# Patient Record
Sex: Female | Born: 1955 | Race: Black or African American | Hispanic: No | Marital: Single | Smoking: Former smoker
Health system: Southern US, Community
[De-identification: ages and names within clinical notes are randomized; demographics above are authoritative.]

## PROBLEM LIST (undated history)

## (undated) DIAGNOSIS — K222 Esophageal obstruction: Secondary | ICD-10-CM

## (undated) DIAGNOSIS — J309 Allergic rhinitis, unspecified: Secondary | ICD-10-CM

## (undated) DIAGNOSIS — T7840XA Allergy, unspecified, initial encounter: Secondary | ICD-10-CM

## (undated) DIAGNOSIS — J99 Respiratory disorders in diseases classified elsewhere: Secondary | ICD-10-CM

## (undated) DIAGNOSIS — B749 Filariasis, unspecified: Secondary | ICD-10-CM

## (undated) DIAGNOSIS — N390 Urinary tract infection, site not specified: Secondary | ICD-10-CM

## (undated) DIAGNOSIS — F32A Depression, unspecified: Secondary | ICD-10-CM

## (undated) DIAGNOSIS — F329 Major depressive disorder, single episode, unspecified: Secondary | ICD-10-CM

## (undated) DIAGNOSIS — K5140 Inflammatory polyps of colon without complications: Secondary | ICD-10-CM

## (undated) DIAGNOSIS — K219 Gastro-esophageal reflux disease without esophagitis: Secondary | ICD-10-CM

## (undated) DIAGNOSIS — R011 Cardiac murmur, unspecified: Secondary | ICD-10-CM

## (undated) HISTORY — DX: Allergy, unspecified, initial encounter: T78.40XA

## (undated) HISTORY — DX: Cardiac murmur, unspecified: R01.1

## (undated) HISTORY — DX: Respiratory disorders in diseases classified elsewhere: J99

## (undated) HISTORY — DX: Esophageal obstruction: K22.2

## (undated) HISTORY — DX: Major depressive disorder, single episode, unspecified: F32.9

## (undated) HISTORY — PX: UPPER GASTROINTESTINAL ENDOSCOPY: SHX188

## (undated) HISTORY — DX: Filariasis, unspecified: B74.9

## (undated) HISTORY — DX: Allergic rhinitis, unspecified: J30.9

## (undated) HISTORY — DX: Depression, unspecified: F32.A

## (undated) HISTORY — DX: Gastro-esophageal reflux disease without esophagitis: K21.9

## (undated) HISTORY — DX: Inflammatory polyps of colon without complications: K51.40

## (undated) HISTORY — DX: Urinary tract infection, site not specified: N39.0

## (undated) HISTORY — PX: COLONOSCOPY: SHX174

---
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
## 1998-10-13 ENCOUNTER — Encounter: Admission: RE | Admit: 1998-10-13 | Discharge: 1998-10-13 | Payer: Self-pay | Admitting: *Deleted

## 1999-08-10 ENCOUNTER — Emergency Department (HOSPITAL_COMMUNITY): Admission: EM | Admit: 1999-08-10 | Discharge: 1999-08-10 | Payer: Self-pay | Admitting: Emergency Medicine

## 1999-08-13 ENCOUNTER — Encounter: Admission: RE | Admit: 1999-08-13 | Discharge: 1999-08-13 | Payer: Self-pay | Admitting: Internal Medicine

## 1999-12-21 ENCOUNTER — Other Ambulatory Visit: Admission: RE | Admit: 1999-12-21 | Discharge: 1999-12-21 | Payer: Self-pay | Admitting: Obstetrics and Gynecology

## 2001-08-28 ENCOUNTER — Other Ambulatory Visit: Admission: RE | Admit: 2001-08-28 | Discharge: 2001-08-28 | Payer: Self-pay | Admitting: Obstetrics and Gynecology

## 2001-09-10 ENCOUNTER — Encounter: Payer: Self-pay | Admitting: Obstetrics and Gynecology

## 2001-09-10 ENCOUNTER — Ambulatory Visit (HOSPITAL_COMMUNITY): Admission: RE | Admit: 2001-09-10 | Discharge: 2001-09-10 | Payer: Self-pay | Admitting: Obstetrics and Gynecology

## 2003-05-05 ENCOUNTER — Emergency Department (HOSPITAL_COMMUNITY): Admission: EM | Admit: 2003-05-05 | Discharge: 2003-05-05 | Payer: Self-pay | Admitting: Emergency Medicine

## 2003-05-06 ENCOUNTER — Emergency Department (HOSPITAL_COMMUNITY): Admission: EM | Admit: 2003-05-06 | Discharge: 2003-05-06 | Payer: Self-pay | Admitting: Emergency Medicine

## 2003-05-13 ENCOUNTER — Encounter: Payer: Self-pay | Admitting: Gastroenterology

## 2003-05-13 ENCOUNTER — Ambulatory Visit (HOSPITAL_COMMUNITY): Admission: RE | Admit: 2003-05-13 | Discharge: 2003-05-13 | Payer: Self-pay | Admitting: Gastroenterology

## 2003-05-13 ENCOUNTER — Encounter (INDEPENDENT_AMBULATORY_CARE_PROVIDER_SITE_OTHER): Payer: Self-pay

## 2003-10-01 ENCOUNTER — Other Ambulatory Visit: Admission: RE | Admit: 2003-10-01 | Discharge: 2003-10-01 | Payer: Self-pay | Admitting: Obstetrics and Gynecology

## 2004-11-24 ENCOUNTER — Other Ambulatory Visit: Admission: RE | Admit: 2004-11-24 | Discharge: 2004-11-24 | Payer: Self-pay | Admitting: Obstetrics and Gynecology

## 2005-12-02 ENCOUNTER — Other Ambulatory Visit: Admission: RE | Admit: 2005-12-02 | Discharge: 2005-12-02 | Payer: Self-pay | Admitting: Obstetrics and Gynecology

## 2007-11-09 ENCOUNTER — Emergency Department (HOSPITAL_COMMUNITY): Admission: EM | Admit: 2007-11-09 | Discharge: 2007-11-09 | Payer: Self-pay | Admitting: Family Medicine

## 2008-05-21 ENCOUNTER — Emergency Department (HOSPITAL_COMMUNITY): Admission: EM | Admit: 2008-05-21 | Discharge: 2008-05-21 | Payer: Self-pay | Admitting: Emergency Medicine

## 2008-10-10 DIAGNOSIS — K5140 Inflammatory polyps of colon without complications: Secondary | ICD-10-CM

## 2008-10-10 HISTORY — DX: Inflammatory polyps of colon without complications: K51.40

## 2008-10-20 ENCOUNTER — Ambulatory Visit: Payer: Self-pay | Admitting: Gastroenterology

## 2008-11-03 ENCOUNTER — Ambulatory Visit: Payer: Self-pay | Admitting: Gastroenterology

## 2008-11-03 ENCOUNTER — Encounter: Payer: Self-pay | Admitting: Gastroenterology

## 2008-11-05 ENCOUNTER — Encounter: Payer: Self-pay | Admitting: Gastroenterology

## 2010-01-11 ENCOUNTER — Encounter: Admission: RE | Admit: 2010-01-11 | Discharge: 2010-01-11 | Payer: Self-pay | Admitting: Family Medicine

## 2011-03-24 ENCOUNTER — Other Ambulatory Visit: Payer: Self-pay | Admitting: Obstetrics and Gynecology

## 2011-07-08 LAB — POCT URINALYSIS DIP (DEVICE)
Bilirubin Urine: NEGATIVE
Glucose, UA: NEGATIVE
Hgb urine dipstick: NEGATIVE
Ketones, ur: NEGATIVE
Nitrite: NEGATIVE
Operator id: 239701
Protein, ur: NEGATIVE
Specific Gravity, Urine: 1.01
Urobilinogen, UA: 0.2
pH: 6

## 2011-11-11 ENCOUNTER — Telehealth: Payer: Self-pay

## 2011-11-11 NOTE — Telephone Encounter (Signed)
Dr. Alwyn Ren: Pt wanted [place] to be aware that [facility] is trying to contact us about medication refills. cdc

## 2011-11-15 ENCOUNTER — Other Ambulatory Visit: Payer: Self-pay

## 2011-11-15 MED ORDER — ESOMEPRAZOLE MAGNESIUM 40 MG PO CPDR: 40.0000 mg | DELAYED_RELEASE_CAPSULE | Freq: Every day | ORAL | Status: DC

## 2012-03-01 ENCOUNTER — Encounter: Payer: Self-pay | Admitting: Family Medicine

## 2012-03-01 ENCOUNTER — Telehealth: Payer: Self-pay | Admitting: *Deleted

## 2012-03-01 ENCOUNTER — Ambulatory Visit (INDEPENDENT_AMBULATORY_CARE_PROVIDER_SITE_OTHER): Payer: BC Managed Care – PPO | Admitting: Family Medicine

## 2012-03-01 VITALS — BP 125/82 | HR 74 | Temp 98.2°F | Ht 61.0 in | Wt 133.4 lb

## 2012-03-01 DIAGNOSIS — R51 Headache: Secondary | ICD-10-CM

## 2012-03-01 DIAGNOSIS — J309 Allergic rhinitis, unspecified: Secondary | ICD-10-CM

## 2012-03-01 DIAGNOSIS — J302 Other seasonal allergic rhinitis: Secondary | ICD-10-CM | POA: Insufficient documentation

## 2012-03-01 DIAGNOSIS — R519 Headache, unspecified: Secondary | ICD-10-CM | POA: Insufficient documentation

## 2012-03-01 MED ORDER — PREDNISONE 20 MG PO TABS: ORAL_TABLET | ORAL | Status: DC

## 2012-03-01 MED ORDER — FLUTICASONE PROPIONATE 50 MCG/ACT NA SUSP: 2.0000 | Freq: Every day | NASAL | Status: DC

## 2012-03-01 NOTE — Assessment & Plan Note (Signed)
New.  No evidence of infxn on PE.  No abx.  Start Prednisone for inflammation.  Reviewed supportive care and red flags that should prompt return.  Pt expressed understanding and is in agreement w/ plan.

## 2012-03-01 NOTE — Telephone Encounter (Signed)
Called pt to clarify concern for medication prescribed today, unable to speak to pt, left vm to call office

## 2012-03-01 NOTE — Telephone Encounter (Signed)
Pt left msg on triage vmail with questions about the prescription she was prescribed today.

## 2012-03-01 NOTE — Progress Notes (Signed)
  Subjective:    Patient ID: Sandra Curtis, female    DOB: [birth year], 56 y.o.   MRN: [id number]  HPI New to establish.  Previous MD- Hooper ([facility]) GYN- [facility]  HAs- chronic problem, was prescribed Mobic, Fiorcet by [facility] w/out relief.  Was also started on Zoloft for preventative but pt did not take this.  Had CT scan last year for similar sxs and had sinus infxn.  sxs returned last week w/ severe, daily HA.  HA is behind the eyes.  Again having dizziness.  No ear pain.  + nasal congestion.  No fevers.  No nausea.  No photo or phonophobia.   Review of Systems For ROS see HPI     Objective:   Physical Exam  Vitals reviewed. Constitutional: She appears well-developed and well-nourished. No distress.  HENT:  Head: Normocephalic and atraumatic.  Right Ear: Tympanic membrane normal.  Left Ear: Tympanic membrane normal.  Nose: Mucosal edema and rhinorrhea present. Right sinus exhibits no maxillary sinus tenderness and no frontal sinus tenderness. Left sinus exhibits no maxillary sinus tenderness and no frontal sinus tenderness.  Mouth/Throat: Mucous membranes are normal. Posterior oropharyngeal erythema (w/ PND) present.  Eyes: Conjunctivae and EOM are normal. Pupils are equal, round, and reactive to light.  Neck: Normal range of motion. Neck supple.  Cardiovascular: Normal rate, regular rhythm, normal heart sounds and intact distal pulses.   Pulmonary/Chest: Effort normal and breath sounds normal. No respiratory distress. She has no wheezes. She has no rales.  Musculoskeletal: She exhibits no edema.  Lymphadenopathy:    She has no cervical adenopathy.          Assessment & Plan:

## 2012-03-01 NOTE — Patient Instructions (Signed)
Schedule your complete physical at your convenience- don't eat before this appt Start Claritin or Zyrtec daily for seasonal allergies Use the nasal spray- 2 sprays each nostril- daily Take the Prednisone- 2 tabs at the same time- w/ food to improve the pain/inflammation You can add tylenol throughout the day if your head hurts- don't take excedrin/aspirin/ibuprofen while on the prednisone Call with any questions or concerns Welcome!  We're glad to have you!!!

## 2012-03-01 NOTE — Assessment & Plan Note (Signed)
New.  Likely the cause of pt's daily HAs.  Start OTC antihistamine, steroid nasal spray.  Reviewed supportive care and red flags that should prompt return.  Pt expressed understanding and is in agreement w/ plan.

## 2012-03-07 NOTE — Telephone Encounter (Signed)
Called pt to clarify question about her prescription, left vm to call office

## 2012-03-13 NOTE — Telephone Encounter (Signed)
.  left message to have patient return my call Mailed letter to advise if the pt still has a concern with her prescription to please contact our office

## 2012-07-11 ENCOUNTER — Encounter: Payer: Self-pay | Admitting: Family Medicine

## 2012-07-11 ENCOUNTER — Ambulatory Visit (INDEPENDENT_AMBULATORY_CARE_PROVIDER_SITE_OTHER): Payer: BC Managed Care – PPO | Admitting: Family Medicine

## 2012-07-11 VITALS — BP 110/77 | HR 87 | Temp 98.4°F | Ht 61.0 in | Wt 128.2 lb

## 2012-07-11 DIAGNOSIS — Z0000 Encounter for general adult medical examination without abnormal findings: Secondary | ICD-10-CM | POA: Insufficient documentation

## 2012-07-11 LAB — LIPID PANEL: VLDL: 11.6 mg/dL (ref 0.0–40.0)

## 2012-07-11 LAB — HEPATIC FUNCTION PANEL
ALT: 18 U/L (ref 0–35)
AST: 32 U/L (ref 0–37)
Alkaline Phosphatase: 59 U/L (ref 39–117)
Bilirubin, Direct: 0.1 mg/dL (ref 0.0–0.3)
Total Protein: 7.5 g/dL (ref 6.0–8.3)

## 2012-07-11 LAB — CBC WITH DIFFERENTIAL/PLATELET
Basophils Relative: 0.9 % (ref 0.0–3.0)
Eosinophils Relative: 1.9 % (ref 0.0–5.0)
Hemoglobin: 11.3 g/dL — ABNORMAL LOW (ref 12.0–15.0)
Lymphocytes Relative: 54.6 % — ABNORMAL HIGH (ref 12.0–46.0)
Neutro Abs: 2.7 10*3/uL (ref 1.4–7.7)
Neutrophils Relative %: 36.4 % — ABNORMAL LOW (ref 43.0–77.0)
RBC: 3.71 Mil/uL — ABNORMAL LOW (ref 3.87–5.11)
WBC: 7.4 10*3/uL (ref 4.5–10.5)

## 2012-07-11 LAB — BASIC METABOLIC PANEL
BUN: 14 mg/dL (ref 6–23)
Calcium: 9 mg/dL (ref 8.4–10.5)
Creatinine, Ser: 0.7 mg/dL (ref 0.4–1.2)
GFR: 107.64 mL/min (ref >60.00)

## 2012-07-11 LAB — TSH: TSH: 0.53 u[IU]/mL (ref 0.35–5.50)

## 2012-07-11 NOTE — Progress Notes (Signed)
  Subjective:    Patient ID: Sandra Curtis, female    DOB: 1956-09-22, 56 y.o.   MRN: [id number]  HPI CPE- UTD on mammo and pap w/ Dr Tenny Craw (GYN), colonoscopy 2010 w/ Dr Russella Dar.   Review of Systems Patient reports no vision/ hearing changes, adenopathy,fever, weight change,  persistant/recurrent hoarseness , swallowing issues, chest pain, palpitations, edema, persistant/recurrent cough, hemoptysis, dyspnea (rest/exertional/paroxysmal nocturnal), gastrointestinal bleeding (melena, rectal bleeding), significant heartburn, bowel changes, GU symptoms (dysuria, hematuria, incontinence), Gyn symptoms (abnormal  bleeding, pain),  syncope, focal weakness, memory loss, numbness & tingling, skin/hair/nail changes, abnormal bruising or bleeding, anxiety, or depression.   + chronic constipation, gas pain    Objective:   Physical Exam General Appearance:    Alert, cooperative, no distress, appears stated age  Head:    Normocephalic, without obvious abnormality, atraumatic  Eyes:    PERRL, conjunctiva/corneas clear, EOM's intact, fundi    benign, both eyes  Ears:    Normal TM's and external ear canals, both ears  Nose:   Nares normal, septum midline, mucosa normal, no drainage    or sinus tenderness  Throat:   Lips, mucosa, and tongue normal; teeth and gums normal  Neck:   Supple, symmetrical, trachea midline, no adenopathy;    Thyroid: no enlargement/tenderness/nodules  Back:     Symmetric, no curvature, ROM normal, no CVA tenderness  Lungs:     Clear to auscultation bilaterally, respirations unlabored  Chest Wall:    No tenderness or deformity   Heart:    Regular rate and rhythm, S1 and S2 normal, no murmur, rub   or gallop  Breast Exam:    Deferred to GYN  Abdomen:     Soft, non-tender, bowel sounds active all four quadrants,    no masses, no organomegaly  Genitalia:    Deferred to GYN  Rectal:    Extremities:   Extremities normal, atraumatic, no cyanosis or edema  Pulses:   2+ and  symmetric all extremities  Skin:   Skin color, texture, turgor normal, no rashes or lesions  Lymph nodes:   Cervical, supraclavicular, and axillary nodes normal  Neurologic:   CNII-XII intact, normal strength, sensation and reflexes    throughout          Assessment & Plan:

## 2012-07-11 NOTE — Patient Instructions (Addendum)
Follow up in 1 year or as needed Keep up the good work! We'll notify you of your lab results and make any changes if needed Call with any questions or concerns Happy Fall!!!

## 2012-07-11 NOTE — Assessment & Plan Note (Signed)
Pt's PE WNL.  UTD on colonoscopy, GYN.  Declined flu shot.  EKG done- see document for interpretation.  Check labs.  Anticipatory guidance provided.

## 2012-07-12 ENCOUNTER — Encounter: Payer: Self-pay | Admitting: *Deleted

## 2012-07-15 LAB — VITAMIN D 1,25 DIHYDROXY
Vitamin D 1, 25 (OH)2 Total: 72 pg/mL (ref 18–72)
Vitamin D2 1, 25 (OH)2: <8 pg/mL
Vitamin D3 1, 25 (OH)2: 72 pg/mL

## 2012-10-10 LAB — HM COLONOSCOPY

## 2013-04-17 ENCOUNTER — Other Ambulatory Visit: Payer: Self-pay | Admitting: Obstetrics and Gynecology

## 2013-08-08 ENCOUNTER — Ambulatory Visit: Payer: Self-pay | Admitting: Family Medicine

## 2013-10-01 ENCOUNTER — Telehealth: Payer: Self-pay

## 2013-10-01 NOTE — Telephone Encounter (Signed)
Left message for call back Non identifiable  Pap--04/2013--neg--Dr Ross [facility]--07/2012--per patient [facility]--10/2008--Dr Stark--next due--2020

## 2013-10-02 ENCOUNTER — Ambulatory Visit (INDEPENDENT_AMBULATORY_CARE_PROVIDER_SITE_OTHER): Payer: BC Managed Care – PPO | Admitting: Family Medicine

## 2013-10-02 ENCOUNTER — Encounter: Payer: Self-pay | Admitting: Family Medicine

## 2013-10-02 VITALS — BP 118/80 | HR 90 | Temp 98.4°F | Resp 16 | Ht 61.0 in | Wt 137.4 lb

## 2013-10-02 DIAGNOSIS — Z0000 Encounter for general adult medical examination without abnormal findings: Secondary | ICD-10-CM

## 2013-10-02 DIAGNOSIS — R109 Unspecified abdominal pain: Secondary | ICD-10-CM

## 2013-10-02 DIAGNOSIS — N390 Urinary tract infection, site not specified: Secondary | ICD-10-CM | POA: Insufficient documentation

## 2013-10-02 DIAGNOSIS — R011 Cardiac murmur, unspecified: Secondary | ICD-10-CM | POA: Insufficient documentation

## 2013-10-02 DIAGNOSIS — I351 Nonrheumatic aortic (valve) insufficiency: Secondary | ICD-10-CM | POA: Insufficient documentation

## 2013-10-02 LAB — CBC WITH DIFFERENTIAL/PLATELET
Basophils Absolute: 0 10*3/uL (ref 0.0–0.1)
Basophils Relative: 0.6 % (ref 0.0–3.0)
Eosinophils Absolute: 0.1 10*3/uL (ref 0.0–0.7)
Hemoglobin: 10.8 g/dL — ABNORMAL LOW (ref 12.0–15.0)
Lymphocytes Relative: 44 % (ref 12.0–46.0)
Lymphs Abs: 3.3 10*3/uL (ref 0.7–4.0)
MCHC: 33 g/dL (ref 30.0–36.0)
MCV: 90.3 fl (ref 78.0–100.0)
Monocytes Absolute: 0.5 10*3/uL (ref 0.1–1.0)
Neutro Abs: 3.5 10*3/uL (ref 1.4–7.7)
RBC: 3.63 Mil/uL — ABNORMAL LOW (ref 3.87–5.11)
RDW: 14 % (ref 11.5–14.6)

## 2013-10-02 LAB — POCT URINALYSIS DIPSTICK
Protein, UA: NEGATIVE
Spec Grav, UA: <=1.005
Urobilinogen, UA: 0.2

## 2013-10-02 LAB — HEPATIC FUNCTION PANEL
ALT: 14 U/L (ref 0–35)
Bilirubin, Direct: 0 mg/dL (ref 0.0–0.3)
Total Bilirubin: 0.3 mg/dL (ref 0.3–1.2)

## 2013-10-02 LAB — BASIC METABOLIC PANEL
BUN: 23 mg/dL (ref 6–23)
Calcium: 9.4 mg/dL (ref 8.4–10.5)
Chloride: 104 mEq/L (ref 96–112)
Creatinine, Ser: 0.9 mg/dL (ref 0.4–1.2)
GFR: 82.84 mL/min (ref >60.00)
Glucose, Bld: 93 mg/dL (ref 70–99)
Sodium: 138 mEq/L (ref 135–145)

## 2013-10-02 LAB — LIPID PANEL
Cholesterol: 190 mg/dL (ref 0–200)
LDL Cholesterol: 113 mg/dL — ABNORMAL HIGH (ref 0–99)
Triglycerides: 87 mg/dL (ref 0.0–149.0)
VLDL: 17.4 mg/dL (ref 0.0–40.0)

## 2013-10-02 LAB — TSH: TSH: 0.42 u[IU]/mL (ref 0.35–5.50)

## 2013-10-02 MED ORDER — FLUTICASONE PROPIONATE 50 MCG/ACT NA SUSP: 2.0000 | Freq: Every day | NASAL | Status: DC

## 2013-10-02 MED ORDER — CEPHALEXIN 500 MG PO CAPS: 500.0000 mg | ORAL_CAPSULE | Freq: Two times a day (BID) | ORAL | Status: AC

## 2013-10-02 NOTE — Progress Notes (Signed)
Pre visit review using our clinic review tool, if applicable. No additional management support is needed unless otherwise documented below in the visit note. 

## 2013-10-02 NOTE — Assessment & Plan Note (Signed)
Pt's PE WNL w/ exception of new heart murmur.  UTD on GYN and colonoscopy.  Check labs.  Anticipatory guidance provided.

## 2013-10-02 NOTE — Assessment & Plan Note (Signed)
New.  UA consistent w/ infxn.  Start abx.  Await culture results

## 2013-10-02 NOTE — Patient Instructions (Signed)
Schedule your complete physical in 1 year We'll notify you of your lab results and make any changes if needed We'll call you with the [place] appt of your heart to check on the murmur Start the Keflex as directed for the UTI  Drink plenty of fluids Call with any questions or concerns Happy Holidays!!

## 2013-10-02 NOTE — Assessment & Plan Note (Signed)
New.  Get ECHO to assess.

## 2013-10-02 NOTE — Progress Notes (Signed)
   Subjective:    Patient ID: Sandra Curtis, female    DOB: 1956/09/18, 57 y.o.   MRN: [id number]  HPI CPE- UTD on GYN, colonoscopy.   Review of Systems Patient reports no vision/ hearing changes, adenopathy,fever, weight change, persistant/recurrent hoarseness, swallowing issues, chest pain, palpitations, edema, persistant/recurrent cough, hemoptysis, dyspnea (rest/exertional/paroxysmal nocturnal), gastrointestinal bleeding (melena, rectal bleeding), abdominal pain, significant heartburn, bowel changes, Gyn symptoms (abnormal  bleeding, pain), syncope, focal weakness, memory loss, numbness & tingling, skin/hair/nail changes, abnormal bruising or bleeding, anxiety, or depression.  + urinary hesitancy, dysuria, frequency, suprapubic cramping    Objective:   Physical Exam General Appearance:    Alert, cooperative, no distress, appears stated age  Head:    Normocephalic, without obvious abnormality, atraumatic  Eyes:    PERRL, conjunctiva/corneas clear, EOM's intact, fundi    benign, both eyes  Ears:    Normal TM's and external ear canals, both ears  Nose:   Nares normal, septum midline, mucosa normal, no drainage    or sinus tenderness  Throat:   Lips, mucosa, and tongue normal; teeth and gums normal  Neck:   Supple, symmetrical, trachea midline, no adenopathy;    Thyroid: no enlargement/tenderness/nodules  Back:     Symmetric, no curvature, ROM normal, no CVA tenderness  Lungs:     Clear to auscultation bilaterally, respirations unlabored  Chest Wall:    No tenderness or deformity   Heart:    Regular rate and rhythm, S1 and S2 normal, II/VI SEM heard best over RUSB, no rub or gallop  Breast Exam:    Deferred to GYN  Abdomen:     Soft, non-tender, bowel sounds active all four quadrants,    no masses, no organomegaly  Genitalia:    Deferred to GYN  Rectal:    Extremities:   Extremities normal, atraumatic, no cyanosis or edema  Pulses:   2+ and symmetric all extremities  Skin:    Skin color, texture, turgor normal, no rashes or lesions  Lymph nodes:   Cervical, supraclavicular, and axillary nodes normal  Neurologic:   CNII-XII intact, normal strength, sensation and reflexes    throughout          Assessment & Plan:

## 2013-10-04 ENCOUNTER — Telehealth: Payer: Self-pay | Admitting: *Deleted

## 2013-10-04 LAB — URINE CULTURE: Colony Count: >=100000

## 2013-10-04 NOTE — Telephone Encounter (Signed)
Unable to reach pre visit.  

## 2013-10-04 NOTE — Telephone Encounter (Signed)
Patient called and stated that she was seen on 10/02/2013 for a CPE. Patient states that she was given Flonase. Patient states that she already had the nasal spray but needed some pills called in for her sinus. Asked patient if she had mention to the provier on her visit that she had sinus infection. Patient replied "yes, but I don't think she heard me I said I already had the nasal spray". Please advise. SW

## 2013-10-04 NOTE — Telephone Encounter (Signed)
Patient made aware and states that she will be in on Monday to pick up iFOB.

## 2013-10-04 NOTE — Telephone Encounter (Signed)
Pt did not have an infxn at time of visit- had congestion (which is why I refilled the nasal spray).  Should add claritin or zyrtec daily

## 2013-10-04 NOTE — Telephone Encounter (Signed)
Given pt's anemia and the fact that she is not having menstrual cycles- needs to do iFOB to r/o GI blood loss

## 2013-10-06 LAB — VITAMIN D 1,25 DIHYDROXY
Vitamin D 1, 25 (OH)2 Total: 62 pg/mL (ref 18–72)
Vitamin D2 1, 25 (OH)2: <8 pg/mL
Vitamin D3 1, 25 (OH)2: 62 pg/mL

## 2013-10-07 ENCOUNTER — Encounter: Payer: Self-pay | Admitting: General Practice

## 2013-10-22 ENCOUNTER — Encounter: Payer: Self-pay | Admitting: General Practice

## 2013-10-22 ENCOUNTER — Encounter: Payer: Self-pay | Admitting: Cardiovascular Disease

## 2013-10-22 ENCOUNTER — Ambulatory Visit (HOSPITAL_COMMUNITY): Payer: BC Managed Care – PPO | Attending: Family Medicine | Admitting: Radiology

## 2013-10-22 DIAGNOSIS — Z87891 Personal history of nicotine dependence: Secondary | ICD-10-CM | POA: Insufficient documentation

## 2013-10-22 DIAGNOSIS — R011 Cardiac murmur, unspecified: Secondary | ICD-10-CM | POA: Insufficient documentation

## 2013-10-22 DIAGNOSIS — I359 Nonrheumatic aortic valve disorder, unspecified: Secondary | ICD-10-CM | POA: Insufficient documentation

## 2013-10-22 NOTE — Progress Notes (Signed)
Echocardiogram performed.  

## 2013-11-11 ENCOUNTER — Ambulatory Visit (INDEPENDENT_AMBULATORY_CARE_PROVIDER_SITE_OTHER): Payer: BC Managed Care – PPO | Admitting: Gastroenterology

## 2013-11-11 ENCOUNTER — Encounter: Payer: Self-pay | Admitting: Gastroenterology

## 2013-11-11 VITALS — BP 120/62 | HR 68 | Ht 61.0 in | Wt 137.0 lb

## 2013-11-11 DIAGNOSIS — R195 Other fecal abnormalities: Secondary | ICD-10-CM

## 2013-11-11 DIAGNOSIS — K219 Gastro-esophageal reflux disease without esophagitis: Secondary | ICD-10-CM

## 2013-11-11 DIAGNOSIS — K5900 Constipation, unspecified: Secondary | ICD-10-CM

## 2013-11-11 MED ORDER — PEG-KCL-NACL-NASULF-NA ASC-C 100 G PO SOLR
1.0000 | Freq: Once | ORAL | Status: DC
Start: 2013-11-11 — End: 2014-01-03

## 2013-11-11 NOTE — Patient Instructions (Signed)
You have been scheduled for a colonoscopy with propofol. Please follow written instructions given to you at your visit today.  Please pick up your prep kit at the pharmacy within the next 1-3 days. If you use inhalers (even only as needed), please bring them with you on the day of your procedure.  Patient advised to avoid spicy, acidic, citrus, chocolate, mints, fruit and fruit juices.  Limit the intake of caffeine, alcohol and Soda.  Don't exercise too soon after eating.  Don't lie down within 3-4 hours of eating.  Elevate the head of your bed.  You can use Miralax over the counter mixing 17 gram sin 8 oz of water daily for constipation.  Thank you for choosing me and [facility].  Pricilla Riffle. Dagoberto Ligas., MD., Marval Regal

## 2013-11-11 NOTE — Progress Notes (Signed)
    History of Present Illness: This is a 58 year old female who states she had Hemoccult-positive stool found on DRE by Dr. Tenny Crawoss a few months ago. She has chronic constipation which has slightly improved using [employer] [facility]. She underwent colonoscopy in January 2010 showing a small hyperplastic rectal polyp. She relates occasional breakthrough reflux symptoms. Denies weight loss, abdominal pain, diarrhea, change in stool caliber, melena, hematochezia, nausea, vomiting, dysphagia, chest pain.  Review of Systems: Pertinent positive and negative review of systems were noted in the above HPI section. All other review of systems were otherwise negative.  Current Medications, Allergies, Past Medical History, Past Surgical History, Family History and Social History were reviewed in [facility] electronic medical record.  Physical Exam: General: Well developed , well nourished, no acute distress Head: Normocephalic and atraumatic Eyes:  sclerae anicteric, EOMI Ears: Normal auditory acuity Mouth: No deformity or lesions Neck: Supple, no masses or thyromegaly Lungs: Clear throughout to auscultation Heart: Regular rate and rhythm; no murmurs, rubs or bruits Abdomen: Soft, non tender and non distended. No masses, hepatosplenomegaly or hernias noted. Normal Bowel sounds Rectal: Deferred to colonoscopy Musculoskeletal: Symmetrical with no gross deformities  Skin: No lesions on visible extremities Pulses:  Normal pulses noted Extremities: No clubbing, cyanosis, edema or deformities noted Neurological: Alert oriented x 4, grossly nonfocal Cervical Nodes:  No significant cervical adenopathy Inguinal Nodes: No significant inguinal adenopathy Psychological:  Alert and cooperative. Normal mood and affect  Assessment and Recommendations:  1. Heme + stool. Rule out colorectal neoplasms. The risks, benefits, and alternatives to colonoscopy with possible biopsy and possible polypectomy were  discussed with the patient and they consent to proceed.   2. Chronic constipation. Increase dietary fiber and water intake. MiraLax once or twice daily titrated for adequate bowel movements.  3. GERD. Continue Nexium 40 mg daily. Intensify all antireflux measures..Marland Kitchen

## 2013-11-13 ENCOUNTER — Encounter: Payer: Self-pay | Admitting: Gastroenterology

## 2014-01-03 ENCOUNTER — Ambulatory Visit (AMBULATORY_SURGERY_CENTER): Payer: BC Managed Care – PPO | Admitting: Gastroenterology

## 2014-01-03 ENCOUNTER — Encounter: Payer: Self-pay | Admitting: Gastroenterology

## 2014-01-03 VITALS — BP 124/72 | HR 63 | Temp 97.5°F | Resp 16 | Ht 61.0 in | Wt 137.0 lb

## 2014-01-03 DIAGNOSIS — R195 Other fecal abnormalities: Secondary | ICD-10-CM

## 2014-01-03 MED ORDER — SODIUM CHLORIDE 0.9 % IV SOLN: 500.0000 mL | INTRAVENOUS | Status: DC

## 2014-01-03 NOTE — Progress Notes (Signed)
Pt stable to RR 

## 2014-01-03 NOTE — Op Note (Signed)
[facility] [address] [place], [id number]   COLONOSCOPY PROCEDURE REPORT  PATIENT: Sandra Curtis, Sandra G.  MR#: [id number] BIRTHDATE: 18-Jan-1956 , 57  yrs. old GENDER: Female ENDOSCOPIST: Meryl DareMalcolm T Manish Ruggiero, MD, [facility] REFERRED XB:JYNWGBY:Allan Ross, M.D. PROCEDURE DATE:  01/03/2014 PROCEDURE:   Colonoscopy, diagnostic First Screening Colonoscopy - Avg.  risk and is 50 yrs.  old or older - No.  Prior Negative Screening - Now for repeat screening. N/A  History of Adenoma - Now for follow-up colonoscopy & has been > or = to 3 yrs.  N/A  Polyps Removed Today? No.  Recommend repeat exam, <10 yrs? No. ASA CLASS:   Class II INDICATIONS:heme-positive stool. MEDICATIONS: MAC sedation, administered by CRNA and propofol (Diprivan) 200mg  IV DESCRIPTION OF PROCEDURE:   After the risks benefits and alternatives of the procedure were thoroughly explained, informed consent was obtained.  A digital rectal exam revealed no abnormalities of the rectum.   The LB [id number]  endoscope was introduced through the anus and advanced to the cecum, which was identified by both the appendix and ileocecal valve. No adverse events experienced.   The quality of the prep was excellent, using MoviPrep  The instrument was then slowly withdrawn as the colon was fully examined.  COLON FINDINGS: A normal appearing cecum, ileocecal valve, and appendiceal orifice were identified.  The ascending, hepatic flexure, transverse, splenic flexure, descending, sigmoid colon and rectum appeared unremarkable.  No polyps or cancers were seen. Retroflexed views revealed small internal hemorrhoids. The time to cecum=3 minutes 59 seconds.  Withdrawal time=12 minutes 02 seconds. The scope was withdrawn and the procedure completed.  COMPLICATIONS: There were no complications.  ENDOSCOPIC IMPRESSION: 1.  Normal colon 2.  Small internal hemorrhoids  RECOMMENDATIONS: 1.  Continue to follow colorectal cancer  screening guidelines for "routine risk" patients with a repeat colonoscopy in 10 years. There is no need for routine, screening FOBT (stool) testing for at least 5 years.  eSigned:  Meryl DareMalcolm T Alene Bergerson, MD, [facility] 01/03/2014 10:14 AM

## 2014-01-03 NOTE — Patient Instructions (Signed)
YOU HAD AN ENDOSCOPIC PROCEDURE TODAY AT THE [facility]: Refer to the procedure report that was given to you for any specific questions about what was found during the examination.  If the procedure report does not answer your questions, please call your gastroenterologist to clarify.  If you requested that your care partner not be given the details of your procedure findings, then the procedure report has been included in a sealed envelope for you to review at your convenience later.  YOU SHOULD EXPECT: Some feelings of bloating in the abdomen. Passage of more gas than usual.  Walking can help get rid of the air that was put into your GI tract during the procedure and reduce the bloating. If you had a lower endoscopy (such as a colonoscopy or flexible sigmoidoscopy) you may notice spotting of blood in your stool or on the toilet paper. If you underwent a bowel prep for your procedure, then you may not have a normal bowel movement for a few days.  DIET: Your first meal following the procedure should be a light meal and then it is ok to progress to your normal diet.  A half-sandwich or bowl of soup is an example of a good first meal.  Heavy or fried foods are harder to digest and may make you feel nauseous or bloated.  Likewise meals heavy in dairy and vegetables can cause extra gas to form and this can also increase the bloating.  Drink plenty of fluids but you should avoid alcoholic beverages for 24 hours.  ACTIVITY: Your care partner should take you home directly after the procedure.  You should plan to take it easy, moving slowly for the rest of the day.  You can resume normal activity the day after the procedure however you should NOT DRIVE or use heavy machinery for 24 hours (because of the sedation medicines used during the test).    SYMPTOMS TO REPORT IMMEDIATELY: A gastroenterologist can be reached at any hour.  During normal business hours, 8:30 AM to 5:00 PM Monday through Friday,  call [phone].  After hours and on weekends, please call the GI answering service at [phone] who will take a message and have the physician on call contact you.   Following lower endoscopy (colonoscopy or flexible sigmoidoscopy):  Excessive amounts of blood in the stool  Significant tenderness or worsening of abdominal pains  Swelling of the abdomen that is new, acute  Fever of 100F or higher  FOLLOW UP: If any biopsies were taken you will be contacted by phone or by letter within the next 1-3 weeks.  Call your gastroenterologist if you have not heard about the biopsies in 3 weeks.  Our staff will call the home number listed on your records the next business day following your procedure to check on you and address any questions or concerns that you may have at that time regarding the information given to you following your procedure. This is a courtesy call and so if there is no answer at the home number and we have not heard from you through the emergency physician on call, we will assume that you have returned to your regular daily activities without incident.  SIGNATURES/CONFIDENTIALITY: You and/or your care partner have signed paperwork which will be entered into your electronic medical record.  These signatures attest to the fact that that the information above on your After Visit Summary has been reviewed and is understood.  Full responsibility of the confidentiality of this   discharge information lies with you and/or your care-partner.    Handouts were given to your care partner on hemorrhoids and a high fiber diet with liberal fluid intake. You may resume your current medications today. Please call if any questions or concerns.       

## 2014-01-03 NOTE — Progress Notes (Signed)
No complaints noted in the recovery room. Maw   

## 2014-01-06 ENCOUNTER — Telehealth: Payer: Self-pay | Admitting: *Deleted

## 2014-01-06 NOTE — Telephone Encounter (Signed)
  Follow up Call-  Call back number 01/03/2014  Post procedure Call Back phone  # [phone]  Permission to leave phone message Yes     Patient questions:  Message left to call if necessary.

## 2014-05-21 ENCOUNTER — Other Ambulatory Visit: Payer: Self-pay | Admitting: Obstetrics and Gynecology

## 2014-05-22 LAB — CYTOLOGY - PAP

## 2015-03-25 ENCOUNTER — Encounter: Payer: Self-pay | Admitting: Gastroenterology

## 2016-06-10 ENCOUNTER — Other Ambulatory Visit: Payer: Self-pay | Admitting: Obstetrics

## 2016-06-10 DIAGNOSIS — R928 Other abnormal and inconclusive findings on diagnostic imaging of breast: Secondary | ICD-10-CM

## 2016-06-21 ENCOUNTER — Ambulatory Visit
Admission: RE | Admit: 2016-06-21 | Discharge: 2016-06-21 | Disposition: A | Discharge status: Home / Self Care | Payer: BLUE CROSS/BLUE SHIELD | Source: Ambulatory Visit | Attending: Obstetrics | Admitting: Obstetrics

## 2016-06-21 DIAGNOSIS — R928 Other abnormal and inconclusive findings on diagnostic imaging of breast: Secondary | ICD-10-CM

## 2016-11-28 ENCOUNTER — Ambulatory Visit: Payer: BLUE CROSS/BLUE SHIELD | Admitting: Gastroenterology

## 2016-11-28 ENCOUNTER — Telehealth: Payer: Self-pay | Admitting: Gastroenterology

## 2016-11-28 NOTE — Telephone Encounter (Signed)
charge 

## 2017-02-06 ENCOUNTER — Encounter: Payer: Self-pay | Admitting: Nurse Practitioner

## 2017-02-06 ENCOUNTER — Other Ambulatory Visit (INDEPENDENT_AMBULATORY_CARE_PROVIDER_SITE_OTHER): Payer: BLUE CROSS/BLUE SHIELD

## 2017-02-06 ENCOUNTER — Ambulatory Visit (INDEPENDENT_AMBULATORY_CARE_PROVIDER_SITE_OTHER)
Admission: RE | Admit: 2017-02-06 | Discharge: 2017-02-06 | Disposition: A | Discharge status: Home / Self Care | Payer: BLUE CROSS/BLUE SHIELD | Source: Ambulatory Visit | Attending: Nurse Practitioner | Admitting: Nurse Practitioner

## 2017-02-06 ENCOUNTER — Ambulatory Visit (INDEPENDENT_AMBULATORY_CARE_PROVIDER_SITE_OTHER): Payer: BLUE CROSS/BLUE SHIELD | Admitting: Nurse Practitioner

## 2017-02-06 VITALS — BP 122/70 | HR 67 | Temp 97.8°F | Ht 61.5 in | Wt 141.0 lb

## 2017-02-06 DIAGNOSIS — K2100 Gastro-esophageal reflux disease with esophagitis, without bleeding: Secondary | ICD-10-CM

## 2017-02-06 DIAGNOSIS — Z1159 Encounter for screening for other viral diseases: Secondary | ICD-10-CM | POA: Diagnosis not present

## 2017-02-06 DIAGNOSIS — R1310 Dysphagia, unspecified: Secondary | ICD-10-CM

## 2017-02-06 DIAGNOSIS — G8929 Other chronic pain: Secondary | ICD-10-CM | POA: Diagnosis not present

## 2017-02-06 DIAGNOSIS — M5137 Other intervertebral disc degeneration, lumbosacral region: Secondary | ICD-10-CM | POA: Diagnosis not present

## 2017-02-06 DIAGNOSIS — K5900 Constipation, unspecified: Secondary | ICD-10-CM | POA: Diagnosis not present

## 2017-02-06 DIAGNOSIS — M419 Scoliosis, unspecified: Secondary | ICD-10-CM

## 2017-02-06 DIAGNOSIS — Z114 Encounter for screening for human immunodeficiency virus [HIV]: Secondary | ICD-10-CM | POA: Diagnosis not present

## 2017-02-06 DIAGNOSIS — G4700 Insomnia, unspecified: Secondary | ICD-10-CM

## 2017-02-06 DIAGNOSIS — M545 Low back pain, unspecified: Secondary | ICD-10-CM

## 2017-02-06 DIAGNOSIS — Z13220 Encounter for screening for lipoid disorders: Secondary | ICD-10-CM | POA: Diagnosis not present

## 2017-02-06 DIAGNOSIS — Z136 Encounter for screening for cardiovascular disorders: Secondary | ICD-10-CM

## 2017-02-06 DIAGNOSIS — M51379 Other intervertebral disc degeneration, lumbosacral region without mention of lumbar back pain or lower extremity pain: Secondary | ICD-10-CM | POA: Insufficient documentation

## 2017-02-06 DIAGNOSIS — F329 Major depressive disorder, single episode, unspecified: Secondary | ICD-10-CM | POA: Insufficient documentation

## 2017-02-06 DIAGNOSIS — F32A Depression, unspecified: Secondary | ICD-10-CM | POA: Insufficient documentation

## 2017-02-06 HISTORY — DX: Other chronic pain: G89.29

## 2017-02-06 HISTORY — DX: Low back pain, unspecified: M54.50

## 2017-02-06 LAB — COMPREHENSIVE METABOLIC PANEL
ALBUMIN: 4.5 g/dL (ref 3.5–5.2)
ALK PHOS: 68 U/L (ref 39–117)
ALT: 21 U/L (ref 0–35)
AST: 28 U/L (ref 0–37)
BUN: 14 mg/dL (ref 6–23)
CALCIUM: 10 mg/dL (ref 8.4–10.5)
CHLORIDE: 107 meq/L (ref 96–112)
CO2: 29 mEq/L (ref 19–32)
Creatinine, Ser: 1.06 mg/dL (ref 0.40–1.20)
GFR: 67.8 mL/min (ref >60.00)
Glucose, Bld: 102 mg/dL — ABNORMAL HIGH (ref 70–99)
POTASSIUM: 4.4 meq/L (ref 3.5–5.1)
SODIUM: 143 meq/L (ref 135–145)
TOTAL PROTEIN: 8 g/dL (ref 6.0–8.3)
Total Bilirubin: 0.3 mg/dL (ref 0.2–1.2)

## 2017-02-06 LAB — CBC WITH DIFFERENTIAL/PLATELET
BASOS PCT: 0.6 % (ref 0.0–3.0)
Basophils Absolute: 0.1 10*3/uL (ref 0.0–0.1)
Eosinophils Absolute: 0.4 10*3/uL (ref 0.0–0.7)
Eosinophils Relative: 4.7 % (ref 0.0–5.0)
HEMATOCRIT: 34.4 % — AB (ref 36.0–46.0)
HEMOGLOBIN: 11.4 g/dL — AB (ref 12.0–15.0)
LYMPHS PCT: 34.5 % (ref 12.0–46.0)
Lymphs Abs: 2.8 10*3/uL (ref 0.7–4.0)
MCHC: 33.3 g/dL (ref 30.0–36.0)
MCV: 92.1 fl (ref 78.0–100.0)
MONOS PCT: 8.2 % (ref 3.0–12.0)
Monocytes Absolute: 0.7 10*3/uL (ref 0.1–1.0)
Neutro Abs: 4.2 10*3/uL (ref 1.4–7.7)
Neutrophils Relative %: 52 % (ref 43.0–77.0)
Platelets: 259 10*3/uL (ref 150.0–400.0)
RBC: 3.73 Mil/uL — AB (ref 3.87–5.11)
RDW: 14.6 % (ref 11.5–15.5)
WBC: 8 10*3/uL (ref 4.0–10.5)

## 2017-02-06 LAB — LIPID PANEL
CHOLESTEROL: 173 mg/dL (ref 0–200)
HDL: 58.8 mg/dL (ref >39.00)
LDL Cholesterol: 102 mg/dL — ABNORMAL HIGH (ref 0–99)
NonHDL: 113.75
Total CHOL/HDL Ratio: 3
Triglycerides: 60 mg/dL (ref 0.0–149.0)
VLDL: 12 mg/dL (ref 0.0–40.0)

## 2017-02-06 LAB — TSH: TSH: 0.88 u[IU]/mL (ref 0.35–4.50)

## 2017-02-06 MED ORDER — OMEPRAZOLE 20 MG PO CPDR: 20.0000 mg | DELAYED_RELEASE_CAPSULE | Freq: Every day | ORAL | 2 refills | Status: DC

## 2017-02-06 MED ORDER — TIZANIDINE HCL 4 MG PO CAPS
4.0000 mg | ORAL_CAPSULE | Freq: Three times a day (TID) | ORAL | 0 refills | Status: DC | PRN
Start: 2017-02-06 — End: 2017-03-07

## 2017-02-06 MED ORDER — MELOXICAM 7.5 MG PO TABS: 7.5000 mg | ORAL_TABLET | Freq: Every day | ORAL | 1 refills | Status: DC | PRN

## 2017-02-06 MED ORDER — DIPHENHYDRAMINE HCL 25 MG PO TABS: ORAL_TABLET | ORAL | 0 refills | Status: DC

## 2017-02-06 MED ORDER — FAMOTIDINE 20 MG PO TABS: 20.0000 mg | ORAL_TABLET | Freq: Every day | ORAL | 2 refills | Status: DC

## 2017-02-06 MED ORDER — ACETAMINOPHEN 500 MG PO TABS: 1000.0000 mg | ORAL_TABLET | Freq: Three times a day (TID) | ORAL | 0 refills | Status: DC | PRN

## 2017-02-06 NOTE — Patient Instructions (Signed)
Go to basement for blood draw and x-ray. You will be called with results.   Heartburn Heartburn is a type of pain or discomfort that can happen in the throat or chest. It is often described as a burning pain. It may also cause a bad taste in the mouth. Heartburn may feel worse when you lie down or bend over, and it is often worse at night. Heartburn may be caused by stomach contents that move back up into the esophagus (reflux). Follow these instructions at home: Take these actions to decrease your discomfort and to help avoid complications. Diet   Follow a diet as recommended by your health care provider. This may involve avoiding foods and drinks such as:  Coffee and tea (with or without caffeine).  Drinks that contain alcohol.  Energy drinks and sports drinks.  Carbonated drinks or sodas.  Chocolate and cocoa.  Peppermint and mint flavorings.  Garlic and onions.  Horseradish.  Spicy and acidic foods, including peppers, chili powder, curry powder, vinegar, hot sauces, and barbecue sauce.  Citrus fruit juices and citrus fruits, such as oranges, lemons, and limes.  Tomato-based foods, such as red sauce, chili, salsa, and pizza with red sauce.  Fried and fatty foods, such as donuts, french fries, potato chips, and high-fat dressings.  High-fat meats, such as hot dogs and fatty cuts of red and white meats, such as rib eye steak, sausage, ham, and bacon.  High-fat dairy items, such as whole milk, butter, and cream cheese.  Eat small, frequent meals instead of large meals.  Avoid drinking large amounts of liquid with your meals.  Avoid eating meals during the 2-3 hours before bedtime.  Avoid lying down right after you eat.  Do not exercise right after you eat. General instructions   Pay attention to any changes in your symptoms.  Take over-the-counter and prescription medicines only as told by your health care provider. Do not take aspirin, ibuprofen, or other NSAIDs  unless your health care provider told you to do so.  Do not use any tobacco products, including cigarettes, chewing tobacco, and e-cigarettes. If you need help quitting, ask your health care provider.  Wear loose-fitting clothing. Do not wear anything tight around your waist that causes pressure on your abdomen.  Raise (elevate) the head of your bed about 6 inches (15 cm).  Try to reduce your stress, such as with yoga or meditation. If you need help reducing stress, ask your health care provider.  If you are overweight, reduce your weight to an amount that is healthy for you. Ask your health care provider for guidance about a safe weight loss goal.  Keep all follow-up visits as told by your health care provider. This is important. Contact a health care provider if:  You have new symptoms.  You have unexplained weight loss.  You have difficulty swallowing, or it hurts to swallow.  You have wheezing or a persistent cough.  Your symptoms do not improve with treatment.  You have frequent heartburn for more than two weeks. Get help right away if:  You have pain in your arms, neck, jaw, teeth, or back.  You feel sweaty, dizzy, or light-headed.  You have chest pain or shortness of breath.  You vomit and your vomit looks like blood or coffee grounds.  Your stool is bloody or black. This information is not intended to replace advice given to you by your health care provider. Make sure you discuss any questions you have with your health  care provider. Document Released: 02/12/2009 Document Revised: 03/03/2016 Document Reviewed: 01/21/2015 Elsevier Interactive Patient Education  2017 [employer].

## 2017-02-06 NOTE — Progress Notes (Addendum)
Subjective:    Patient ID: Sandra Curtis, female    DOB: Jul 22, 1956, 61 y.o.   MRN: [id number]  Patient presents today for establish care (new patient)  HPI  no pcp in last 2years.  GERD: Chronic Unable to afford nexium. Current use of tagamet, but no sustained relief. Endoscopy done 2004 (esophageal dilation done).  Constipation: Last colonoscopy 2015 (normal).  Lower back pain: Chronic, ongoing for 5years. Some relief with excedrin Worse with prolonged standing (nature of job). No previous back injury.  Headache: Throbbing. Intermittent, ongoing for 2months.  Immunizations: (TDAP, Hep C screen, Pneumovax, Influenza, zoster)  Health Maintenance  Topic Date Due  . Tetanus Vaccine  03/24/1975  . Mammogram  07/11/2014  . Flu Shot  05/10/2017  . Pap Smear  05/21/2017  . Colon Cancer Screening  01/04/2024  .  Hepatitis C: One time screening is recommended by Center for Disease Control  (CDC) for  adults born from [birth year] through 1965.   Completed  . HIV Screening  Completed   Weight:  Wt Readings from Last 3 Encounters:  02/08/17 140 lb (63.5 kg)  02/06/17 141 lb (64 kg)  01/03/14 137 lb (62.1 kg)   Exercise:none Fall Risk: Fall Risk  02/06/2017  Falls in the past year? No   Home Safety:home alone, family is local, has 1 adult [place] (daughter in [place], [place]) Depression/Suicide: Depression screen [facility] 2/9 02/06/2017  Decreased Interest 0  Down, Depressed, Hopeless 0  PHQ - 2 Score 0   Pap Smear (every 75yrs for >21-29 without HPV, every 76yrs for >30-21yrs with HPV): current GYN is Dr. Chestine Spore with [facility]) Mammogram (yearly, >61yrs): last done 2017 (normal per patient).  Sexual History (birth control, marital status, STD):single, not sexually active.  Medications and allergies reviewed with patient and updated if appropriate.  Patient Active Problem List   Diagnosis Date Noted  . Insomnia 02/06/2017  . Gastroesophageal reflux disease with  esophagitis 02/06/2017  . DDD (degenerative disc disease), lumbosacral 02/06/2017  . Constipation 02/06/2017  . Dysphagia 02/06/2017  . Scoliosis of lumbar spine 02/06/2017  . Chronic midline low back pain without sciatica 02/06/2017  . Systolic murmur 10/02/2013  . Routine general medical examination at a health care facility 07/11/2012  . Headache 03/01/2012  . Seasonal allergic rhinitis 03/01/2012    Current Outpatient Prescriptions on File Prior to Visit  Medication Sig Dispense Refill  . Probiotic Product (PHILLIPS COLON HEALTH) CAPS Take 1 capsule by mouth daily.     No current facility-administered medications on file prior to visit.     Past Medical History:  Diagnosis Date  . Allergic rhinitis   . Allergy   . Depression   . Esophageal stricture   . GERD (gastroesophageal reflux disease)   . Inflammatory polyps of colon (HCC) 2010  . Systolic murmur   . UTI (urinary tract infection)     History reviewed. No pertinent surgical history.  Social History   Social History  . Marital status: Single    Spouse name: N/A  . Number of children: 1  . Years of education: N/A   Occupational History  . Paperworks Paperworks   Social History Main Topics  . Smoking status: Former Smoker    Packs/day: 0.50    Years: 35.00    Quit date: 06/10/2013  . Smokeless tobacco: Never Used  . Alcohol use Yes     Comment: occasionally  . Drug use: No  . Sexual activity: Not Asked   Other  Topics Concern  . None   Social History Narrative  . None    Family History  Problem Relation Age of Onset  . Arthritis Mother   . Thyroid disease Mother   . Heart disease Father   . Hypertension Father   . Diabetes Father   . Arthritis Sister   . Colon cancer Neg Hx   . Stomach cancer Neg Hx         Review of Systems  Constitutional: Negative for fever, malaise/fatigue and weight loss.  HENT: Negative for congestion and sore throat.   Eyes:       Negative for visual changes    Respiratory: Negative for cough and shortness of breath.   Cardiovascular: Negative for chest pain, palpitations and leg swelling.  Gastrointestinal: Positive for constipation and heartburn. Negative for blood in stool and diarrhea.  Genitourinary: Negative for dysuria, frequency and urgency.  Musculoskeletal: Positive for back pain. Negative for falls, joint pain and myalgias.  Skin: Negative for rash.  Neurological: Positive for headaches. Negative for dizziness and sensory change.  Endo/Heme/Allergies: Does not bruise/bleed easily.  Psychiatric/Behavioral: Negative for depression, substance abuse and suicidal ideas. The patient is not nervous/anxious.     Objective:   Vitals:   02/06/17 0904  BP: 122/70  Pulse: 67  Temp: 97.8 F (36.6 C)    Body mass index is 26.21 kg/m.   Physical Examination:  Physical Exam  Constitutional: She is oriented to person, place, and time and well-developed, well-nourished, and in no distress. No distress.  HENT:  Right Ear: External ear normal.  Left Ear: External ear normal.  Nose: Nose normal.  Mouth/Throat: Oropharynx is clear and moist. No oropharyngeal exudate.  Eyes: Conjunctivae and EOM are normal. Pupils are equal, round, and reactive to light. No scleral icterus.  Neck: Normal range of motion. Neck supple. No thyromegaly present.  Cardiovascular: Normal rate, normal heart sounds and intact distal pulses.   Pulmonary/Chest: Effort normal and breath sounds normal. She exhibits no tenderness.  Abdominal: Soft. Bowel sounds are normal. She exhibits no distension. There is no tenderness.  Musculoskeletal: Normal range of motion. She exhibits no edema, tenderness or deformity.  Negative straight leg raise  Lymphadenopathy:    She has no cervical adenopathy.  Neurological: She is alert and oriented to person, place, and time. Gait normal. Coordination normal.  Skin: Skin is warm and dry.  Psychiatric: Affect and judgment normal.     ASSESSMENT and PLAN:  Sandra Curtis was seen today for establish care.  Diagnoses and all orders for this visit:  Gastroesophageal reflux disease with esophagitis -     omeprazole (PRILOSEC) 20 MG capsule; Take 1 capsule (20 mg total) by mouth daily before breakfast. -     famotidine (PEPCID) 20 MG tablet; Take 1 tablet (20 mg total) by mouth at bedtime. -     Ambulatory referral to Gastroenterology -     CBC w/Diff; Future  Insomnia, unspecified type -     diphenhydrAMINE (BENADRYL ALLERGY) 25 MG tablet; Take 1-2tabs at bedtime prn for sleep -     Comprehensive metabolic panel; Future -     TSH; Future  DDD (degenerative disc disease), lumbosacral -     acetaminophen (TYLENOL) 500 MG tablet; Take 2 tablets (1,000 mg total) by mouth every 8 (eight) hours as needed for moderate pain or headache. -     DG Lumbar Spine 2-3 Views; Future -     meloxicam (MOBIC) 7.5 MG tablet; Take 1 tablet (  7.5 mg total) by mouth daily as needed for pain. -     tiZANidine (ZANAFLEX) 4 MG capsule; Take 1 capsule (4 mg total) by mouth 3 (three) times daily as needed for muscle spasms. -     Ambulatory referral to Physical Therapy  Encounter for lipid screening for cardiovascular disease -     Lipid panel; Future  Constipation, unspecified constipation type -     Ambulatory referral to Gastroenterology -     CBC w/Diff; Future -     Comprehensive metabolic panel; Future -     TSH; Future  Dysphagia, unspecified type -     Ambulatory referral to Gastroenterology -     Comprehensive metabolic panel; Future  Encounter for screening for human immunodeficiency virus (HIV) -     HIV antibody; Future  Encounter for hepatitis C screening test for low risk patient -     Hepatitis C Antibody; Future  Scoliosis of lumbar spine, unspecified scoliosis type -     meloxicam (MOBIC) 7.5 MG tablet; Take 1 tablet (7.5 mg total) by mouth daily as needed for pain. -     tiZANidine (ZANAFLEX) 4 MG capsule; Take 1  capsule (4 mg total) by mouth 3 (three) times daily as needed for muscle spasms. -     Ambulatory referral to Physical Therapy  Chronic midline low back pain without sciatica -     meloxicam (MOBIC) 7.5 MG tablet; Take 1 tablet (7.5 mg total) by mouth daily as needed for pain. -     tiZANidine (ZANAFLEX) 4 MG capsule; Take 1 capsule (4 mg total) by mouth 3 (three) times daily as needed for muscle spasms. -     Ambulatory referral to Physical Therapy   No problem-specific Assessment & Plan notes found for this encounter.     Follow up: Return in about 4 weeks (around 03/06/2017) for GERD and back pain.  Alysia Penna, NP

## 2017-02-06 NOTE — Progress Notes (Signed)
Pre visit review using our clinic review tool, if applicable. No additional management support is needed unless otherwise documented below in the visit note. 

## 2017-02-07 LAB — HIV ANTIBODY (ROUTINE TESTING W REFLEX): HIV 1&2 Ab, 4th Generation: NONREACTIVE

## 2017-02-07 LAB — HEPATITIS C ANTIBODY: HCV Ab: NEGATIVE

## 2017-02-07 NOTE — Progress Notes (Signed)
Normal results

## 2017-02-08 ENCOUNTER — Ambulatory Visit (INDEPENDENT_AMBULATORY_CARE_PROVIDER_SITE_OTHER): Payer: BLUE CROSS/BLUE SHIELD | Admitting: Gastroenterology

## 2017-02-08 ENCOUNTER — Encounter: Payer: Self-pay | Admitting: Gastroenterology

## 2017-02-08 VITALS — BP 120/66 | HR 76 | Ht 61.0 in | Wt 140.0 lb

## 2017-02-08 DIAGNOSIS — K219 Gastro-esophageal reflux disease without esophagitis: Secondary | ICD-10-CM

## 2017-02-08 DIAGNOSIS — K5900 Constipation, unspecified: Secondary | ICD-10-CM

## 2017-02-08 NOTE — Patient Instructions (Signed)
Continue current medications.  Patient advised to avoid spicy, acidic, citrus, chocolate, mints, fruit and fruit juices.  Limit the intake of caffeine, alcohol and Soda.  Don't exercise too soon after eating.  Don't lie down within 3-4 hours of eating.  Elevate the head of your bed.  Call if your symptoms not any better in 2 weeks.   Thank you for choosing me and [facility].  Sandra Curtis. Pleas Koch., MD., Clementeen Graham

## 2017-02-08 NOTE — Progress Notes (Signed)
History of Present Illness: This is a 71 referred by Nche, Sandra Gains, NP for the evaluation of GERD and constipation. Her reflux symptoms have been poorly controlled on Tagamet. She was evaluated by her PCP and placed on omeprazole every morning and famotidine at bedtime a couple days ago and has already noted an improvement in her symptoms. She has mild constipation that has resolved with increase in dietary fiber.   EGD 05/2003: Esophageal stricture-dilated, erosive antral gastritis.  Colonoscopy 12/2013: small internal hemorrhoids, otherwise normal.  Allergies  Allergen Reactions  . Iodine     Hives    Outpatient Medications Prior to Visit  Medication Sig Dispense Refill  . acetaminophen (TYLENOL) 500 MG tablet Take 2 tablets (1,000 mg total) by mouth every 8 (eight) hours as needed for moderate pain or headache. 30 tablet 0  . diphenhydrAMINE (BENADRYL ALLERGY) 25 MG tablet Take 1-2tabs at bedtime prn for sleep 30 tablet 0  . famotidine (PEPCID) 20 MG tablet Take 1 tablet (20 mg total) by mouth at bedtime. 30 tablet 2  . meloxicam (MOBIC) 7.5 MG tablet Take 1 tablet (7.5 mg total) by mouth daily as needed for pain. 30 tablet 1  . Multiple Vitamin (MULTIVITAMIN) tablet Take 1 tablet by mouth daily.    Marland Kitchen omeprazole (PRILOSEC) 20 MG capsule Take 1 capsule (20 mg total) by mouth daily before breakfast. 30 capsule 2  . polycarbophil (FIBERCON) 625 MG tablet Take 625 mg by mouth daily.    . Probiotic Product (PHILLIPS COLON HEALTH) CAPS Take 1 capsule by mouth daily.    Marland Kitchen tiZANidine (ZANAFLEX) 4 MG capsule Take 1 capsule (4 mg total) by mouth 3 (three) times daily as needed for muscle spasms. 30 capsule 0   No facility-administered medications prior to visit.    Past Medical History:  Diagnosis Date  . Allergic rhinitis   . Allergy   . Depression   . Esophageal stricture   . GERD (gastroesophageal reflux disease)   . Inflammatory polyps of colon (HCC) 2010  . Systolic murmur     . UTI (urinary tract infection)    History reviewed. No pertinent surgical history. Social History   Social History  . Marital status: Single    Spouse name: N/A  . Number of children: 1  . Years of education: N/A   Occupational History  . Paperworks Paperworks   Social History Main Topics  . Smoking status: Former Smoker    Packs/day: 0.50    Years: 35.00    Quit date: 06/10/2013  . Smokeless tobacco: Never Used  . Alcohol use Yes     Comment: occasionally  . Drug use: No  . Sexual activity: Not Asked   Other Topics Concern  . None   Social History Narrative  . None   Family History  Problem Relation Age of Onset  . Arthritis Mother   . Thyroid disease Mother   . Heart disease Father   . Hypertension Father   . Diabetes Father   . Arthritis Sister   . Colon cancer Neg Hx   . Stomach cancer Neg Hx       Review of Systems: Pertinent positive and negative review of systems were noted in the above HPI section. All other review of systems were otherwise negative.    Physical Exam: General: Well developed, well nourished, no acute distress Head: Normocephalic and atraumatic Eyes:  sclerae anicteric, EOMI Ears: Normal auditory acuity Mouth: No deformity or lesions Neck: Supple, no  masses or thyromegaly Lungs: Clear throughout to auscultation Heart: Regular rate and rhythm; no murmurs, rubs or bruits Abdomen: Soft, non tender and non distended. No masses, hepatosplenomegaly or hernias noted. Normal Bowel sounds Rectal: not done Musculoskeletal: Symmetrical with no gross deformities  Skin: No lesions on visible extremities Pulses:  Normal pulses noted Extremities: No clubbing, cyanosis, edema or deformities noted Neurological: Alert oriented x 4, grossly nonfocal Cervical Nodes:  No significant cervical adenopathy Inguinal Nodes: No significant inguinal adenopathy Psychological:  Alert and cooperative. Normal mood and affect  Assessment and  Recommendations:  1. GERD, inadequate control. History of esophageal stricture. Continue omeprazole 20 mg qam and Pepcid 20 mg hs. closely follow all standard antireflux measures. Patient is advised to call the office if her symptoms are not under very good control with 3-4 weeks. Return office visit 6 weeks. Consider PPI bid and EGD if symptoms not adequately controlled.  2. Constipation. Continue high dietary fiber and water intake.   cc: [name], NP [address] [place], [place] [address]

## 2017-02-16 ENCOUNTER — Telehealth: Payer: Self-pay | Admitting: Nurse Practitioner

## 2017-02-16 NOTE — Telephone Encounter (Signed)
[facility]ax[facility] @ [phone].pwr

## 2017-02-23 ENCOUNTER — Telehealth: Payer: Self-pay | Admitting: Nurse Practitioner

## 2017-02-23 NOTE — Telephone Encounter (Signed)
meloxicam (MOBIC) 7.5 MG tablet   Patient is requesting a refill on this medication. She also wanted to know if it could be upped. Please advise. Thank you.

## 2017-02-23 NOTE — Telephone Encounter (Signed)
It is not time for refill. She was given 30tabs 02/06/2017 with 1 refill. I will not recommend an increase in dose due to possible adverse effects on renal and GI function.

## 2017-02-23 NOTE — Telephone Encounter (Signed)
Called to inform pt. No answer, LVM, try again later.

## 2017-02-27 NOTE — Telephone Encounter (Signed)
Called and LVM to inform patient. VM was no set up.

## 2017-03-01 ENCOUNTER — Ambulatory Visit: Payer: BLUE CROSS/BLUE SHIELD | Attending: Nurse Practitioner | Admitting: Physical Therapy

## 2017-03-01 DIAGNOSIS — G8929 Other chronic pain: Secondary | ICD-10-CM | POA: Diagnosis present

## 2017-03-01 DIAGNOSIS — M545 Low back pain: Secondary | ICD-10-CM | POA: Diagnosis present

## 2017-03-01 DIAGNOSIS — R293 Abnormal posture: Secondary | ICD-10-CM | POA: Diagnosis present

## 2017-03-01 NOTE — Therapy (Addendum)
[place], [place], [address] Phone: [phone]   Fax:  [phone]  Physical Therapy Evaluation and Addendum, Discharge Patient Details  Name: Sandra Curtis MRN: [id number] Date of Birth: Apr 13, 1956 Referring Provider: Wilfred Lacy NP    Encounter Date: 03/01/2017      PT End of Session - 03/01/17 0932    Visit Number 1   Number of Visits 8   Date for PT Re-Evaluation 03/31/17   PT Start Time 0847   PT Stop Time 0933   PT Time Calculation (min) 46 min   Activity Tolerance Patient tolerated treatment well   Behavior During Therapy [facility] for tasks assessed/performed      Past Medical History:  Diagnosis Date  . Allergic rhinitis   . Allergy   . Depression   . Esophageal stricture   . GERD (gastroesophageal reflux disease)   . Inflammatory polyps of colon ([place]) 2010  . Systolic murmur   . UTI (urinary tract infection)     No past surgical history on file.  There were no vitals filed for this visit.       Subjective Assessment - 03/01/17 0851    Subjective Patient reports back pain which has been chronic, worsening over the past year.  She has pain in low back, denies radiating pain, weakness and red flags.  She has difficulty standing for periods at work, lifting, bending.  Works [employer] job and has been for 16 hours.     Limitations Lifting;Standing;House hold activities   How long can you sit comfortably? not limited    How long can you stand comfortably? 1 hour    How long can you walk comfortably? 2 hours    Diagnostic tests XR showed DDD and levoscoliosis L2-L3   Patient Stated Goals Patient would like to feel better    Currently in Pain? Yes   Pain Score 8    Pain Location Back   Pain Orientation Lower   Pain Descriptors / Indicators Heaviness;Aching;Sore   Pain Type Chronic pain   Pain Radiating Towards none    Pain Onset More than a month ago   Pain Frequency Constant    Aggravating Factors  standing long periods    Pain Relieving Factors sitting, meds and Tylenol    Effect of Pain on Daily Activities Pain affects work, has to take meds all through the work day.    Multiple Pain Sites No            [facility] PT Assessment - 03/01/17 0900      Assessment   Medical Diagnosis lumbar DDD   Referring Provider Wilfred Lacy NP     Onset Date/Surgical Date --  chronic 2006   Prior Therapy No      Precautions   Precautions None     Restrictions   Weight Bearing Restrictions No     Balance Screen   Has the patient fallen in the past 6 months No     [place] residence   Living Arrangements Alone   Type of [place] to enter     Prior Function   Level of Independence Independent   Vocation Full time employment   Vocation Requirements standing, repetitive activities    Leisure no energy left for fun      Cognition   Overall Cognitive Status Within Functional Limits for tasks assessed     Sensation  Light Touch Appears Intact     Coordination   Gross Motor Movements are Fluid and Coordinated Not tested     Squat   Comments min pain      Single Leg Stance   Comments poor effort     Posture/Postural Control   Posture/Postural Control Postural limitations   Postural Limitations Rounded Shoulders;Forward head;Increased lumbar lordosis;Anterior pelvic tilt   Posture Comments knees hyper extend      AROM   Overall AROM Comments good hip ROM and no pain with A/PROM of hips    Lumbar Flexion [facility]   Lumbar Extension 50% pain    Lumbar - Right Side Bend pain    Lumbar - Left Side Bend WFL    Lumbar - Right Rotation WFL   Lumbar - Left Rotation [facility]      Strength   Right Hip Flexion 4+/5   Right Hip Extension 4/5   Right Hip ABduction 4+/5   Left Hip Flexion 4+/5   Left Hip Extension 4/5   Left Hip ABduction 4/5   Right Knee Flexion 3+/5   Right Knee Extension 4+/5   Left  Knee Flexion 4/5   Left Knee Extension 4+/5   Right Ankle Dorsiflexion 5/5   Left Ankle Dorsiflexion 5/5     Flexibility   Hamstrings 90 deg      Palpation   Spinal mobility good    Palpation comment sore lateral to lumbar spine, bilateral and along lateral glutes                    [facility] Adult PT Treatment/Exercise - 03/01/17 0915      Lumbar Exercises: Stretches   Single Knee to Chest Stretch 2 reps;30 seconds   Lower Trunk Rotation 10 seconds   Lower Trunk Rotation Limitations x 10    Pelvic Tilt 5 reps     Lumbar Exercises: Quadruped   Other Quadruped Lumbar Exercises childs pose forward and lateral 30 sec x 2 each      Moist Heat Therapy   Number Minutes Moist Heat 15 Minutes   Moist Heat Location Lumbar Spine                PT Education - 03/01/17 1535    Education provided Yes   Education Details PT/POC, HEP, DDD and posture, lifting    Person(s) Educated Patient   Methods Explanation;Handout   Comprehension Verbalized understanding;Returned demonstration;Need further instruction             PT Long Term Goals - 03/01/17 1539      PT LONG TERM GOAL #1   Title Pt will be I with HEP for core, LE strength    Time 4   Period Weeks   Status New     PT LONG TERM GOAL #2   Title Pt will be able to work normal schedule with pain managed with less pain meds (<5/10)    Time 4   Period Weeks   Status New     PT LONG TERM GOAL #3   Title Pt will be able to lift items from the floor (25 lbs) to waist height without increased pain    Time 4   Period Weeks   Status New     PT LONG TERM GOAL #4   Title Pt will demo proper lifting and use of core, LEs for transferring boxes on and off line at work (simulated work activities)    Time 4  Period Weeks   Status New               Plan - 03/01/17 1535    Clinical Impression Statement Pt presents with low complexity eval of low back pain without sciatica ongoing for several years.   Her pain is worse than it has been in the past.  She has mild strength deficits in hips, knees and core.  She will benefit greatly for core conditioning and training to use proper muscles when lifting and carrying boxes at work.     Rehab Potential Excellent   PT Frequency 2x / week   PT Duration 4 weeks   PT Treatment/Interventions ADLs/Self Care Home Management;Patient/family education;Cryotherapy;Electrical Stimulation;Moist Heat;Ultrasound;Passive range of motion;Neuromuscular re-education;Balance training;Therapeutic exercise;Therapeutic activities;Manual techniques;Functional mobility training   PT Next Visit Plan review HEP and go over lifting, posture mechanics in standing for work activities, repeat MHP    PT Home Exercise Plan PPT, LTR, knee to chest, childs pose    Consulted and Agree with Plan of Care Patient      Patient will benefit from skilled therapeutic intervention in order to improve the following deficits and impairments:  Decreased activity tolerance, Decreased balance, Impaired flexibility, Postural dysfunction, Decreased mobility, Decreased strength, Pain  Visit Diagnosis: Chronic bilateral low back pain without sciatica  Abnormal posture     Problem List Patient Active Problem List   Diagnosis Date Noted  . Insomnia 02/06/2017  . Gastroesophageal reflux disease with esophagitis 02/06/2017  . DDD (degenerative disc disease), lumbosacral 02/06/2017  . Constipation 02/06/2017  . Dysphagia 02/06/2017  . Scoliosis of lumbar spine 02/06/2017  . Chronic midline low back pain without sciatica 02/06/2017  . Systolic murmur 39/09/2582  . Routine general medical examination at a health care facility 07/11/2012  . Headache 03/01/2012  . Seasonal allergic rhinitis 03/01/2012    Billy Rocco 03/01/2017, 3:43 PM  [facility] [address] [place], [place], [address] Phone: [phone]   Fax:  [phone]  Name:  Sandra Curtis MRN: [id number] Date of Birth: 1956/01/24   Raeford Razor, PT 03/01/17 3:43 PM Phone: [phone] Fax: [phone]   PHYSICAL THERAPY DISCHARGE SUMMARY  Visits from Start of Care: 1  Current functional level related to goals / functional outcomes: Unknown, did not schedule appts    Remaining deficits: Unknown at this time   Education / Equipment: HEP, posture Plan: Patient agrees to discharge.  Patient goals were not met. Patient is being discharged due to not returning since the last visit.  ?????    Raeford Razor, PT 04/04/17 1:51 PM Phone: [phone] Fax: [phone]

## 2017-03-01 NOTE — Patient Instructions (Signed)
PELVIC TILT: Posterior    Tighten abdominals, flatten low back. _10__ reps per set, __2_ sets per day, __5-7_ days per week   Copyright  VHI. All rights reserved.    Knee to Chest    Lying supine, bend involved knee to chest __3_ times. Repeat with other leg. Hold 30 sec  Do __2_ times per day.  Copyright  VHI. All rights reserved.   BACK: Child's Pose (Sciatica)    Sit in knee-chest position and reach arms forward. Separate knees for comfort. Hold position for __3_ breaths. Repeat __3_ times. Do _2__ times per day.  Copyright  VHI. All rights reserved.    Lower Trunk Rotation Stretch    Keeping back flat and feet together, rotate knees to left side. Hold __10__ seconds. Repeat __10__ times per set. Do ___2_ sets per session. Do __2_ sessions per day.  http://orth.exer.us/123   Copyright  VHI. All rights reserved.

## 2017-03-07 ENCOUNTER — Ambulatory Visit (INDEPENDENT_AMBULATORY_CARE_PROVIDER_SITE_OTHER): Payer: BLUE CROSS/BLUE SHIELD | Admitting: Nurse Practitioner

## 2017-03-07 ENCOUNTER — Encounter: Payer: Self-pay | Admitting: Nurse Practitioner

## 2017-03-07 VITALS — BP 122/64 | HR 65 | Temp 99.0°F | Ht 61.0 in | Wt 139.0 lb

## 2017-03-07 DIAGNOSIS — K2100 Gastro-esophageal reflux disease with esophagitis, without bleeding: Secondary | ICD-10-CM

## 2017-03-07 DIAGNOSIS — M545 Low back pain: Secondary | ICD-10-CM

## 2017-03-07 DIAGNOSIS — R0789 Other chest pain: Secondary | ICD-10-CM

## 2017-03-07 DIAGNOSIS — M5137 Other intervertebral disc degeneration, lumbosacral region: Secondary | ICD-10-CM

## 2017-03-07 DIAGNOSIS — G8929 Other chronic pain: Secondary | ICD-10-CM

## 2017-03-07 MED ORDER — OMEPRAZOLE 20 MG PO CPDR: 20.0000 mg | DELAYED_RELEASE_CAPSULE | Freq: Two times a day (BID) | ORAL | 1 refills | Status: DC

## 2017-03-07 MED ORDER — GABAPENTIN 100 MG PO CAPS: 100.0000 mg | ORAL_CAPSULE | Freq: Three times a day (TID) | ORAL | 1 refills | Status: DC

## 2017-03-07 MED ORDER — MELOXICAM 7.5 MG PO TABS: 7.5000 mg | ORAL_TABLET | Freq: Every day | ORAL | 1 refills | Status: DC | PRN

## 2017-03-07 MED ORDER — TIZANIDINE HCL 4 MG PO CAPS: 4.0000 mg | ORAL_CAPSULE | Freq: Two times a day (BID) | ORAL | 0 refills | Status: DC | PRN

## 2017-03-07 NOTE — Progress Notes (Signed)
Subjective:  Patient ID: Sandra Curtis, female    DOB: 07-11-1956  Age: 61 y.o. MRN: [id number]  CC: Follow-up (4 wk/back pain still--req mobic consult. )   Chest Pain   This is a new problem. The current episode started in the past 7 days. The onset quality is gradual. The problem occurs intermittently. The problem has been waxing and waning. The pain is present in the lateral region. The pain is mild. The quality of the pain is described as sharp. The pain does not radiate. Associated symptoms include dizziness. Pertinent negatives include no abdominal pain, back pain, claudication, cough, diaphoresis, exertional chest pressure, fever, headaches, hemoptysis, irregular heartbeat, leg pain, lower extremity edema, malaise/fatigue, nausea, near-syncope, numbness, orthopnea, palpitations, PND, shortness of breath, sputum production, syncope, vomiting or weakness. The pain is aggravated by nothing. She has tried nothing for the symptoms. Risk factors include lack of exercise, obesity, post-menopausal and sedentary lifestyle.  Pertinent negatives for past medical history include no diabetes, no hyperlipidemia, no hypertension, no sleep apnea and no thyroid problem.  Pertinent negatives for family medical history include: no early MI.   Back pain: Persistent with nature of job (prolonged standing and walking). Denies any new symptoms.  Outpatient Medications Prior to Visit  Medication Sig Dispense Refill  . acetaminophen (TYLENOL) 500 MG tablet Take 2 tablets (1,000 mg total) by mouth every 8 (eight) hours as needed for moderate pain or headache. 30 tablet 0  . diphenhydrAMINE (BENADRYL ALLERGY) 25 MG tablet Take 1-2tabs at bedtime prn for sleep 30 tablet 0  . famotidine (PEPCID) 20 MG tablet Take 1 tablet (20 mg total) by mouth at bedtime. 30 tablet 2  . Multiple Vitamin (MULTIVITAMIN) tablet Take 1 tablet by mouth daily.    . polycarbophil (FIBERCON) 625 MG tablet Take 625 mg by mouth  daily.    . Probiotic Product (PHILLIPS COLON HEALTH) CAPS Take 1 capsule by mouth daily.    . meloxicam (MOBIC) 7.5 MG tablet Take 1 tablet (7.5 mg total) by mouth daily as needed for pain. 30 tablet 1  . omeprazole (PRILOSEC) 20 MG capsule Take 1 capsule (20 mg total) by mouth daily before breakfast. 30 capsule 2  . tiZANidine (ZANAFLEX) 4 MG capsule Take 1 capsule (4 mg total) by mouth 3 (three) times daily as needed for muscle spasms. 30 capsule 0   No facility-administered medications prior to visit.     ROS See HPI  Objective:  BP 122/64   Pulse 65   Temp 99 F (37.2 C)   Ht 5\' 1"  (1.549 m)   Wt 139 lb (63 kg)   SpO2 98%   BMI 26.26 kg/m   BP Readings from Last 3 Encounters:  03/07/17 122/64  02/08/17 120/66  02/06/17 122/70    Wt Readings from Last 3 Encounters:  03/07/17 139 lb (63 kg)  02/08/17 140 lb (63.5 kg)  02/06/17 141 lb (64 kg)    Physical Exam  Constitutional: She is oriented to person, place, and time. No distress.  HENT:  Right Ear: External ear normal.  Left Ear: External ear normal.  Nose: Nose normal.  Mouth/Throat: No oropharyngeal exudate.  Eyes: No scleral icterus.  Neck: Normal range of motion. Neck supple.  Cardiovascular: Normal rate, regular rhythm and normal heart sounds.   Pulmonary/Chest: Effort normal and breath sounds normal. No respiratory distress.  Abdominal: Soft. She exhibits no distension.  Musculoskeletal: Normal range of motion. She exhibits no edema.  Neurological: She is alert and  oriented to person, place, and time.  Skin: Skin is warm and dry.  Psychiatric: She has a normal mood and affect. Her behavior is normal.    Lab Results  Component Value Date   WBC 8.0 02/06/2017   HGB 11.4 (L) 02/06/2017   HCT 34.4 (L) 02/06/2017   PLT 259.0 02/06/2017   GLUCOSE 102 (H) 02/06/2017   CHOL 173 02/06/2017   TRIG 60.0 02/06/2017   HDL 58.80 02/06/2017   LDLCALC 102 (H) 02/06/2017   ALT 21 02/06/2017   AST 28  02/06/2017   NA 143 02/06/2017   K 4.4 02/06/2017   CL 107 02/06/2017   CREATININE 1.06 02/06/2017   BUN 14 02/06/2017   CO2 29 02/06/2017   TSH 0.88 02/06/2017    Dg Lumbar Spine 2-3 Views  Result Date: 02/06/2017 CLINICAL DATA:  61 year old female with chronic lumbar spine pain. No known injury. EXAM: LUMBAR SPINE - 2-3 VIEW COMPARISON:  None. FINDINGS: No evidence of acute fracture or malalignment. There are 5 non rib-bearing lumbar vertebra. Mild levoconvex scoliosis centered at L2-L3. Focal degenerative disc disease at L5-S1. Degenerative changes are also present at T11-T12. No lytic or blastic osseous lesion. The visualized bowel gas pattern is unremarkable. IMPRESSION: 1. Mild levoconvex scoliosis centered at L2-L3. 2. L5-S1 degenerative disc disease with loss of disc space height, endplate sclerosis and osteophyte formation. 3. Mild degenerative changes in the lower thoracic spine. Electronically Signed   By: Malachy MoanHeath  McCullough M.D.   On: 02/06/2017 10:26   ECG: normal sinus rhythm, no abnormal ST segment or Twave abnormality.  Assessment & Plan:   Cammie McgeeHenrietta was seen today for follow-up.  Diagnoses and all orders for this visit:  Chronic midline low back pain without sciatica -     meloxicam (MOBIC) 7.5 MG tablet; Take 1 tablet (7.5 mg total) by mouth daily as needed for pain. With food -     tiZANidine (ZANAFLEX) 4 MG capsule; Take 1 capsule (4 mg total) by mouth 2 (two) times daily as needed for muscle spasms. -     gabapentin (NEURONTIN) 100 MG capsule; Take 1 capsule (100 mg total) by mouth 3 (three) times daily.  DDD (degenerative disc disease), lumbosacral -     meloxicam (MOBIC) 7.5 MG tablet; Take 1 tablet (7.5 mg total) by mouth daily as needed for pain. With food -     tiZANidine (ZANAFLEX) 4 MG capsule; Take 1 capsule (4 mg total) by mouth 2 (two) times daily as needed for muscle spasms.  Gastroesophageal reflux disease with esophagitis -     omeprazole (PRILOSEC) 20  MG capsule; Take 1 capsule (20 mg total) by mouth 2 (two) times daily before a meal.  Atypical chest pain -     EKG 12-Lead -     Ambulatory referral to Cardiology   I have changed Ms. Ruttan's meloxicam, omeprazole, and tiZANidine. I am also having her start on gabapentin. Additionally, I am having her maintain her PHILLIPS COLON HEALTH, polycarbophil, multivitamin, diphenhydrAMINE, acetaminophen, famotidine, and Biotin.  Meds ordered this encounter  Medications  . Biotin 5000 MCG CAPS    Sig: Take by mouth.  . meloxicam (MOBIC) 7.5 MG tablet    Sig: Take 1 tablet (7.5 mg total) by mouth daily as needed for pain. With food    Dispense:  90 tablet    Refill:  1    Order Specific Question:   Supervising Provider    Answer:   Tresa GarterPLOTNIKOV, ALEKSEI V [[id number]]  .  omeprazole (PRILOSEC) 20 MG capsule    Sig: Take 1 capsule (20 mg total) by mouth 2 (two) times daily before a meal.    Dispense:  180 capsule    Refill:  1    Order Specific Question:   Supervising Provider    Answer:   Tresa Garter [[id number]]  . tiZANidine (ZANAFLEX) 4 MG capsule    Sig: Take 1 capsule (4 mg total) by mouth 2 (two) times daily as needed for muscle spasms.    Dispense:  30 capsule    Refill:  0    Order Specific Question:   Supervising Provider    Answer:   Tresa Garter [[id number]]  . gabapentin (NEURONTIN) 100 MG capsule    Sig: Take 1 capsule (100 mg total) by mouth 3 (three) times daily.    Dispense:  90 capsule    Refill:  1    Order Specific Question:   Supervising Provider    Answer:   Tresa Garter [[id number]]    Follow-up: Return in about 3 months (around 06/07/2017), or if symptoms worsen or fail to improve.  Alysia Penna, NP

## 2017-03-07 NOTE — Patient Instructions (Signed)
Nonspecific Chest Pain °Chest pain can be caused by many different conditions. There is always a chance that your pain could be related to something serious, such as a heart attack or a blood clot in your lungs. Chest pain can also be caused by conditions that are not life-threatening. If you have chest pain, it is very important to follow up with your health care provider. °What are the causes? °Causes of this condition include: °· Heartburn. °· Pneumonia or bronchitis. °· Anxiety or stress. °· Inflammation around your heart (pericarditis) or lung (pleuritis or pleurisy). °· A blood clot in your lung. °· A collapsed lung (pneumothorax). This can develop suddenly on its own (spontaneous pneumothorax) or from trauma to the chest. °· Shingles infection (varicella-zoster virus). °· Heart attack. °· Damage to the bones, muscles, and cartilage that make up your chest wall. This can include: °? Bruised bones due to injury. °? Strained muscles or cartilage due to frequent or repeated coughing or overwork. °? Fracture to one or more ribs. °? Sore cartilage due to inflammation (costochondritis). ° °What increases the risk? °Risk factors for this condition may include: °· Activities that increase your risk for trauma or injury to your chest. °· Respiratory infections or conditions that cause frequent coughing. °· Medical conditions or overeating that can cause heartburn. °· Heart disease or family history of heart disease. °· Conditions or health behaviors that increase your risk of developing a blood clot. °· Having had chicken pox (varicella zoster). ° °What are the signs or symptoms? °Chest pain can feel like: °· Burning or tingling on the surface of your chest or deep in your chest. °· Crushing, pressure, aching, or squeezing pain. °· Dull or sharp pain that is worse when you move, cough, or take a deep breath. °· Pain that is also felt in your back, neck, shoulder, or arm, or pain that spreads to any of these  areas. ° °Your chest pain may come and go, or it may stay constant. °How is this diagnosed? °Lab tests or other studies may be needed to find the cause of your pain. Your health care provider may have you take a test called an ECG (electrocardiogram). An ECG records your heartbeat patterns at the time the test is performed. You may also have other tests, such as: °· Transthoracic echocardiogram (TTE). In this test, sound waves are used to create a picture of the heart structures and to look at how blood flows through your heart. °· Transesophageal echocardiogram (TEE). This is a more advanced imaging test that takes images from inside your body. It allows your health care provider to see your heart in finer detail. °· Cardiac monitoring. This allows your health care provider to monitor your heart rate and rhythm in real time. °· Holter monitor. This is a portable device that records your heartbeat and can help to diagnose abnormal heartbeats. It allows your health care provider to track your heart activity for several days, if needed. °· Stress tests. These can be done through exercise or by taking medicine that makes your heart beat more quickly. °· Blood tests. °· Other imaging tests. ° °How is this treated? °Treatment depends on what is causing your chest pain. Treatment may include: °· Medicines. These may include: °? Acid blockers for heartburn. °? Anti-inflammatory medicine. °? Pain medicine for inflammatory conditions. °? Antibiotic medicine, if an infection is present. °? Medicines to dissolve blood clots. °? Medicines to treat coronary artery disease (CAD). °· Supportive care for conditions that   do not require medicines. This may include: °? Resting. °? Applying heat or cold packs to injured areas. °? Limiting activities until pain decreases. ° °Follow these instructions at home: °Medicines °· If you were prescribed an antibiotic, take it as told by your health care provider. Do not stop taking the  antibiotic even if you start to feel better. °· Take over-the-counter and prescription medicines only as told by your health care provider. °Lifestyle °· Do not use any products that contain nicotine or tobacco, such as cigarettes and e-cigarettes. If you need help quitting, ask your health care provider. °· Do not drink alcohol. °· Make lifestyle changes as directed by your health care provider. These may include: °? Getting regular exercise. Ask your health care provider to suggest some activities that are safe for you. °? Eating a heart-healthy diet. A registered dietitian can help you to learn healthy eating options. °? Maintaining a healthy weight. °? Managing diabetes, if necessary. °? Reducing stress, such as with yoga or relaxation techniques. °General instructions °· Avoid any activities that bring on chest pain. °· If heartburn is the cause for your chest pain, raise (elevate) the head of your bed about 6 inches (15 cm) by putting blocks under the legs. Sleeping with more pillows does not effectively relieve heartburn because it only changes the position of your head. °· Keep all follow-up visits as told by your health care provider. This is important. This includes any further testing if your chest pain does not go away. °Contact a health care provider if: °· Your chest pain does not go away. °· You have a rash with blisters on your chest. °· You have a fever. °· You have chills. °Get help right away if: °· Your chest pain is worse. °· You have a cough that gets worse, or you cough up blood. °· You have severe pain in your abdomen. °· You have severe weakness. °· You faint. °· You have sudden, unexplained chest discomfort. °· You have sudden, unexplained discomfort in your arms, back, neck, or jaw. °· You have shortness of breath at any time. °· You suddenly start to sweat, or your skin gets clammy. °· You feel nauseous or you vomit. °· You suddenly feel light-headed or dizzy. °· Your heart begins to beat  quickly, or it feels like it is skipping beats. °These symptoms may represent a serious problem that is an emergency. Do not wait to see if the symptoms will go away. Get medical help right away. Call your local emergency services (911 in the [place] Do not drive yourself to the hospital. °This information is not intended to replace advice given to you by your health care provider. Make sure you discuss any questions you have with your health care provider. °Document Released: 07/06/2005 Document Revised: 06/20/2016 Document Reviewed: 06/20/2016 °Elsevier Interactive Patient Education © 2017 Elsevier Inc. ° °

## 2017-03-16 ENCOUNTER — Telehealth: Payer: Self-pay | Admitting: Nurse Practitioner

## 2017-03-16 NOTE — Telephone Encounter (Signed)
[facility]

## 2017-03-30 ENCOUNTER — Encounter: Payer: Self-pay | Admitting: Nurse Practitioner

## 2017-04-03 ENCOUNTER — Ambulatory Visit: Payer: BLUE CROSS/BLUE SHIELD | Admitting: Gastroenterology

## 2017-04-06 ENCOUNTER — Other Ambulatory Visit: Payer: Self-pay | Admitting: *Deleted

## 2017-04-06 DIAGNOSIS — K2100 Gastro-esophageal reflux disease with esophagitis, without bleeding: Secondary | ICD-10-CM

## 2017-04-06 MED ORDER — OMEPRAZOLE 20 MG PO CPDR: 20.0000 mg | DELAYED_RELEASE_CAPSULE | Freq: Two times a day (BID) | ORAL | 2 refills | Status: DC

## 2017-04-06 MED ORDER — FAMOTIDINE 20 MG PO TABS: 20.0000 mg | ORAL_TABLET | Freq: Every day | ORAL | 2 refills | Status: DC

## 2017-04-06 NOTE — Telephone Encounter (Signed)
Pt left msg on triage stating needing refills on her omeprazole & pepcid. Tried calling pt back phone will not ring, can't get through. Sent refill to [facility]...Raechel Chute/lmb

## 2017-04-14 ENCOUNTER — Telehealth: Payer: Self-pay | Admitting: Nurse Practitioner

## 2017-04-14 NOTE — Telephone Encounter (Signed)
[facility]  Forwarded 11 pages to Anne NgNche Charlotte Lum NP

## 2017-05-25 ENCOUNTER — Other Ambulatory Visit: Payer: Self-pay | Admitting: Nurse Practitioner

## 2017-05-25 DIAGNOSIS — M545 Low back pain: Principal | ICD-10-CM

## 2017-05-25 DIAGNOSIS — G8929 Other chronic pain: Secondary | ICD-10-CM

## 2017-06-08 ENCOUNTER — Encounter: Payer: Self-pay | Admitting: Nurse Practitioner

## 2017-06-08 ENCOUNTER — Telehealth: Payer: Self-pay | Admitting: Nurse Practitioner

## 2017-06-08 ENCOUNTER — Ambulatory Visit (INDEPENDENT_AMBULATORY_CARE_PROVIDER_SITE_OTHER): Payer: BLUE CROSS/BLUE SHIELD | Admitting: Nurse Practitioner

## 2017-06-08 DIAGNOSIS — K2100 Gastro-esophageal reflux disease with esophagitis, without bleeding: Secondary | ICD-10-CM

## 2017-06-08 DIAGNOSIS — M545 Low back pain, unspecified: Secondary | ICD-10-CM

## 2017-06-08 DIAGNOSIS — G8929 Other chronic pain: Secondary | ICD-10-CM | POA: Diagnosis not present

## 2017-06-08 DIAGNOSIS — M5137 Other intervertebral disc degeneration, lumbosacral region: Secondary | ICD-10-CM | POA: Diagnosis not present

## 2017-06-08 MED ORDER — TIZANIDINE HCL 4 MG PO CAPS: 4.0000 mg | ORAL_CAPSULE | Freq: Two times a day (BID) | ORAL | 3 refills | Status: DC | PRN

## 2017-06-08 MED ORDER — OMEPRAZOLE 20 MG PO CPDR: 20.0000 mg | DELAYED_RELEASE_CAPSULE | Freq: Every day | ORAL | 3 refills | Status: DC

## 2017-06-08 MED ORDER — FAMOTIDINE 20 MG PO TABS: 20.0000 mg | ORAL_TABLET | Freq: Every day | ORAL | 3 refills | Status: DC

## 2017-06-08 NOTE — Patient Instructions (Signed)
Please have GYN send copy of PAP smear.

## 2017-06-08 NOTE — Progress Notes (Signed)
Subjective:  Patient ID: Sandra Curtis, female    DOB: March 28, 1956  Age: 61 y.o. MRN: [id number]  CC: Follow-up (3 month)   HPI  Back pain: Stable with Use of tylenol 500mg  2tabs and tizanidine 4mg  prn. No improvement with gabapentin and mobic.  GERD: Controlled with use of omeprazole and famotidine. Has made changes to diet to avoid triggers.  Atypical chest pain: Did not follow up with cardiology as recommended due to high copay. She states symptoms have resolved with use of pepcid and zantac.  Outpatient Medications Prior to Visit  Medication Sig Dispense Refill  . acetaminophen (TYLENOL) 500 MG tablet Take 2 tablets (1,000 mg total) by mouth every 8 (eight) hours as needed for moderate pain or headache. 30 tablet 0  . Biotin 5000 MCG CAPS Take by mouth.    . diphenhydrAMINE (BENADRYL ALLERGY) 25 MG tablet Take 1-2tabs at bedtime prn for sleep 30 tablet 0  . Multiple Vitamin (MULTIVITAMIN) tablet Take 1 tablet by mouth daily.    . polycarbophil (FIBERCON) 625 MG tablet Take 625 mg by mouth daily.    . Probiotic Product (PHILLIPS COLON HEALTH) CAPS Take 1 capsule by mouth daily.    . famotidine (PEPCID) 20 MG tablet Take 1 tablet (20 mg total) by mouth at bedtime. 30 tablet 2  . gabapentin (NEURONTIN) 100 MG capsule take 1 capsule by mouth three times a day 90 capsule 0  . meloxicam (MOBIC) 7.5 MG tablet Take 1 tablet (7.5 mg total) by mouth daily as needed for pain. With food 90 tablet 1  . omeprazole (PRILOSEC) 20 MG capsule Take 1 capsule (20 mg total) by mouth 2 (two) times daily before a meal. 60 capsule 2  . tiZANidine (ZANAFLEX) 4 MG capsule Take 1 capsule (4 mg total) by mouth 2 (two) times daily as needed for muscle spasms. 30 capsule 0   No facility-administered medications prior to visit.     ROS Review of Systems  Constitutional: Negative for malaise/fatigue and weight loss.  Respiratory: Negative for cough and shortness of breath.   Cardiovascular:  Negative for palpitations, orthopnea and leg swelling.  Gastrointestinal: Positive for heartburn. Negative for abdominal pain, blood in stool, constipation, diarrhea, melena, nausea and vomiting.  Musculoskeletal: Positive for back pain. Negative for falls.  Skin: Negative.   Neurological: Negative for dizziness, tingling, sensory change, loss of consciousness and headaches.  Psychiatric/Behavioral: Negative.      Objective:  BP 128/64 (BP Location: Left Arm, Patient Position: Sitting, Cuff Size: Normal)   Pulse 63   Temp 98.1 F (36.7 C) (Oral)   Ht 5\' 1"  (1.549 m)   Wt 145 lb (65.8 kg)   SpO2 100%   BMI 27.40 kg/m   BP Readings from Last 3 Encounters:  06/08/17 128/64  03/07/17 122/64  02/08/17 120/66    Wt Readings from Last 3 Encounters:  06/08/17 145 lb (65.8 kg)  03/07/17 139 lb (63 kg)  02/08/17 140 lb (63.5 kg)    Physical Exam  Constitutional: She is oriented to person, place, and time. No distress.  Neck: Normal range of motion. Neck supple.  Cardiovascular: Normal rate, regular rhythm and normal heart sounds.   Pulmonary/Chest: Effort normal and breath sounds normal.  Abdominal: Soft. Bowel sounds are normal. There is no tenderness.  Musculoskeletal: She exhibits no edema.  Lymphadenopathy:    She has no cervical adenopathy.  Neurological: She is alert and oriented to person, place, and time.  Skin: Skin is warm and  dry. No rash noted. No erythema.  Psychiatric: She has a normal mood and affect. Her behavior is normal. Thought content normal.  Vitals reviewed.   Lab Results  Component Value Date   WBC 8.0 02/06/2017   HGB 11.4 (L) 02/06/2017   HCT 34.4 (L) 02/06/2017   PLT 259.0 02/06/2017   GLUCOSE 102 (H) 02/06/2017   CHOL 173 02/06/2017   TRIG 60.0 02/06/2017   HDL 58.80 02/06/2017   LDLCALC 102 (H) 02/06/2017   ALT 21 02/06/2017   AST 28 02/06/2017   NA 143 02/06/2017   K 4.4 02/06/2017   CL 107 02/06/2017   CREATININE 1.06 02/06/2017    BUN 14 02/06/2017   CO2 29 02/06/2017   TSH 0.88 02/06/2017    Dg Lumbar Spine 2-3 Views  Result Date: 02/06/2017 CLINICAL DATA:  61 year old female with chronic lumbar spine pain. No known injury. EXAM: LUMBAR SPINE - 2-3 VIEW COMPARISON:  None. FINDINGS: No evidence of acute fracture or malalignment. There are 5 non rib-bearing lumbar vertebra. Mild levoconvex scoliosis centered at L2-L3. Focal degenerative disc disease at L5-S1. Degenerative changes are also present at T11-T12. No lytic or blastic osseous lesion. The visualized bowel gas pattern is unremarkable. IMPRESSION: 1. Mild levoconvex scoliosis centered at L2-L3. 2. L5-S1 degenerative disc disease with loss of disc space height, endplate sclerosis and osteophyte formation. 3. Mild degenerative changes in the lower thoracic spine. Electronically Signed   By: Malachy Moan M.D.   On: 02/06/2017 10:26    Assessment & Plan:   Karolyne was seen today for follow-up.  Diagnoses and all orders for this visit:  Gastroesophageal reflux disease with esophagitis -     omeprazole (PRILOSEC) 20 MG capsule; Take 1 capsule (20 mg total) by mouth daily. -     famotidine (PEPCID) 20 MG tablet; Take 1 tablet (20 mg total) by mouth at bedtime.  DDD (degenerative disc disease), lumbosacral -     tiZANidine (ZANAFLEX) 4 MG capsule; Take 1 capsule (4 mg total) by mouth 2 (two) times daily as needed for muscle spasms.  Chronic midline low back pain without sciatica -     tiZANidine (ZANAFLEX) 4 MG capsule; Take 1 capsule (4 mg total) by mouth 2 (two) times daily as needed for muscle spasms.   I have discontinued Ms. Zollars's meloxicam and gabapentin. I have also changed her omeprazole. Additionally, I am having her maintain her PHILL[employer] HEALTH, polycarbophil, multivitamin, diphenhydrAMINE, acetaminophen, Biotin, famotidine, and tiZANidine.  Meds ordered this encounter  Medications  . omeprazole (PRILOSEC) 20 MG capsule    Sig: Take 1  capsule (20 mg total) by mouth daily.    Dispense:  90 capsule    Refill:  3    Order Specific Question:   Supervising Provider    Answer:   Tresa Garter [[id number]]  . famotidine (PEPCID) 20 MG tablet    Sig: Take 1 tablet (20 mg total) by mouth at bedtime.    Dispense:  90 tablet    Refill:  3    Order Specific Question:   Supervising Provider    Answer:   Tresa Garter [[id number]]  . tiZANidine (ZANAFLEX) 4 MG capsule    Sig: Take 1 capsule (4 mg total) by mouth 2 (two) times daily as needed for muscle spasms.    Dispense:  60 capsule    Refill:  3    Order Specific Question:   Supervising Provider    Answer:   Tresa Garter [  [id number]]    Follow-up: Return in about 6 months (around 12/07/2017) for CPE (fasting) [place] location.  Alysia Pennaharlotte Nury Nebergall, NP

## 2017-06-08 NOTE — Telephone Encounter (Signed)
Would like to switch tizanidine capsules to tablets b/c of cost.  Requesting call back in regard.

## 2017-06-09 NOTE — Telephone Encounter (Signed)
Okay given to pharmacy 

## 2017-09-27 ENCOUNTER — Encounter: Payer: Self-pay | Admitting: Nurse Practitioner

## 2017-09-27 ENCOUNTER — Ambulatory Visit (INDEPENDENT_AMBULATORY_CARE_PROVIDER_SITE_OTHER): Payer: BLUE CROSS/BLUE SHIELD | Admitting: Nurse Practitioner

## 2017-09-27 VITALS — BP 140/70 | HR 78 | Temp 98.3°F | Ht 61.0 in | Wt 143.0 lb

## 2017-09-27 DIAGNOSIS — J324 Chronic pansinusitis: Secondary | ICD-10-CM | POA: Diagnosis not present

## 2017-09-27 DIAGNOSIS — G8929 Other chronic pain: Secondary | ICD-10-CM | POA: Diagnosis not present

## 2017-09-27 DIAGNOSIS — M5137 Other intervertebral disc degeneration, lumbosacral region: Secondary | ICD-10-CM

## 2017-09-27 DIAGNOSIS — M545 Low back pain, unspecified: Secondary | ICD-10-CM

## 2017-09-27 DIAGNOSIS — R51 Headache: Secondary | ICD-10-CM | POA: Diagnosis not present

## 2017-09-27 MED ORDER — ASPIRIN-ACETAMINOPHEN-CAFFEINE 250-250-65 MG PO TABS: 1.0000 | ORAL_TABLET | Freq: Three times a day (TID) | ORAL | 0 refills | Status: DC | PRN

## 2017-09-27 MED ORDER — FLUTICASONE PROPIONATE 50 MCG/ACT NA SUSP: 2.0000 | Freq: Every day | NASAL | 0 refills | Status: DC

## 2017-09-27 MED ORDER — METHYLPREDNISOLONE ACETATE 40 MG/ML IJ SUSP
40.0000 mg | Freq: Once | INTRAMUSCULAR | Status: AC
  Administered 2017-09-27: 40 mg via INTRAMUSCULAR

## 2017-09-27 MED ORDER — GUAIFENESIN ER 600 MG PO TB12: 600.0000 mg | ORAL_TABLET | Freq: Two times a day (BID) | ORAL | 0 refills | Status: DC | PRN

## 2017-09-27 MED ORDER — DICLOFENAC SODIUM 2 % TD SOLN: 1.0000 "application " | Freq: Two times a day (BID) | TRANSDERMAL | 1 refills | Status: DC

## 2017-09-27 NOTE — Patient Instructions (Addendum)
Please alternate between warm and cold compress as needed.  Continue back exercises as recommended.   Sinusitis, Adult Sinusitis is soreness and inflammation of your sinuses. Sinuses are hollow spaces in the bones around your face. They are located:  Around your eyes.  In the middle of your forehead.  Behind your nose.  In your cheekbones.  Your sinuses and nasal passages are lined with a stringy fluid (mucus). Mucus normally drains out of your sinuses. When your nasal tissues get inflamed or swollen, the mucus can get trapped or blocked so air cannot flow through your sinuses. This lets bacteria, viruses, and funguses grow, and that leads to infection. Follow these instructions at home: Medicines  Take, use, or apply over-the-counter and prescription medicines only as told by your doctor. These may include nasal sprays.  If you were prescribed an antibiotic medicine, take it as told by your doctor. Do not stop taking the antibiotic even if you start to feel better. Hydrate and Humidify  Drink enough water to keep your pee (urine) clear or pale yellow.  Use a cool mist humidifier to keep the humidity level in your home above 50%.  Breathe in steam for 10-15 minutes, 3-4 times a day or as told by your doctor. You can do this in the bathroom while a hot shower is running.  Try not to spend time in cool or dry air. Rest  Rest as much as possible.  Sleep with your head raised (elevated).  Make sure to get enough sleep each night. General instructions  Put a warm, moist washcloth on your face 3-4 times a day or as told by your doctor. This will help with discomfort.  Wash your hands often with soap and water. If there is no soap and water, use hand sanitizer.  Do not smoke. Avoid being around people who are smoking (secondhand smoke).  Keep all follow-up visits as told by your doctor. This is important. Contact a doctor if:  You have a fever.  Your symptoms get  worse.  Your symptoms do not get better within 10 days. Get help right away if:  You have a very bad headache.  You cannot stop throwing up (vomiting).  You have pain or swelling around your face or eyes.  You have trouble seeing.  You feel confused.  Your neck is stiff.  You have trouble breathing. This information is not intended to replace advice given to you by your health care provider. Make sure you discuss any questions you have with your health care provider. Document Released: 03/14/2008 Document Revised: 05/22/2016 Document Reviewed: 07/22/2015 Elsevier Interactive Patient Education  [employer]2018 [employer] Inc[employer]

## 2017-09-27 NOTE — Progress Notes (Signed)
Subjective:  Patient ID: Sandra Curtis, female    DOB: 1956-04-07  Age: 61 y.o. MRN: [id number]  CC: Headache (headach on side going toward forehead. everyday. note for work? didnt go to work today) and Back Pain (back pain---on and off)   Headache   This is a chronic problem. The current episode started more than 1 year ago. The problem occurs intermittently. The problem has been waxing and waning. The pain is located in the frontal, parietal and retro-orbital region. The pain does not radiate. The pain quality is similar to prior headaches. The quality of the pain is described as aching and dull. Associated symptoms include sinus pressure. Pertinent negatives include no blurred vision, scalp tenderness or visual change.   Back pain: Chronic  Waxing and waning. Worsening with prolong standing.  Outpatient Medications Prior to Visit  Medication Sig Dispense Refill  . acetaminophen (TYLENOL) 500 MG tablet Take 2 tablets (1,000 mg total) by mouth every 8 (eight) hours as needed for moderate pain or headache. 30 tablet 0  . Biotin 5000 MCG CAPS Take by mouth.    . diphenhydrAMINE (BENADRYL ALLERGY) 25 MG tablet Take 1-2tabs at bedtime prn for sleep 30 tablet 0  . famotidine (PEPCID) 20 MG tablet Take 1 tablet (20 mg total) by mouth at bedtime. 90 tablet 3  . Multiple Vitamin (MULTIVITAMIN) tablet Take 1 tablet by mouth daily.    Marland Kitchen omeprazole (PRILOSEC) 20 MG capsule Take 1 capsule (20 mg total) by mouth daily. 90 capsule 3  . polycarbophil (FIBERCON) 625 MG tablet Take 625 mg by mouth daily.    . Probiotic Product (PHILLIPS COLON HEALTH) CAPS Take 1 capsule by mouth daily.    Marland Kitchen tiZANidine (ZANAFLEX) 4 MG capsule Take 1 capsule (4 mg total) by mouth 2 (two) times daily as needed for muscle spasms. 60 capsule 3   No facility-administered medications prior to visit.     ROS See HPI  Objective:  BP 140/70   Pulse 78   Temp 98.3 F (36.8 C)   Ht 5\' 1"  (1.549 m)   Wt 143 lb  (64.9 kg)   SpO2 98%   BMI 27.02 kg/m   BP Readings from Last 3 Encounters:  09/27/17 140/70  06/08/17 128/64  03/07/17 122/64    Wt Readings from Last 3 Encounters:  09/27/17 143 lb (64.9 kg)  06/08/17 145 lb (65.8 kg)  03/07/17 139 lb (63 kg)    Physical Exam  Constitutional: She is oriented to person, place, and time. No distress.  HENT:  Right Ear: External ear and ear canal normal. Tympanic membrane is not injected. A middle ear effusion is present.  Left Ear: External ear and ear canal normal. No mastoid tenderness. Tympanic membrane is not injected. A middle ear effusion is present.  Nose: Mucosal edema and rhinorrhea present. Right sinus exhibits maxillary sinus tenderness and frontal sinus tenderness. Left sinus exhibits maxillary sinus tenderness and frontal sinus tenderness.  Mouth/Throat: Uvula is midline and oropharynx is clear and moist. No trismus in the jaw. No posterior oropharyngeal erythema.  Cardiovascular: Normal rate and regular rhythm.  Pulmonary/Chest: Effort normal and breath sounds normal.  Neurological: She is alert and oriented to person, place, and time.  Skin: Skin is warm and dry.  Vitals reviewed.   Lab Results  Component Value Date   WBC 8.0 02/06/2017   HGB 11.4 (L) 02/06/2017   HCT 34.4 (L) 02/06/2017   PLT 259.0 02/06/2017   GLUCOSE 102 (H) 02/06/2017  CHOL 173 02/06/2017   TRIG 60.0 02/06/2017   HDL 58.80 02/06/2017   LDLCALC 102 (H) 02/06/2017   ALT 21 02/06/2017   AST 28 02/06/2017   NA 143 02/06/2017   K 4.4 02/06/2017   CL 107 02/06/2017   CREATININE 1.06 02/06/2017   BUN 14 02/06/2017   CO2 29 02/06/2017   TSH 0.88 02/06/2017    Dg Lumbar Spine 2-3 Views  Result Date: 02/06/2017 CLINICAL DATA:  61 year old female with chronic lumbar spine pain. No known injury. EXAM: LUMBAR SPINE - 2-3 VIEW COMPARISON:  None. FINDINGS: No evidence of acute fracture or malalignment. There are 5 non rib-bearing lumbar vertebra. Mild  levoconvex scoliosis centered at L2-L3. Focal degenerative disc disease at L5-S1. Degenerative changes are also present at T11-T12. No lytic or blastic osseous lesion. The visualized bowel gas pattern is unremarkable. IMPRESSION: 1. Mild levoconvex scoliosis centered at L2-L3. 2. L5-S1 degenerative disc disease with loss of disc space height, endplate sclerosis and osteophyte formation. 3. Mild degenerative changes in the lower thoracic spine. Electronically Signed   By: Malachy MoanHeath  McCullough M.D.   On: 02/06/2017 10:26    Assessment & Plan:   Cammie McgeeHenrietta was seen today for headache and back pain.  Diagnoses and all orders for this visit:  Chronic midline low back pain without sciatica -     Diclofenac Sodium (PENNSAID) 2 % SOLN; Place 1 application onto the skin 2 (two) times daily.  DDD (degenerative disc disease), lumbosacral -     Diclofenac Sodium (PENNSAID) 2 % SOLN; Place 1 application onto the skin 2 (two) times daily.  Chronic intractable headache, unspecified headache type -     aspirin-acetaminophen-caffeine (EXCEDRIN MIGRAINE) 250-250-65 MG tablet; Take 1 tablet by mouth every 8 (eight) hours as needed for headache. With food  Chronic pansinusitis -     fluticasone (FLONASE) 50 MCG/ACT nasal spray; Place 2 sprays into both nostrils daily. -     guaiFENesin (MUCINEX) 600 MG 12 hr tablet; Take 1 tablet (600 mg total) by mouth 2 (two) times daily as needed for cough or to loosen phlegm. -     methylPREDNISolone acetate (DEPO-MEDROL) injection 40 mg   I am having Jniyah G. Sulak start on Diclofenac Sodium, fluticasone, guaiFENesin, and aspirin-acetaminophen-caffeine. I am also having her maintain her PHILLIPS COLON HEALTH, polycarbophil, multivitamin, diphenhydrAMINE, acetaminophen, Biotin, omeprazole, famotidine, and tiZANidine. We administered methylPREDNISolone acetate.  Meds ordered this encounter  Medications  . Diclofenac Sodium (PENNSAID) 2 % SOLN    Sig: Place 1  application onto the skin 2 (two) times daily.    Dispense:  112 g    Refill:  1    Order Specific Question:   Supervising Provider    Answer:   Dianne DunARON, TALIA M [[id number]]  . fluticasone (FLONASE) 50 MCG/ACT nasal spray    Sig: Place 2 sprays into both nostrils daily.    Dispense:  16 g    Refill:  0    Order Specific Question:   Supervising Provider    Answer:   Dianne DunARON, TALIA M [[id number]]  . guaiFENesin (MUCINEX) 600 MG 12 hr tablet    Sig: Take 1 tablet (600 mg total) by mouth 2 (two) times daily as needed for cough or to loosen phlegm.    Dispense:  14 tablet    Refill:  0    Order Specific Question:   Supervising Provider    Answer:   Dianne DunARON, TALIA M [[id number]]  . methylPREDNISolone acetate (DEPO-MEDROL) injection 40 mg  .  aspirin-acetaminophen-caffeine (EXCEDRIN MIGRAINE) 250-250-65 MG tablet    Sig: Take 1 tablet by mouth every 8 (eight) hours as needed for headache. With food    Dispense:  30 tablet    Refill:  0    Order Specific Question:   Supervising Provider    Answer:   Dianne DunARON, TALIA M [[id number]]    Follow-up: No Follow-up on file.  Alysia Pennaharlotte Blimy Napoleon, NP

## 2017-09-29 ENCOUNTER — Encounter: Payer: Self-pay | Admitting: Nurse Practitioner

## 2017-10-31 ENCOUNTER — Other Ambulatory Visit: Payer: Self-pay | Admitting: Nurse Practitioner

## 2017-10-31 DIAGNOSIS — J324 Chronic pansinusitis: Secondary | ICD-10-CM

## 2017-12-04 ENCOUNTER — Encounter: Payer: Self-pay | Admitting: Nurse Practitioner

## 2017-12-04 ENCOUNTER — Ambulatory Visit (INDEPENDENT_AMBULATORY_CARE_PROVIDER_SITE_OTHER): Payer: BLUE CROSS/BLUE SHIELD | Admitting: Nurse Practitioner

## 2017-12-04 VITALS — BP 120/60 | HR 97 | Temp 98.0°F | Ht 61.0 in | Wt 142.0 lb

## 2017-12-04 DIAGNOSIS — K529 Noninfective gastroenteritis and colitis, unspecified: Secondary | ICD-10-CM | POA: Diagnosis not present

## 2017-12-04 MED ORDER — ONDANSETRON HCL 4 MG PO TABS: 4.0000 mg | ORAL_TABLET | Freq: Three times a day (TID) | ORAL | 0 refills | Status: DC | PRN

## 2017-12-04 MED ORDER — BISMUTH SUBSALICYLATE 262 MG/15ML PO SUSP: 30.0000 mL | Freq: Four times a day (QID) | ORAL | 0 refills | Status: DC | PRN

## 2017-12-04 NOTE — Patient Instructions (Signed)
Push oral hydration with water and Gatorade.  Return to office if no improvement in 1week  Food Choices to Help Relieve Diarrhea, Adult When you have diarrhea, the foods you eat and your eating habits are very important. Choosing the right foods and drinks can help:  Relieve diarrhea.  Replace lost fluids and nutrients.  Prevent dehydration.  What general guidelines should I follow? Relieving diarrhea  Choose foods with less than 2 g or .07 oz. of fiber per serving.  Limit fats to less than 8 tsp (38 g or 1.34 oz.) a day.  Avoid the following: ? Foods and beverages sweetened with high-fructose corn syrup, honey, or sugar alcohols such as xylitol, sorbitol, and mannitol. ? Foods that contain a lot of fat or sugar. ? Fried, greasy, or spicy foods. ? High-fiber grains, breads, and cereals. ? Raw fruits and vegetables.  Eat foods that are rich in probiotics. These foods include dairy products such as yogurt and fermented milk products. They help increase healthy bacteria in the stomach and intestines (gastrointestinal tract, or GI tract).  If you have lactose intolerance, avoid dairy products. These may make your diarrhea worse.  Take medicine to help stop diarrhea (antidiarrheal medicine) only as told by your health care provider. Replacing nutrients  Eat small meals or snacks every 3-4 hours.  Eat bland foods, such as white rice, toast, or baked potato, until your diarrhea starts to get better. Gradually reintroduce nutrient-rich foods as tolerated or as told by your health care provider. This includes: ? Well-cooked protein foods. ? Peeled, seeded, and soft-cooked fruits and vegetables. ? Low-fat dairy products.  Take vitamin and mineral supplements as told by your health care provider. Preventing dehydration   Start by sipping water or a special solution to prevent dehydration (oral rehydration solution, ORS). Urine that is clear or pale yellow means that you are getting  enough fluid.  Try to drink at least 8-10 cups of fluid each day to help replace lost fluids.  You may add other liquids in addition to water, such as clear juice or decaffeinated sports drinks, as tolerated or as told by your health care provider.  Avoid drinks with caffeine, such as coffee, tea, or soft drinks.  Avoid alcohol. What foods are recommended? The items listed may not be a complete list. Talk with your health care provider about what dietary choices are best for you. Grains White rice. White, JamaicaFrench, or pita breads (fresh or toasted), including plain rolls, buns, or bagels. White pasta. Saltine, soda, or graham crackers. Pretzels. Low-fiber cereal. Cooked cereals made with water (such as cornmeal, farina, or cream cereals). Plain muffins. Matzo. Melba toast. Zwieback. Vegetables Potatoes (without the skin). Most well-cooked and canned vegetables without skins or seeds. Tender lettuce. Fruits Apple sauce. Fruits canned in juice. Cooked apricots, cherries, grapefruit, peaches, pears, or plums. Fresh bananas and cantaloupe. Meats and other protein foods Baked or boiled chicken. Eggs. Tofu. Fish. Seafood. Smooth nut butters. Ground or well-cooked tender beef, ham, veal, lamb, pork, or poultry. Dairy Plain yogurt, kefir, and unsweetened liquid yogurt. Lactose-free milk, buttermilk, skim milk, or soy milk. Low-fat or nonfat hard cheese. Beverages Water. Low-calorie sports drinks. Fruit juices without pulp. Strained tomato and vegetable juices. Decaffeinated teas. Sugar-free beverages not sweetened with sugar alcohols. Oral rehydration solutions, if approved by your health care provider. Seasoning and other foods Bouillon, broth, or soups made from recommended foods. What foods are not recommended? The items listed may not be a complete list. Talk with  your health care provider about what dietary choices are best for you. Grains Whole grain, whole wheat, bran, or rye breads, rolls,  pastas, and crackers. Wild or brown rice. Whole grain or bran cereals. Barley. Oats and oatmeal. Corn tortillas or taco shells. Granola. Popcorn. Vegetables Raw vegetables. Fried vegetables. Cabbage, broccoli, Brussels sprouts, artichokes, baked beans, beet greens, corn, kale, legumes, peas, sweet potatoes, and yams. Potato skins. Cooked spinach and cabbage. Fruits Dried fruit, including raisins and dates. Raw fruits. Stewed or dried prunes. Canned fruits with syrup. Meat and other protein foods Fried or fatty meats. Deli meats. Chunky nut butters. Nuts and seeds. Beans and lentils. Tomasa Blase. Hot dogs. Sausage. Dairy High-fat cheeses. Whole milk, chocolate milk, and beverages made with milk, such as milk shakes. Half-and-half. Cream. sour cream. Ice cream. Beverages Caffeinated beverages (such as coffee, tea, soda, or energy drinks). Alcoholic beverages. Fruit juices with pulp. Prune juice. Soft drinks sweetened with high-fructose corn syrup or sugar alcohols. High-calorie sports drinks. Fats and oils Butter. Cream sauces. Margarine. Salad oils. Plain salad dressings. Olives. Avocados. Mayonnaise. Sweets and desserts Sweet rolls, doughnuts, and sweet breads. Sugar-free desserts sweetened with sugar alcohols such as xylitol and sorbitol. Seasoning and other foods Honey. Hot sauce. Chili powder. Gravy. Cream-based or milk-based soups. Pancakes and waffles. Summary  When you have diarrhea, the foods you eat and your eating habits are very important.  Make sure you get at least 8-10 cups of fluid each day, or enough to keep your urine clear or pale yellow.  Eat bland foods and gradually reintroduce healthy, nutrient-rich foods as tolerated, or as told by your health care provider.  Avoid high-fiber, fried, greasy, or spicy foods. This information is not intended to replace advice given to you by your health care provider. Make sure you discuss any questions you have with your health care  provider. Document Released: 12/17/2003 Document Revised: 09/23/2016 Document Reviewed: 09/23/2016 Elsevier Interactive Patient Education  [employer].

## 2017-12-04 NOTE — Progress Notes (Signed)
Subjective:  Patient ID: Sandra Curtis, female    DOB: July 11, 1956  Age: 62 y.o. MRN: [id number]  CC: Diarrhea (diarrhea/2 days/ stop at fast food--had headache,throwingup and vomit since Saturday/ work note?)   GI Problem  The primary symptoms include fatigue, abdominal pain, nausea, vomiting and diarrhea. Primary symptoms do not include fever, weight loss, melena, hematemesis, jaundice, hematochezia, dysuria, myalgias, arthralgias or rash. The illness began 2 days ago. The onset was sudden. The problem has been gradually improving.  The illness is also significant for anorexia. The illness does not include chills, dysphagia, odynophagia, bloating, constipation, tenesmus, back pain or itching. Significant associated medical issues include GERD. Associated medical issues do not include irritable bowel syndrome. Risk factors: consumption of fish sandwich from [employer] on Friday.   Outpatient Medications Prior to Visit  Medication Sig Dispense Refill  . acetaminophen (TYLENOL) 500 MG tablet Take 2 tablets (1,000 mg total) by mouth every 8 (eight) hours as needed for moderate pain or headache. 30 tablet 0  . aspirin-acetaminophen-caffeine (EXCEDRIN MIGRAINE) 250-250-65 MG tablet Take 1 tablet by mouth every 8 (eight) hours as needed for headache. With food 30 tablet 0  . Biotin 5000 MCG CAPS Take by mouth.    . Diclofenac Sodium (PENNSAID) 2 % SOLN Place 1 application onto the skin 2 (two) times daily. 112 g 1  . diphenhydrAMINE (BENADRYL ALLERGY) 25 MG tablet Take 1-2tabs at bedtime prn for sleep 30 tablet 0  . famotidine (PEPCID) 20 MG tablet Take 1 tablet (20 mg total) by mouth at bedtime. 90 tablet 3  . fluticasone (FLONASE) 50 MCG/ACT nasal spray SHAKE LIQUID AND USE 2 SPRAYS IN EACH NOSTRIL DAILY 16 g 3  . gabapentin (NEURONTIN) 100 MG capsule gabapentin 100 mg capsule    . Multiple Vitamin (MULTIVITAMIN) tablet Take 1 tablet by mouth daily.    Sandra Curtis. omeprazole (PRILOSEC) 20 MG capsule Take  1 capsule (20 mg total) by mouth daily. 90 capsule 3  . omeprazole (PRILOSEC) 20 MG capsule omeprazole 20 mg capsule,delayed release  TK 1 C PO QD    . polycarbophil (FIBERCON) 625 MG tablet Take 625 mg by mouth daily.    . Probiotic Product (PHILLIPS COLON HEALTH) CAPS Take 1 capsule by mouth daily.    Sandra Curtis. tiZANidine (ZANAFLEX) 4 MG capsule Take 1 capsule (4 mg total) by mouth 2 (two) times daily as needed for muscle spasms. 60 capsule 3  . guaiFENesin (MUCINEX) 600 MG 12 hr tablet Take 1 tablet (600 mg total) by mouth 2 (two) times daily as needed for cough or to loosen phlegm. (Patient not taking: Reported on 12/04/2017) 14 tablet 0   No facility-administered medications prior to visit.     ROS See HPI  Objective:  BP 120/60   Pulse 97   Temp 98 F (36.7 C)   Ht 5\' 1"  (1.549 m)   Wt 142 lb (64.4 kg)   SpO2 98%   BMI 26.83 kg/m   BP Readings from Last 3 Encounters:  12/04/17 120/60  09/27/17 140/70  06/08/17 128/64    Wt Readings from Last 3 Encounters:  12/04/17 142 lb (64.4 kg)  09/27/17 143 lb (64.9 kg)  06/08/17 145 lb (65.8 kg)    Physical Exam  Constitutional: She is oriented to person, place, and time. No distress.  Cardiovascular: Normal rate.  Pulmonary/Chest: Effort normal.  Abdominal: Soft. Bowel sounds are normal. She exhibits no distension. There is no tenderness.  Neurological: She is alert and oriented to  person, place, and time.  Skin: Skin is warm and dry.  Psychiatric: She has a normal mood and affect. Her behavior is normal.  Vitals reviewed.   Lab Results  Component Value Date   WBC 8.0 02/06/2017   HGB 11.4 (L) 02/06/2017   HCT 34.4 (L) 02/06/2017   PLT 259.0 02/06/2017   GLUCOSE 102 (H) 02/06/2017   CHOL 173 02/06/2017   TRIG 60.0 02/06/2017   HDL 58.80 02/06/2017   LDLCALC 102 (H) 02/06/2017   ALT 21 02/06/2017   AST 28 02/06/2017   NA 143 02/06/2017   K 4.4 02/06/2017   CL 107 02/06/2017   CREATININE 1.06 02/06/2017   BUN 14  02/06/2017   CO2 29 02/06/2017   TSH 0.88 02/06/2017    Dg Lumbar Spine 2-3 Views  Result Date: 02/06/2017 CLINICAL DATA:  62 year old female with chronic lumbar spine pain. No known injury. EXAM: LUMBAR SPINE - 2-3 VIEW COMPARISON:  None. FINDINGS: No evidence of acute fracture or malalignment. There are 5 non rib-bearing lumbar vertebra. Mild levoconvex scoliosis centered at L2-L3. Focal degenerative disc disease at L5-S1. Degenerative changes are also present at T11-T12. No lytic or blastic osseous lesion. The visualized bowel gas pattern is unremarkable. IMPRESSION: 1. Mild levoconvex scoliosis centered at L2-L3. 2. L5-S1 degenerative disc disease with loss of disc space height, endplate sclerosis and osteophyte formation. 3. Mild degenerative changes in the lower thoracic spine. Electronically Signed   By: Malachy Moan M.D.   On: 02/06/2017 10:26    Assessment & Plan:   Sandra Curtis was seen today for diarrhea.  Diagnoses and all orders for this visit:  Gastroenteritis -     ondansetron (ZOFRAN) 4 MG tablet; Take 1 tablet (4 mg total) by mouth every 8 (eight) hours as needed for nausea or vomiting. -     bismuth subsalicylate (PEPTO-BISMOL) 262 MG/15ML suspension; Take 30 mLs by mouth every 6 (six) hours as needed.   I am having Sandra Curtis start on ondansetron and bismuth subsalicylate. I am also having her maintain her PHILLIPS COLON HEALTH, polycarbophil, multivitamin, diphenhydrAMINE, acetaminophen, Biotin, omeprazole, famotidine, tiZANidine, Diclofenac Sodium, guaiFENesin, aspirin-acetaminophen-caffeine, fluticasone, gabapentin, and omeprazole.  Meds ordered this encounter  Medications  . ondansetron (ZOFRAN) 4 MG tablet    Sig: Take 1 tablet (4 mg total) by mouth every 8 (eight) hours as needed for nausea or vomiting.    Dispense:  20 tablet    Refill:  0    Order Specific Question:   Supervising Provider    Answer:   Dianne Dun [[id number]]  . bismuth subsalicylate  (PEPTO-BISMOL) 262 MG/15ML suspension    Sig: Take 30 mLs by mouth every 6 (six) hours as needed.    Dispense:  360 mL    Refill:  0    Order Specific Question:   Supervising Provider    Answer:   Dianne Dun [[id number]]   Follow-up: Return if symptoms worsen or fail to improve.  Alysia Penna, NP

## 2017-12-06 ENCOUNTER — Telehealth: Payer: Self-pay | Admitting: Nurse Practitioner

## 2017-12-06 DIAGNOSIS — R197 Diarrhea, unspecified: Secondary | ICD-10-CM

## 2017-12-06 NOTE — Telephone Encounter (Signed)
Copied from CRM [id number]. Topic: Quick Communication - See Telephone Encounter >> Dec 06, 2017  8:41 AM Sandra Curtis, Hadessah Grennan, RMA wrote: CRM for notification. See Telephone encounter for:   12/06/17.pt would like a call back to discuss her symptoms and whether she should return to work [phone]

## 2017-12-06 NOTE — Progress Notes (Signed)
Pt came to pick up stool collection vial for gastrointestinal panel lab order. Pt left with hat, gloves, biohazard bag, cary-blair collection vial, and pt instructions regarding collection and return of sample. -DMG

## 2017-12-06 NOTE — Telephone Encounter (Signed)
Pt stating she is not feeling any better, pt report of dark/green watery stool and some headache. She is not able to go to work. Please advise. Last of 12/04/17

## 2017-12-06 NOTE — Telephone Encounter (Signed)
Pt is aware, advise pt to come in today if possible.

## 2017-12-07 ENCOUNTER — Encounter: Payer: Self-pay | Admitting: Family Medicine

## 2017-12-07 ENCOUNTER — Ambulatory Visit (INDEPENDENT_AMBULATORY_CARE_PROVIDER_SITE_OTHER)
Admission: RE | Admit: 2017-12-07 | Discharge: 2017-12-07 | Disposition: A | Discharge status: Home / Self Care | Payer: BLUE CROSS/BLUE SHIELD | Source: Ambulatory Visit | Attending: Family Medicine | Admitting: Family Medicine

## 2017-12-07 ENCOUNTER — Ambulatory Visit (INDEPENDENT_AMBULATORY_CARE_PROVIDER_SITE_OTHER): Payer: BLUE CROSS/BLUE SHIELD | Admitting: Family Medicine

## 2017-12-07 ENCOUNTER — Ambulatory Visit: Payer: Self-pay

## 2017-12-07 VITALS — BP 124/78 | HR 75 | Temp 98.7°F | Ht 61.0 in | Wt 143.0 lb

## 2017-12-07 DIAGNOSIS — R197 Diarrhea, unspecified: Secondary | ICD-10-CM | POA: Insufficient documentation

## 2017-12-07 NOTE — Telephone Encounter (Signed)
Pt. called to report decreased appetite, weakness, and vomiting.  Saw Charlotte Nche on 12/04/17 for same symptoms, but voice concern for new symptoms of abdominal bloating, "like I'm 3 mos.  Pregnant."  Stated she tried to eat "Oodles of Noodles" this morning, and vomited it right back up.  Reported has drank Gatorade, about 8 oz, and was able to keep it down.  Reported, yesterday, she had one dark green, watery stool, and then had a water, dark black stool.  Reported last stool was yesterday at about 11:00 AM.  Stated she has not been able to have a BM since.  Stated she feels weak,  Denied dizziness, chest pain, or shortness of breath.  Has an appt. tomorrow with Alysia Pennaharlotte Nche.   [facility]  Spoke with [employer].  Advised they have no openings.  Advised to try to get pt. Into another [place] office today.    Appt. Scheduled with Clare GandyJeremy Schmitz today at 3:20 PM.  Pt. Agreed with plan.           Reason for Disposition . [1] MODERATE weakness (i.e., interferes with work, school, normal activities) AND [2] persists > 3 days  Answer Assessment - Initial Assessment Questions 1. DESCRIPTION: "Describe how you are feeling." Weakness and lightheaded      2. SEVERITY: "How bad is it?"  "Can you stand and walk?"   - MILD - Feels weak or tired, but does not interfere with work, school or normal activities   - MODERATE - Able to stand and walk; weakness interferes with work, school, or normal activities   - SEVERE - Unable to stand or walk     Mild weakness; a little improved since drinking Gatorade 3. ONSET:  "When did the weakness begin?"     On Saturday, 2/23 4. CAUSE: "What do you think is causing the weakness?"     Vomiting and diarrhea 5. MEDICINES: "Have you recently started a new medicine or had a change in the amount of a medicine?"     no 6. OTHER SYMPTOMS: "Do you have any other symptoms?" (e.g., chest pain, fever, cough, SOB, vomiting, diarrhea, bleeding)     Abdomen  bloated since last night, passing gas, abd. Painful with pressing middle to lower right side, vomited on Saturday all day, freq. diarrhea; stool was dark green, now black; denied fever/ chills     7. PREGNANCY: "Is there any chance you are pregnant?" "When was your last menstrual period?"     n/a  Protocols used: WEAKNESS (GENERALIZED) AND FATIGUE-A-AH

## 2017-12-07 NOTE — Assessment & Plan Note (Signed)
Her symptoms seem to be resolving. Thought her symptoms were associated with food she ate on Friday. Feels distended but soft and having flatus.  - ab xray - if has increasing pain or still having no bowel movements advised to be seen in the ED - has follow up tomorrow.

## 2017-12-07 NOTE — Progress Notes (Signed)
Sandra Curtis - 62 y.o. female MRN [id number]  Date of birth: 02/05/1956  SUBJECTIVE:  Including CC & ROS.  Chief Complaint  Patient presents with  . Emesis    Sandra Curtis is a 62 y.o. female that is presenting with vomiting and diarrhea. Symptoms have been ongoing for four days. Denies fevers and body aches. She has not been around with similar symptoms. Admits to right lower abdominal pain. Denies blood in her stool. Denies body aches or fevers. She has been drinking Pedialyte to stay hydrated. Admits to decrease appetite. She has not had a stool since yesterday morning. She has a mild amount of abdominal pain on the left side. She feels that her abdomen is mildly distended. Is still having flatus. Has not been able to provide a sample for the GI path panel. Historically she is usually constipated. Has to take fiber to have a movement at times.    Review of Systems  Constitutional: Negative for fever.  HENT: Negative for congestion.   Respiratory: Negative for cough.   Cardiovascular: Negative for chest pain.  Gastrointestinal: Positive for abdominal pain and diarrhea.  Musculoskeletal: Negative for arthralgias.  Skin: Negative for color change.  Allergic/Immunologic: Negative for immunocompromised state.  Neurological: Negative for weakness.  Hematological: Negative for adenopathy.  Psychiatric/Behavioral: Negative for agitation.    HISTORY: Past Medical, Surgical, Social, and Family History Reviewed & Updated per EMR.   Pertinent Historical Findings include:  Past Medical History:  Diagnosis Date  . Allergic rhinitis   . Allergy   . Depression   . Esophageal stricture   . GERD (gastroesophageal reflux disease)   . Inflammatory polyps of colon (HCC) 2010  . Systolic murmur   . UTI (urinary tract infection)     No past surgical history on file.  Allergies  Allergen Reactions  . Iodine Hives    Hives     Family History  Problem Relation Age of Onset    . Arthritis Mother   . Thyroid disease Mother   . Heart disease Father   . Hypertension Father   . Diabetes Father   . Arthritis Sister   . Colon cancer Neg Hx   . Stomach cancer Neg Hx      Social History   Socioeconomic History  . Marital status: Single    Spouse name: Not on file  . Number of children: 1  . Years of education: Not on file  . Highest education level: Not on file  Social Needs  . Financial resource strain: Not on file  . Food insecurity - worry: Not on file  . Food insecurity - inability: Not on file  . Transportation needs - medical: Not on file  . Transportation needs - non-medical: Not on file  Occupational History  . 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 Smoking status: Former Smoker    Packs/day: 0.50    Years: 35.00    Pack years: 17.50    Last attempt to quit: 06/10/2013    Years since quitting: 4.4  . Smokeless tobacco: Never Used  Substance and Sexual Activity  . Alcohol use: Yes    Comment: occasionally  . Drug use: No  . Sexual activity: Not on file  Other Topics Concern  . Not on file  Social History Narrative  . Not on file     PHYSICAL EXAM:  VS: BP 124/78 (BP Location: Left Arm, Patient Position: Sitting, Cuff Size: Normal)  Pulse 75   Temp 98.7 F (37.1 C) (Oral)   Ht 5\' 1"  (1.549 m)   Wt 143 lb (64.9 kg)   SpO2 97%   BMI 27.02 kg/m  Physical Exam Gen: NAD, alert, cooperative with exam, well-appearing ENT: normal lips, normal nasal mucosa,  Eye: normal EOM, normal conjunctiva and lids CV:  no edema, +2 pedal pulses, S1S2   Resp: no accessory muscle use, non-labored, no wheezing  GI: no masses, mild ttp in the right side of the abdomen, negative Murphy sign, no hernia, soft, +BS, mild distention  Skin: no rashes, no areas of induration  Neuro: normal tone, normal sensation to touch Psych:  normal insight, alert and oriented MSK: normal gait, normal strength      ASSESSMENT & PLAN:    Diarrhea Her symptoms seem to be resolving. Thought her symptoms were associated with food she ate on Friday. Feels distended but soft and having flatus.  - ab xray - if has increasing pain or still having no bowel movements advised to be seen in the ED - has follow up tomorrow.

## 2017-12-07 NOTE — Patient Instructions (Addendum)
We will call you with the results from today.  Please try to get a sample for [place].

## 2017-12-08 ENCOUNTER — Ambulatory Visit (INDEPENDENT_AMBULATORY_CARE_PROVIDER_SITE_OTHER): Payer: BLUE CROSS/BLUE SHIELD | Admitting: Nurse Practitioner

## 2017-12-08 ENCOUNTER — Encounter: Payer: Self-pay | Admitting: *Deleted

## 2017-12-08 ENCOUNTER — Telehealth: Payer: Self-pay | Admitting: Family Medicine

## 2017-12-08 ENCOUNTER — Encounter: Payer: Self-pay | Admitting: Nurse Practitioner

## 2017-12-08 VITALS — BP 130/66 | HR 70 | Temp 98.0°F | Ht 61.0 in | Wt 149.0 lb

## 2017-12-08 DIAGNOSIS — K2100 Gastro-esophageal reflux disease with esophagitis, without bleeding: Secondary | ICD-10-CM

## 2017-12-08 DIAGNOSIS — G8929 Other chronic pain: Secondary | ICD-10-CM | POA: Diagnosis not present

## 2017-12-08 DIAGNOSIS — E119 Type 2 diabetes mellitus without complications: Secondary | ICD-10-CM

## 2017-12-08 DIAGNOSIS — M5137 Other intervertebral disc degeneration, lumbosacral region: Secondary | ICD-10-CM

## 2017-12-08 DIAGNOSIS — Z13220 Encounter for screening for lipoid disorders: Secondary | ICD-10-CM

## 2017-12-08 DIAGNOSIS — K5900 Constipation, unspecified: Secondary | ICD-10-CM | POA: Diagnosis not present

## 2017-12-08 DIAGNOSIS — Z0001 Encounter for general adult medical examination with abnormal findings: Secondary | ICD-10-CM | POA: Diagnosis not present

## 2017-12-08 DIAGNOSIS — Z23 Encounter for immunization: Secondary | ICD-10-CM

## 2017-12-08 DIAGNOSIS — D649 Anemia, unspecified: Secondary | ICD-10-CM | POA: Diagnosis not present

## 2017-12-08 DIAGNOSIS — M545 Low back pain: Secondary | ICD-10-CM

## 2017-12-08 DIAGNOSIS — Z136 Encounter for screening for cardiovascular disorders: Secondary | ICD-10-CM | POA: Diagnosis not present

## 2017-12-08 LAB — CBC
HCT: 32.9 % — ABNORMAL LOW (ref 36.0–46.0)
Hemoglobin: 11 g/dL — ABNORMAL LOW (ref 12.0–15.0)
MCHC: 33.5 g/dL (ref 30.0–36.0)
MCV: 93.2 fl (ref 78.0–100.0)
PLATELETS: 240 10*3/uL (ref 150.0–400.0)
RBC: 3.53 Mil/uL — ABNORMAL LOW (ref 3.87–5.11)
RDW: 13.2 % (ref 11.5–15.5)
WBC: 6.1 10*3/uL (ref 4.0–10.5)

## 2017-12-08 LAB — COMPREHENSIVE METABOLIC PANEL
ALT: 26 U/L (ref 0–35)
AST: 32 U/L (ref 0–37)
Albumin: 4 g/dL (ref 3.5–5.2)
Alkaline Phosphatase: 42 U/L (ref 39–117)
BUN: 8 mg/dL (ref 6–23)
CHLORIDE: 102 meq/L (ref 96–112)
CO2: 33 mEq/L — ABNORMAL HIGH (ref 19–32)
Calcium: 9.5 mg/dL (ref 8.4–10.5)
Creatinine, Ser: 0.77 mg/dL (ref 0.40–1.20)
GFR: 97.78 mL/min (ref >60.00)
GLUCOSE: 92 mg/dL (ref 70–99)
POTASSIUM: 3.3 meq/L — AB (ref 3.5–5.1)
Sodium: 143 mEq/L (ref 135–145)
Total Bilirubin: 0.4 mg/dL (ref 0.2–1.2)
Total Protein: 7.4 g/dL (ref 6.0–8.3)

## 2017-12-08 LAB — LIPID PANEL
CHOL/HDL RATIO: 3
Cholesterol: 127 mg/dL (ref 0–200)
HDL: 41.6 mg/dL (ref >39.00)
LDL CALC: 71 mg/dL (ref 0–99)
NonHDL: 85.6
Triglycerides: 74 mg/dL (ref 0.0–149.0)
VLDL: 14.8 mg/dL (ref 0.0–40.0)

## 2017-12-08 LAB — HEMOGLOBIN A1C: HEMOGLOBIN A1C: 6.5 % (ref 4.6–6.5)

## 2017-12-08 LAB — IBC PANEL
IRON: 103 ug/dL (ref 42–145)
SATURATION RATIOS: 27.1 % (ref 20.0–50.0)
TRANSFERRIN: 271 mg/dL (ref 212.0–360.0)

## 2017-12-08 LAB — B12 AND FOLATE PANEL
Folate: >23.6 ng/mL (ref >5.9)
VITAMIN B 12: 764 pg/mL (ref 211–911)

## 2017-12-08 MED ORDER — TIZANIDINE HCL 4 MG PO CAPS: 4.0000 mg | ORAL_CAPSULE | Freq: Two times a day (BID) | ORAL | 3 refills | Status: DC | PRN

## 2017-12-08 MED ORDER — FAMOTIDINE 20 MG PO TABS: 20.0000 mg | ORAL_TABLET | Freq: Every day | ORAL | 3 refills | Status: DC

## 2017-12-08 NOTE — Telephone Encounter (Signed)
Left VM for patient. If she calls back please have her speak with a nurse/CMA and inform her xray was normal.   If any questions then please take the best time and phone number to call and I will try to call her back.   Myra RudeSchmitz, Gyneth Hubka E, MD [facility] 12/08/2017, 8:33 AM

## 2017-12-08 NOTE — Progress Notes (Signed)
Subjective:    Patient ID: Sandra Curtis, female    DOB: March 23, 1956, 62 y.o.   MRN: [id number]  Patient presents today for complete physical  HPI  Improved ABD bloating and nausea. Diarrhea has resolved.  Immunizations: (TDAP, Hep C screen, Pneumovax, Influenza, zoster)  Health Maintenance  Topic Date Due  . Pneumococcal vaccine (1) 03/23/1958  . Complete foot exam   03/23/1966  . Eye exam for diabetics  03/23/1966  . Flu Shot  06/08/2018*  . Mammogram  06/06/2018  . Hemoglobin A1C  06/10/2018  . Pap Smear  05/19/2020  . Colon Cancer Screening  01/04/2024  . Tetanus Vaccine  12/09/2027  .  Hepatitis C: One time screening is recommended by Center for Disease Control  (CDC) for  adults born from [birth year] through 1965.   Completed  . HIV Screening  Completed  *Topic was postponed. The date shown is not the original due date.   Diet: regular.  Weight:  Wt Readings from Last 3 Encounters:  12/08/17 149 lb (67.6 kg)  12/07/17 143 lb (64.9 kg)  12/04/17 142 lb (64.4 kg)   Exercise:none.  Fall Risk: Fall Risk  09/27/2017 02/06/2017  Falls in the past year? No No   Home Safety:home alone.  Depression/Suicide: Depression screen [facility] 12/08/2017 02/06/2017  Decreased Interest 0 0  Down, Depressed, Hopeless 0 0  PHQ - 2 Score 0 0   Pap Smear (every 4951yrs for >21-29 without HPV, every 7222yrs for >30-41622yrs with HPV):up to date with Dr. Chestine Sporelark. Last done 2018 (normal per patient)  Mammogram (yearly, >49422yrs):up to date  Vision:up to date  Dental:up yo date.  Advanced Directive: Advanced Directives 03/01/2017  Does Patient Have a Medical Advance Directive? No  Would patient like information on creating a medical advance directive? No - Patient declined    Medications and allergies reviewed with patient and updated if appropriate.  Patient Active Problem List   Diagnosis Date Noted  . Type 2 diabetes mellitus without complication, without long-term current use of  insulin (HCC) 12/11/2017  . Anemia 12/11/2017  . Diarrhea 12/07/2017  . Insomnia 02/06/2017  . Gastroesophageal reflux disease with esophagitis 02/06/2017  . DDD (degenerative disc disease), lumbosacral 02/06/2017  . Constipation 02/06/2017  . Dysphagia 02/06/2017  . Scoliosis of lumbar spine 02/06/2017  . Chronic midline low back pain without sciatica 02/06/2017  . Systolic murmur 10/02/2013  . Routine general medical examination at a health care facility 07/11/2012  . Headache 03/01/2012  . Seasonal allergic rhinitis 03/01/2012    Current Outpatient Medications on File Prior to Visit  Medication Sig Dispense Refill  . acetaminophen (TYLENOL) 500 MG tablet Take 2 tablets (1,000 mg total) by mouth every 8 (eight) hours as needed for moderate pain or headache. 30 tablet 0  . aspirin-acetaminophen-caffeine (EXCEDRIN MIGRAINE) 250-250-65 MG tablet Take 1 tablet by mouth every 8 (eight) hours as needed for headache. With food 30 tablet 0  . Biotin 5000 MCG CAPS Take by mouth.    . Diclofenac Sodium (PENNSAID) 2 % SOLN Place 1 application onto the skin 2 (two) times daily. 112 g 1  . diphenhydrAMINE (BENADRYL ALLERGY) 25 MG tablet Take 1-2tabs at bedtime prn for sleep 30 tablet 0  . fluticasone (FLONASE) 50 MCG/ACT nasal spray SHAKE LIQUID AND USE 2 SPRAYS IN EACH NOSTRIL DAILY 16 g 3  . guaiFENesin (MUCINEX) 600 MG 12 hr tablet Take 1 tablet (600 mg total) by mouth 2 (two) times daily as needed for cough  or to loosen phlegm. 14 tablet 0  . Multiple Vitamin (MULTIVITAMIN) tablet Take 1 tablet by mouth daily.    . polycarbophil (FIBERCON) 625 MG tablet Take 625 mg by mouth daily.    . Probiotic Product (PHILLIPS COLON HEALTH) CAPS Take 1 capsule by mouth daily.     No current facility-administered medications on file prior to visit.     Past Medical History:  Diagnosis Date  . Allergic rhinitis   . Allergy   . Depression   . Esophageal stricture   . GERD (gastroesophageal reflux  disease)   . Inflammatory polyps of colon (HCC) 2010  . Systolic murmur   . UTI (urinary tract infection)     History reviewed. No pertinent surgical history.  Social History   Socioeconomic History  . Marital status: Single    Spouse name: None  . Number of children: 1  . Years of education: None  . Highest education level: None  Social Needs  . Financial resource strain: None  . Food insecurity - worry: None  . Food insecurity - inability: None  . Transportation needs - medical: None  . Transportation needs - non-medical: None  Occupational History  . Occupation: Biomedical engineer: paperworks  Tobacco Use  . Smoking status: Former Smoker    Packs/day: 0.50    Years: 35.00    Pack years: 17.50    Last attempt to quit: 06/10/2013    Years since quitting: 4.5  . Smokeless tobacco: Never Used  Substance and Sexual Activity  . Alcohol use: Yes    Comment: occasionally  . Drug use: No  . Sexual activity: None  Other Topics Concern  . None  Social History Narrative  . None    Family History  Problem Relation Age of Onset  . Arthritis Mother   . Thyroid disease Mother   . Heart disease Father   . Hypertension Father   . Diabetes Father   . Arthritis Sister   . Colon cancer Neg Hx   . Stomach cancer Neg Hx         Review of Systems  Constitutional: Negative for fever, malaise/fatigue and weight loss.  HENT: Negative for congestion and sore throat.   Eyes:       Negative for visual changes  Respiratory: Negative for cough and shortness of breath.   Cardiovascular: Negative for chest pain, palpitations and leg swelling.  Gastrointestinal: Negative for blood in stool, constipation, diarrhea and heartburn.  Genitourinary: Negative for dysuria, frequency and urgency.  Musculoskeletal: Negative for falls, joint pain and myalgias.  Skin: Negative for rash.  Neurological: Negative for dizziness, sensory change and headaches.  Endo/Heme/Allergies: Does not  bruise/bleed easily.  Psychiatric/Behavioral: Negative for depression, substance abuse and suicidal ideas. The patient is not nervous/anxious.     Objective:   Vitals:   12/08/17 1001  BP: 130/66  Pulse: 70  Temp: 98 F (36.7 C)  SpO2: 98%    Body mass index is 28.15 kg/m.  Physical Examination:  Physical Exam  Constitutional: She is oriented to person, place, and time and well-developed, well-nourished, and in no distress. No distress.  HENT:  Right Ear: External ear normal.  Left Ear: External ear normal.  Nose: Nose normal.  Mouth/Throat: Oropharynx is clear and moist. No oropharyngeal exudate.  Eyes: Conjunctivae and EOM are normal. Pupils are equal, round, and reactive to light. No scleral icterus.  Neck: Normal range of motion. Neck supple. No thyromegaly present.  Cardiovascular: Normal  rate, normal heart sounds and intact distal pulses.  Pulmonary/Chest: Effort normal and breath sounds normal. She exhibits no tenderness.  Abdominal: Soft. Bowel sounds are normal. She exhibits no distension. There is no tenderness.  Genitourinary:  Genitourinary Comments: Deferred breast and pelvic exam to GYN per patient  Musculoskeletal: Normal range of motion. She exhibits no edema or tenderness.  Lymphadenopathy:    She has no cervical adenopathy.  Neurological: She is alert and oriented to person, place, and time. Gait normal.  Skin: Skin is warm and dry.  Psychiatric: Affect and judgment normal.    ASSESSMENT and PLAN:  Dalexa was seen today for annual exam.  Diagnoses and all orders for this visit:  Encounter for preventative adult health care exam with abnormal findings -     Lipid panel -     Comprehensive metabolic panel  Constipation, unspecified constipation type  Anemia, unspecified type -     IBC panel -     CBC -     B12 and Folate Panel  Type 2 diabetes mellitus without complication, without long-term current use of insulin (HCC) -     Hemoglobin  A1c  Encounter for lipid screening for cardiovascular disease -     Lipid panel  Need for diphtheria-tetanus-pertussis (Tdap) vaccine -     Tdap vaccine greater than or equal to 7yo IM  DDD (degenerative disc disease), lumbosacral -     tiZANidine (ZANAFLEX) 4 MG capsule; Take 1 capsule (4 mg total) by mouth 2 (two) times daily as needed for muscle spasms.  Chronic midline low back pain without sciatica -     tiZANidine (ZANAFLEX) 4 MG capsule; Take 1 capsule (4 mg total) by mouth 2 (two) times daily as needed for muscle spasms.  Gastroesophageal reflux disease with esophagitis -     famotidine (PEPCID) 20 MG tablet; Take 1 tablet (20 mg total) by mouth at bedtime.   No problem-specific Assessment & Plan notes found for this encounter.    Recent Results (from the past 2160 hour(s))  IBC panel     Status: None   Collection Time: 12/08/17 10:50 AM  Result Value Ref Range   Iron 103 42 - 145 ug/dL   Transferrin 409.8 119.1 - 360.0 mg/dL   Saturation Ratios 47.8 20.0 - 50.0 %  CBC     Status: Abnormal   Collection Time: 12/08/17 10:50 AM  Result Value Ref Range   WBC 6.1 4.0 - 10.5 K/uL   RBC 3.53 (L) 3.87 - 5.11 Mil/uL   Platelets 240.0 150.0 - 400.0 K/uL   Hemoglobin 11.0 (L) 12.0 - 15.0 g/dL   HCT 29.5 (L) 62.1 - 30.8 %   MCV 93.2 78.0 - 100.0 fl   MCHC 33.5 30.0 - 36.0 g/dL   RDW 65.7 84.6 - 96.2 %  B12 and Folate Panel     Status: None   Collection Time: 12/08/17 10:50 AM  Result Value Ref Range   Vitamin B-12 764 211 - 911 pg/mL   Folate >23.6 >5.9 ng/mL  Lipid panel     Status: None   Collection Time: 12/08/17 10:50 AM  Result Value Ref Range   Cholesterol 127 0 - 200 mg/dL    Comment: ATP III Classification       Desirable:  < 200 mg/dL               Borderline High:  200 - 239 mg/dL          High:  > =  240 mg/dL   Triglycerides 16.1 0.0 - 149.0 mg/dL    Comment: Normal:  <096 mg/dLBorderline High:  150 - 199 mg/dL   HDL 04.54 >09.81 mg/dL   VLDL 19.1 0.0 -  47.8 mg/dL   LDL Cholesterol 71 0 - 99 mg/dL   Total CHOL/HDL Ratio 3     Comment:                Men          Women1/2 Average Risk     3.4          3.3Average Risk          5.0          4.42X Average Risk          9.6          7.13X Average Risk          15.0          11.0                       NonHDL 85.60     Comment: NOTE:  Non-HDL goal should be 30 mg/dL higher than patient's LDL goal (i.e. LDL goal of < 70 mg/dL, would have non-HDL goal of < 100 mg/dL)  Hemoglobin G9F     Status: None   Collection Time: 12/08/17 10:50 AM  Result Value Ref Range   Hgb A1c MFr Bld 6.5 4.6 - 6.5 %    Comment: Glycemic Control Guidelines for People with Diabetes:Non Diabetic:  <6%Goal of Therapy: <7%Additional Action Suggested:  >8%   Comprehensive metabolic panel     Status: Abnormal   Collection Time: 12/08/17 10:50 AM  Result Value Ref Range   Sodium 143 135 - 145 mEq/L   Potassium 3.3 (L) 3.5 - 5.1 mEq/L   Chloride 102 96 - 112 mEq/L   CO2 33 (H) 19 - 32 mEq/L   Glucose, Bld 92 70 - 99 mg/dL   BUN 8 6 - 23 mg/dL   Creatinine, Ser 6.21 0.40 - 1.20 mg/dL   Total Bilirubin 0.4 0.2 - 1.2 mg/dL   Alkaline Phosphatase 42 39 - 117 U/L   AST 32 0 - 37 U/L   ALT 26 0 - 35 U/L   Total Protein 7.4 6.0 - 8.3 g/dL   Albumin 4.0 3.5 - 5.2 g/dL   Calcium 9.5 8.4 - 30.8 mg/dL   GFR 65.78 >46.96 mL/min   Follow up: Return in about 6 months (around 06/10/2018) for DM (repeat HgbA1c).  Alysia Penna, NP

## 2017-12-08 NOTE — Patient Instructions (Addendum)
Stable iron panel, B12, folate, lipid panel, CBC. CMP indicates mild decrease in potassium as result of recent diarrhea. Encourage use of Gatorade or pedialyte, and potassium rich foods.. HgbA1c of 6.5 indicates diabetes. Need to maintain DASH diet and increase exercise. Will need medication if no improvement in 65month. F/up in 646monthfor repeat labs.  Health Maintenance, Female Adopting a healthy lifestyle and getting preventive care can go a long way to promote health and wellness. Talk with your health care provider about what schedule of regular examinations is right for you. This is a good chance for you to check in with your provider about disease prevention and staying healthy. In between checkups, there are plenty of things you can do on your own. Experts have done a lot of research about which lifestyle changes and preventive measures are most likely to keep you healthy. Ask your health care provider for more information. Weight and diet Eat a healthy diet  Be sure to include plenty of vegetables, fruits, low-fat dairy products, and lean protein.  Do not eat a lot of foods high in solid fats, added sugars, or salt.  Get regular exercise. This is one of the most important things you can do for your health. ? Most adults should exercise for at least 150 minutes each week. The exercise should increase your heart rate and make you sweat (moderate-intensity exercise). ? Most adults should also do strengthening exercises at least twice a week. This is in addition to the moderate-intensity exercise.  Maintain a healthy weight  Body mass index (BMI) is a measurement that can be used to identify possible weight problems. It estimates body fat based on height and weight. Your health care provider can help determine your BMI and help you achieve or maintain a healthy weight.  For females 2065ears of age and older: ? A BMI below 18.5 is considered underweight. ? A BMI of 18.5 to 24.9 is  normal. ? A BMI of 25 to 29.9 is considered overweight. ? A BMI of 30 and above is considered obese.  Watch levels of cholesterol and blood lipids  You should start having your blood tested for lipids and cholesterol at 2059ears of age, then have this test every 5 years.  You may need to have your cholesterol levels checked more often if: ? Your lipid or cholesterol levels are high. ? You are older than 5064ears of age. ? You are at high risk for heart disease.  Cancer screening Lung Cancer  Lung cancer screening is recommended for adults [id number] old who are at high risk for lung cancer because of a history of smoking.  A yearly low-dose CT scan of the lungs is recommended for people who: ? Currently smoke. ? Have quit within the past 15 years. ? Have at least a 30-pack-year history of smoking. A pack year is smoking an average of one pack of cigarettes a day for 1 year.  Yearly screening should continue until it has been 15 years since you quit.  Yearly screening should stop if you develop a health problem that would prevent you from having lung cancer treatment.  Breast Cancer  Practice breast self-awareness. This means understanding how your breasts normally appear and feel.  It also means doing regular breast self-exams. Let your health care provider know about any changes, no matter how small.  If you are in your 20s or 30s, you should have a clinical breast exam (CBE) by a health care provider  every 1-3 years as part of a regular health exam.  If you are 63 or older, have a CBE every year. Also consider having a breast X-ray (mammogram) every year.  If you have a family history of breast cancer, talk to your health care provider about genetic screening.  If you are at high risk for breast cancer, talk to your health care provider about having an MRI and a mammogram every year.  Breast cancer gene (BRCA) assessment is recommended for women who have family members  with BRCA-related cancers. BRCA-related cancers include: ? Breast. ? Ovarian. ? Tubal. ? Peritoneal cancers.  Results of the assessment will determine the need for genetic counseling and BRCA1 and BRCA2 testing.  Cervical Cancer Your health care provider may recommend that you be screened regularly for cancer of the pelvic organs (ovaries, uterus, and vagina). This screening involves a pelvic examination, including checking for microscopic changes to the surface of your cervix (Pap test). You may be encouraged to have this screening done every 3 years, beginning at age 63.  For women ages 80-65, health care providers may recommend pelvic exams and Pap testing every 3 years, or they may recommend the Pap and pelvic exam, combined with testing for human papilloma virus (HPV), every 5 years. Some types of HPV increase your risk of cervical cancer. Testing for HPV may also be done on women of any age with unclear Pap test results.  Other health care providers may not recommend any screening for nonpregnant women who are considered low risk for pelvic cancer and who do not have symptoms. Ask your health care provider if a screening pelvic exam is right for you.  If you have had past treatment for cervical cancer or a condition that could lead to cancer, you need Pap tests and screening for cancer for at least 20 years after your treatment. If Pap tests have been discontinued, your risk factors (such as having a new sexual partner) need to be reassessed to determine if screening should resume. Some women have medical problems that increase the chance of getting cervical cancer. In these cases, your health care provider may recommend more frequent screening and Pap tests.  Colorectal Cancer  This type of cancer can be detected and often prevented.  Routine colorectal cancer screening usually begins at 62 years of age and continues through 62 years of age.  Your health care provider may recommend  screening at an earlier age if you have risk factors for colon cancer.  Your health care provider may also recommend using home test kits to check for hidden blood in the stool.  A small camera at the end of a tube can be used to examine your colon directly (sigmoidoscopy or colonoscopy). This is done to check for the earliest forms of colorectal cancer.  Routine screening usually begins at age 66.  Direct examination of the colon should be repeated every 5-10 years through 62 years of age. However, you may need to be screened more often if early forms of precancerous polyps or small growths are found.  Skin Cancer  Check your skin from head to toe regularly.  Tell your health care provider about any new moles or changes in moles, especially if there is a change in a mole's shape or color.  Also tell your health care provider if you have a mole that is larger than the size of a pencil eraser.  Always use sunscreen. Apply sunscreen liberally and repeatedly throughout the day.  Protect yourself by wearing long sleeves, pants, a wide-brimmed hat, and sunglasses whenever you are outside.  Heart disease, diabetes, and high blood pressure  High blood pressure causes heart disease and increases the risk of stroke. High blood pressure is more likely to develop in: ? People who have blood pressure in the high end of the normal range (130-139/85-89 mm Hg). ? People who are overweight or obese. ? People who are African American.  If you are 65-86 years of age, have your blood pressure checked every 3-5 years. If you are 51 years of age or older, have your blood pressure checked every year. You should have your blood pressure measured twice-once when you are at a hospital or clinic, and once when you are not at a hospital or clinic. Record the average of the two measurements. To check your blood pressure when you are not at a hospital or clinic, you can use: ? An automated blood pressure machine at  a pharmacy. ? A home blood pressure monitor.  If you are between 31 years and 56 years old, ask your health care provider if you should take aspirin to prevent strokes.  Have regular diabetes screenings. This involves taking a blood sample to check your fasting blood sugar level. ? If you are at a normal weight and have a low risk for diabetes, have this test once every three years after 62 years of age. ? If you are overweight and have a high risk for diabetes, consider being tested at a younger age or more often. Preventing infection Hepatitis B  If you have a higher risk for hepatitis B, you should be screened for this virus. You are considered at high risk for hepatitis B if: ? You were born in a country where hepatitis B is common. Ask your health care provider which countries are considered high risk. ? Your parents were born in a high-risk country, and you have not been immunized against hepatitis B (hepatitis B vaccine). ? You have HIV or AIDS. ? You use needles to inject street drugs. ? You live with someone who has hepatitis B. ? You have had sex with someone who has hepatitis B. ? You get hemodialysis treatment. ? You take certain medicines for conditions, including cancer, organ transplantation, and autoimmune conditions.  Hepatitis C  Blood testing is recommended for: ? Everyone born from [birth year] through 1965. ? Anyone with known risk factors for hepatitis C.  Sexually transmitted infections (STIs)  You should be screened for sexually transmitted infections (STIs) including gonorrhea and chlamydia if: ? You are sexually active and are younger than 62 years of age. ? You are older than 62 years of age and your health care provider tells you that you are at risk for this type of infection. ? Your sexual activity has changed since you were last screened and you are at an increased risk for chlamydia or gonorrhea. Ask your health care provider if you are at risk.  If you do  not have HIV, but are at risk, it may be recommended that you take a prescription medicine daily to prevent HIV infection. This is called pre-exposure prophylaxis (PrEP). You are considered at risk if: ? You are sexually active and do not regularly use condoms or know the HIV status of your partner(s). ? You take drugs by injection. ? You are sexually active with a partner who has HIV.  Talk with your health care provider about whether you are at high risk of  being infected with HIV. If you choose to begin PrEP, you should first be tested for HIV. You should then be tested every 3 months for as long as you are taking PrEP. Pregnancy  If you are premenopausal and you may become pregnant, ask your health care provider about preconception counseling.  If you may become pregnant, take 400 to 800 micrograms (mcg) of folic acid every day.  If you want to prevent pregnancy, talk to your health care provider about birth control (contraception). Osteoporosis and menopause  Osteoporosis is a disease in which the bones lose minerals and strength with aging. This can result in serious bone fractures. Your risk for osteoporosis can be identified using a bone density scan.  If you are 46 years of age or older, or if you are at risk for osteoporosis and fractures, ask your health care provider if you should be screened.  Ask your health care provider whether you should take a calcium or vitamin D supplement to lower your risk for osteoporosis.  Menopause may have certain physical symptoms and risks.  Hormone replacement therapy may reduce some of these symptoms and risks. Talk to your health care provider about whether hormone replacement therapy is right for you. Follow these instructions at home:  Schedule regular health, dental, and eye exams.  Stay current with your immunizations.  Do not use any tobacco products including cigarettes, chewing tobacco, or electronic cigarettes.  If you are  pregnant, do not drink alcohol.  If you are breastfeeding, limit how much and how often you drink alcohol.  Limit alcohol intake to no more than 1 drink per day for nonpregnant women. One drink equals 12 ounces of beer, 5 ounces of wine, or 1 ounces of hard liquor.  Do not use street drugs.  Do not share needles.  Ask your health care provider for help if you need support or information about quitting drugs.  Tell your health care provider if you often feel depressed.  Tell your health care provider if you have ever been abused or do not feel safe at home. This information is not intended to replace advice given to you by your health care provider. Make sure you discuss any questions you have with your health care provider. Document Released: 04/11/2011 Document Revised: 03/03/2016 Document Reviewed: 06/30/2015 Elsevier Interactive Patient Education  [employer].

## 2017-12-11 ENCOUNTER — Encounter: Payer: Self-pay | Admitting: Nurse Practitioner

## 2017-12-11 DIAGNOSIS — D649 Anemia, unspecified: Secondary | ICD-10-CM | POA: Insufficient documentation

## 2017-12-11 DIAGNOSIS — E119 Type 2 diabetes mellitus without complications: Secondary | ICD-10-CM | POA: Insufficient documentation

## 2018-05-18 ENCOUNTER — Other Ambulatory Visit: Payer: Self-pay | Admitting: Nurse Practitioner

## 2018-05-18 DIAGNOSIS — K2100 Gastro-esophageal reflux disease with esophagitis, without bleeding: Secondary | ICD-10-CM

## 2018-06-12 ENCOUNTER — Ambulatory Visit (INDEPENDENT_AMBULATORY_CARE_PROVIDER_SITE_OTHER): Payer: BLUE CROSS/BLUE SHIELD | Admitting: Nurse Practitioner

## 2018-06-12 ENCOUNTER — Encounter: Payer: Self-pay | Admitting: Nurse Practitioner

## 2018-06-12 VITALS — BP 136/68 | HR 64 | Temp 98.4°F | Ht 61.0 in | Wt 146.0 lb

## 2018-06-12 DIAGNOSIS — G8929 Other chronic pain: Secondary | ICD-10-CM

## 2018-06-12 DIAGNOSIS — M545 Low back pain: Secondary | ICD-10-CM

## 2018-06-12 DIAGNOSIS — J209 Acute bronchitis, unspecified: Secondary | ICD-10-CM | POA: Diagnosis not present

## 2018-06-12 DIAGNOSIS — K2100 Gastro-esophageal reflux disease with esophagitis, without bleeding: Secondary | ICD-10-CM

## 2018-06-12 DIAGNOSIS — E119 Type 2 diabetes mellitus without complications: Secondary | ICD-10-CM | POA: Diagnosis not present

## 2018-06-12 DIAGNOSIS — M5137 Other intervertebral disc degeneration, lumbosacral region: Secondary | ICD-10-CM

## 2018-06-12 DIAGNOSIS — J324 Chronic pansinusitis: Secondary | ICD-10-CM | POA: Diagnosis not present

## 2018-06-12 LAB — BASIC METABOLIC PANEL
BUN: 14 mg/dL (ref 6–23)
CALCIUM: 9.5 mg/dL (ref 8.4–10.5)
CO2: 26 mEq/L (ref 19–32)
Chloride: 104 mEq/L (ref 96–112)
Creatinine, Ser: 1.01 mg/dL (ref 0.40–1.20)
GFR: 71.37 mL/min (ref >60.00)
GLUCOSE: 95 mg/dL (ref 70–99)
Potassium: 4.9 mEq/L (ref 3.5–5.1)
SODIUM: 139 meq/L (ref 135–145)

## 2018-06-12 LAB — MICROALBUMIN / CREATININE URINE RATIO
Creatinine,U: 33.6 mg/dL
MICROALB/CREAT RATIO: 2.1 mg/g (ref 0.0–30.0)
Microalb, Ur: <0.7 mg/dL (ref 0.0–1.9)

## 2018-06-12 LAB — HEMOGLOBIN A1C: Hgb A1c MFr Bld: 6.4 % (ref 4.6–6.5)

## 2018-06-12 MED ORDER — ALBUTEROL SULFATE HFA 108 (90 BASE) MCG/ACT IN AERS: 1.0000 | INHALATION_SPRAY | Freq: Four times a day (QID) | RESPIRATORY_TRACT | 0 refills | Status: DC | PRN

## 2018-06-12 MED ORDER — BENZONATATE 100 MG PO CAPS: 100.0000 mg | ORAL_CAPSULE | Freq: Three times a day (TID) | ORAL | 0 refills | Status: DC | PRN

## 2018-06-12 MED ORDER — AZITHROMYCIN 250 MG PO TABS: 250.0000 mg | ORAL_TABLET | Freq: Every day | ORAL | 0 refills | Status: DC

## 2018-06-12 MED ORDER — TIZANIDINE HCL 4 MG PO CAPS: 4.0000 mg | ORAL_CAPSULE | Freq: Two times a day (BID) | ORAL | 3 refills | Status: DC | PRN

## 2018-06-12 MED ORDER — DM-GUAIFENESIN ER 30-600 MG PO TB12: 1.0000 | ORAL_TABLET | Freq: Two times a day (BID) | ORAL | 0 refills | Status: DC | PRN

## 2018-06-12 MED ORDER — OMEPRAZOLE 20 MG PO CPDR: DELAYED_RELEASE_CAPSULE | ORAL | 3 refills | Status: DC

## 2018-06-12 MED ORDER — FLUTICASONE PROPIONATE 50 MCG/ACT NA SUSP: NASAL | 3 refills | Status: DC

## 2018-06-12 MED ORDER — METHYLPREDNISOLONE ACETATE 40 MG/ML IJ SUSP
40.0000 mg | Freq: Once | INTRAMUSCULAR | Status: AC
  Administered 2018-06-12: 40 mg via INTRAMUSCULAR

## 2018-06-12 NOTE — Patient Instructions (Addendum)
Have ophthalmology report faxed to [place].  Schedule mammogram.  Go to lab for blood draw and urine collection.  Maintain adequate oral hydration. Continue flonase.   Diabetes Mellitus and Nutrition When you have diabetes (diabetes mellitus), it is very important to have healthy eating habits because your blood sugar (glucose) levels are greatly affected by what you eat and drink. Eating healthy foods in the appropriate amounts, at about the same times every day, can help you:  Control your blood glucose.  Lower your risk of heart disease.  Improve your blood pressure.  Reach or maintain a healthy weight.  Every person with diabetes is different, and each person has different needs for a meal plan. Your health care provider may recommend that you work with a diet and nutrition specialist (dietitian) to make a meal plan that is best for you. Your meal plan may vary depending on factors such as:  The calories you need.  The medicines you take.  Your weight.  Your blood glucose, blood pressure, and cholesterol levels.  Your activity level.  Other health conditions you have, such as heart or kidney disease.  How do carbohydrates affect me? Carbohydrates affect your blood glucose level more than any other type of food. Eating carbohydrates naturally increases the amount of glucose in your blood. Carbohydrate counting is a method for keeping track of how many carbohydrates you eat. Counting carbohydrates is important to keep your blood glucose at a healthy level, especially if you use insulin or take certain oral diabetes medicines. It is important to know how many carbohydrates you can safely have in each meal. This is different for every person. Your dietitian can help you calculate how many carbohydrates you should have at each meal and for snack. Foods that contain carbohydrates include:  Bread, cereal, rice, pasta, and crackers.  Potatoes and corn.  Peas, beans, and  lentils.  Milk and yogurt.  Fruit and juice.  Desserts, such as cakes, cookies, ice cream, and candy.  How does alcohol affect me? Alcohol can cause a sudden decrease in blood glucose (hypoglycemia), especially if you use insulin or take certain oral diabetes medicines. Hypoglycemia can be a life-threatening condition. Symptoms of hypoglycemia (sleepiness, dizziness, and confusion) are similar to symptoms of having too much alcohol. If your health care provider says that alcohol is safe for you, follow these guidelines:  Limit alcohol intake to no more than 1 drink per day for nonpregnant women and 2 drinks per day for men. One drink equals 12 oz of beer, 5 oz of wine, or 1 oz of hard liquor.  Do not drink on an empty stomach.  Keep yourself hydrated with water, diet soda, or unsweetened iced tea.  Keep in mind that regular soda, juice, and other mixers may contain a lot of sugar and must be counted as carbohydrates.  What are tips for following this plan? Reading food labels  Start by checking the serving size on the label. The amount of calories, carbohydrates, fats, and other nutrients listed on the label are based on one serving of the food. Many foods contain more than one serving per package.  Check the total grams (g) of carbohydrates in one serving. You can calculate the number of servings of carbohydrates in one serving by dividing the total carbohydrates by 15. For example, if a food has 30 g of total carbohydrates, it would be equal to 2 servings of carbohydrates.  Check the number of grams (g) of saturated and trans fats  in one serving. Choose foods that have low or no amount of these fats.  Check the number of milligrams (mg) of sodium in one serving. Most people should limit total sodium intake to less than 2,300 mg per day.  Always check the nutrition information of foods labeled as "low-fat" or "nonfat". These foods may be higher in added sugar or refined carbohydrates  and should be avoided.  Talk to your dietitian to identify your daily goals for nutrients listed on the label. Shopping  Avoid buying canned, premade, or processed foods. These foods tend to be high in fat, sodium, and added sugar.  Shop around the outside edge of the grocery store. This includes fresh fruits and vegetables, bulk grains, fresh meats, and fresh dairy. Cooking  Use low-heat cooking methods, such as baking, instead of high-heat cooking methods like deep frying.  Cook using healthy oils, such as olive, canola, or sunflower oil.  Avoid cooking with butter, cream, or high-fat meats. Meal planning  Eat meals and snacks regularly, preferably at the same times every day. Avoid going long periods of time without eating.  Eat foods high in fiber, such as fresh fruits, vegetables, beans, and whole grains. Talk to your dietitian about how many servings of carbohydrates you can eat at each meal.  Eat 4-6 ounces of lean protein each day, such as lean meat, chicken, fish, eggs, or tofu. 1 ounce is equal to 1 ounce of meat, chicken, or fish, 1 egg, or 1/4 cup of tofu.  Eat some foods each day that contain healthy fats, such as avocado, nuts, seeds, and fish. Lifestyle   Check your blood glucose regularly.  Exercise at least 30 minutes 5 or more days each week, or as told by your health care provider.  Take medicines as told by your health care provider.  Do not use any products that contain nicotine or tobacco, such as cigarettes and e-cigarettes. If you need help quitting, ask your health care provider.  Work with a Veterinary surgeon or diabetes educator to identify strategies to manage stress and any emotional and social challenges. What are some questions to ask my health care provider?  Do I need to meet with a diabetes educator?  Do I need to meet with a dietitian?  What number can I call if I have questions?  When are the best times to check my blood glucose? Where to find  more information:  American Diabetes Association: diabetes.org/food-and-fitness/food  Academy of Nutrition and Dietetics: https://www.vargas.com/  [employer] of Diabetes and Digestive and Kidney Diseases (NIH): FindJewelers.cz Summary  A healthy meal plan will help you control your blood glucose and maintain a healthy lifestyle.  Working with a diet and nutrition specialist (dietitian) can help you make a meal plan that is best for you.  Keep in mind that carbohydrates and alcohol have immediate effects on your blood glucose levels. It is important to count carbohydrates and to use alcohol carefully. This information is not intended to replace advice given to you by your health care provider. Make sure you discuss any questions you have with your health care provider. Document Released: 06/23/2005 Document Revised: 10/31/2016 Document Reviewed: 10/31/2016 Elsevier Interactive Patient Education  [employer].

## 2018-06-12 NOTE — Progress Notes (Signed)
Subjective:  Patient ID: Sandra Curtis, female    DOB: May 14, 1956  Age: 62 y.o. MRN: [id number]  CC: Follow-up (6 mo f/u on DM (repeat HgbA1c). heart burn at times,so patient is taking 2 at times.refill request? ) and Cough (couging yellow mucus,nose congestions,headache,sinus,wheezing. this has been going on for 1 mo.)  Cough  This is a new problem. The current episode started 1 to 4 weeks ago. The problem has been waxing and waning. The problem occurs constantly. The cough is productive of purulent sputum. Associated symptoms include headaches, heartburn, nasal congestion, postnasal drip, rhinorrhea, shortness of breath and wheezing. The symptoms are aggravated by lying down. She has tried OTC cough suppressant for the symptoms. The treatment provided mild relief. Her past medical history is significant for environmental allergies.   DM: diet controlled, last Hgb A1c 6.5, eye exam completed, dental exam done every 6months,   Reviewed past Medical, Social and Family history today.  Outpatient Medications Prior to Visit  Medication Sig Dispense Refill  . acetaminophen (TYLENOL) 500 MG tablet Take 2 tablets (1,000 mg total) by mouth every 8 (eight) hours as needed for moderate pain or headache. 30 tablet 0  . diphenhydrAMINE (BENADRYL ALLERGY) 25 MG tablet Take 1-2tabs at bedtime prn for sleep 30 tablet 0  . Multiple Vitamin (MULTIVITAMIN) tablet Take 1 tablet by mouth daily.    . polycarbophil (FIBERCON) 625 MG tablet Take 625 mg by mouth daily.    . Probiotic Product (PHILLIPS COLON HEALTH) CAPS Take 1 capsule by mouth daily.    . famotidine (PEPCID) 20 MG tablet Take 1 tablet (20 mg total) by mouth at bedtime. 90 tablet 3  . fluticasone (FLONASE) 50 MCG/ACT nasal spray SHAKE LIQUID AND USE 2 SPRAYS IN EACH NOSTRIL DAILY 16 g 3  . omeprazole (PRILOSEC) 20 MG capsule TAKE 1 CAPSULE BY MOUTH EVERY DAY 90 capsule 0  . tiZANidine (ZANAFLEX) 4 MG capsule Take 1 capsule (4 mg total) by  mouth 2 (two) times daily as needed for muscle spasms. 60 capsule 3  . Biotin 5000 MCG CAPS Take by mouth.    Marland Kitchen aspirin-acetaminophen-caffeine (EXCEDRIN MIGRAINE) 250-250-65 MG tablet Take 1 tablet by mouth every 8 (eight) hours as needed for headache. With food (Patient not taking: Reported on 06/12/2018) 30 tablet 0  . Diclofenac Sodium (PENNSAID) 2 % SOLN Place 1 application onto the skin 2 (two) times daily. (Patient not taking: Reported on 06/12/2018) 112 g 1  . guaiFENesin (MUCINEX) 600 MG 12 hr tablet Take 1 tablet (600 mg total) by mouth 2 (two) times daily as needed for cough or to loosen phlegm. (Patient not taking: Reported on 06/12/2018) 14 tablet 0   No facility-administered medications prior to visit.     ROS See HPI  Objective:  BP 136/68   Pulse 64   Temp 98.4 F (36.9 C) (Oral)   Ht 5\' 1"  (1.549 m)   Wt 146 lb (66.2 kg)   SpO2 100%   BMI 27.59 kg/m   BP Readings from Last 3 Encounters:  06/12/18 136/68  12/08/17 130/66  12/07/17 124/78    Wt Readings from Last 3 Encounters:  06/12/18 146 lb (66.2 kg)  12/08/17 149 lb (67.6 kg)  12/07/17 143 lb (64.9 kg)    Physical Exam  Constitutional: No distress.  HENT:  Right Ear: Ear canal normal. No mastoid tenderness. Tympanic membrane is not injected. A middle ear effusion is present.  Left Ear: Ear canal normal. No mastoid tenderness. Tympanic  membrane is not injected. A middle ear effusion is present.  Nose: Mucosal edema and rhinorrhea present. Right sinus exhibits maxillary sinus tenderness and frontal sinus tenderness. Left sinus exhibits maxillary sinus tenderness and frontal sinus tenderness.  Mouth/Throat: Uvula is midline. No trismus in the jaw. Posterior oropharyngeal erythema present. No oropharyngeal exudate or posterior oropharyngeal edema.  Cardiovascular: Normal rate and regular rhythm.  Pulmonary/Chest: Effort normal. No stridor. No respiratory distress. She has wheezes. She has no rales.    Lymphadenopathy:    She has cervical adenopathy.  Skin: No rash noted.  Vitals reviewed.   Lab Results  Component Value Date   WBC 6.1 12/08/2017   HGB 11.0 (L) 12/08/2017   HCT 32.9 (L) 12/08/2017   PLT 240.0 12/08/2017   GLUCOSE 92 12/08/2017   CHOL 127 12/08/2017   TRIG 74.0 12/08/2017   HDL 41.60 12/08/2017   LDLCALC 71 12/08/2017   ALT 26 12/08/2017   AST 32 12/08/2017   NA 143 12/08/2017   K 3.3 (L) 12/08/2017   CL 102 12/08/2017   CREATININE 0.77 12/08/2017   BUN 8 12/08/2017   CO2 33 (H) 12/08/2017   TSH 0.88 02/06/2017   HGBA1C 6.5 12/08/2017    Dg Abd 1 View  Result Date: 12/07/2017 CLINICAL DATA:  Abdominal distention, nausea, constipation and diarrhea. EXAM: ABDOMEN - 1 VIEW COMPARISON:  02/06/2017 lumbar spine radiographs FINDINGS: The bowel gas pattern is normal. No radio-opaque calculi are noted. No acute bony abnormalities are present. Mild lumbar scoliosis and degenerative changes again noted. IMPRESSION: Unremarkable bowel gas pattern.  No significant abnormalities. Electronically Signed   By: Harmon Pier M.D.   On: 12/07/2017 15:58    Assessment & Plan:   Latania was seen today for follow-up and cough.  Diagnoses and all orders for this visit:  Type 2 diabetes mellitus without complication, without long-term current use of insulin (HCC) -     Hemoglobin A1c -     Basic metabolic panel -     Urine Microalbumin w/creat. ratio  DDD (degenerative disc disease), lumbosacral -     tiZANidine (ZANAFLEX) 4 MG capsule; Take 1 capsule (4 mg total) by mouth 2 (two) times daily as needed for muscle spasms.  Chronic midline low back pain without sciatica -     tiZANidine (ZANAFLEX) 4 MG capsule; Take 1 capsule (4 mg total) by mouth 2 (two) times daily as needed for muscle spasms.  Gastroesophageal reflux disease with esophagitis -     omeprazole (PRILOSEC) 20 MG capsule; TAKE 1 CAPSULE BY MOUTH EVERY DAY  Chronic pansinusitis -     fluticasone  (FLONASE) 50 MCG/ACT nasal spray; SHAKE LIQUID AND USE 2 SPRAYS IN EACH NOSTRIL DAILY -     methylPREDNISolone acetate (DEPO-MEDROL) injection 40 mg  Acute bronchitis, unspecified organism -     methylPREDNISolone acetate (DEPO-MEDROL) injection 40 mg -     benzonatate (TESSALON) 100 MG capsule; Take 1 capsule (100 mg total) by mouth 3 (three) times daily as needed for cough. -     dextromethorphan-guaiFENesin (MUCINEX DM) 30-600 MG 12hr tablet; Take 1 tablet by mouth 2 (two) times daily as needed for cough. -     azithromycin (ZITHROMAX Z-PAK) 250 MG tablet; Take 1 tablet (250 mg total) by mouth daily. Take 2tabs on first day, then 1tab once a day till complete -     albuterol (PROVENTIL HFA;VENTOLIN HFA) 108 (90 Base) MCG/ACT inhaler; Inhale 1-2 puffs into the lungs every 6 (six) hours as  needed.   I have discontinued [employer]'s Diclofenac Sodium, guaiFENesin, aspirin-acetaminophen-caffeine, and famotidine. I am also having her start on benzonatate, dextromethorphan-guaiFENesin, azithromycin, and albuterol. Additionally, I am having her maintain her PHILLIPS COLON HEALTH, polycarbophil, multivitamin, diphenhydrAMINE, acetaminophen, Biotin, tiZANidine, omeprazole, and fluticasone. We administered methylPREDNISolone acetate.  Meds ordered this encounter  Medications  . tiZANidine (ZANAFLEX) 4 MG capsule    Sig: Take 1 capsule (4 mg total) by mouth 2 (two) times daily as needed for muscle spasms.    Dispense:  60 capsule    Refill:  3    Order Specific Question:   Supervising Provider    Answer:   Dianne Dun [[id number]]  . omeprazole (PRILOSEC) 20 MG capsule    Sig: TAKE 1 CAPSULE BY MOUTH EVERY DAY    Dispense:  90 capsule    Refill:  3    Order Specific Question:   Supervising Provider    Answer:   Dianne Dun [[id number]]  . fluticasone (FLONASE) 50 MCG/ACT nasal spray    Sig: SHAKE LIQUID AND USE 2 SPRAYS IN EACH NOSTRIL DAILY    Dispense:  16 g    Refill:  3    Order Specific  Question:   Supervising Provider    Answer:   Dianne Dun [[id number]]  . methylPREDNISolone acetate (DEPO-MEDROL) injection 40 mg  . benzonatate (TESSALON) 100 MG capsule    Sig: Take 1 capsule (100 mg total) by mouth 3 (three) times daily as needed for cough.    Dispense:  20 capsule    Refill:  0    Order Specific Question:   Supervising Provider    Answer:   Dianne Dun [[id number]]  . dextromethorphan-guaiFENesin (MUCINEX DM) 30-600 MG 12hr tablet    Sig: Take 1 tablet by mouth 2 (two) times daily as needed for cough.    Dispense:  14 tablet    Refill:  0    Order Specific Question:   Supervising Provider    Answer:   Dianne Dun [[id number]]  . azithromycin (ZITHROMAX Z-PAK) 250 MG tablet    Sig: Take 1 tablet (250 mg total) by mouth daily. Take 2tabs on first day, then 1tab once a day till complete    Dispense:  6 tablet    Refill:  0    Order Specific Question:   Supervising Provider    Answer:   Dianne Dun [[id number]]  . albuterol (PROVENTIL HFA;VENTOLIN HFA) 108 (90 Base) MCG/ACT inhaler    Sig: Inhale 1-2 puffs into the lungs every 6 (six) hours as needed.    Dispense:  1 Inhaler    Refill:  0    Order Specific Question:   Supervising Provider    Answer:   Dianne Dun [[id number]]    Follow-up: Return in about 6 months (around 12/11/2018) for DM.  Alysia Penna, NP

## 2018-06-19 LAB — HM PAP SMEAR: HM Pap smear: NEGATIVE

## 2018-07-10 ENCOUNTER — Other Ambulatory Visit: Payer: Self-pay | Admitting: Nurse Practitioner

## 2018-07-10 DIAGNOSIS — J209 Acute bronchitis, unspecified: Secondary | ICD-10-CM

## 2018-07-11 ENCOUNTER — Ambulatory Visit (INDEPENDENT_AMBULATORY_CARE_PROVIDER_SITE_OTHER): Payer: BLUE CROSS/BLUE SHIELD | Admitting: Nurse Practitioner

## 2018-07-11 ENCOUNTER — Encounter: Payer: Self-pay | Admitting: Nurse Practitioner

## 2018-07-11 VITALS — BP 130/70 | HR 76 | Temp 98.2°F | Ht 61.0 in | Wt 145.0 lb

## 2018-07-11 DIAGNOSIS — M545 Low back pain, unspecified: Secondary | ICD-10-CM

## 2018-07-11 DIAGNOSIS — M5137 Other intervertebral disc degeneration, lumbosacral region: Secondary | ICD-10-CM | POA: Diagnosis not present

## 2018-07-11 MED ORDER — KETOROLAC TROMETHAMINE 30 MG/ML IJ SOLN
30.0000 mg | Freq: Once | INTRAMUSCULAR | Status: AC
  Administered 2018-07-11: 30 mg via INTRAMUSCULAR

## 2018-07-11 MED ORDER — PREDNISONE 20 MG PO TABS: 40.0000 mg | ORAL_TABLET | Freq: Every day | ORAL | 0 refills | Status: AC

## 2018-07-11 MED ORDER — TRAMADOL HCL 50 MG PO TABS: 50.0000 mg | ORAL_TABLET | Freq: Three times a day (TID) | ORAL | 0 refills | Status: AC | PRN

## 2018-07-11 MED ORDER — CYCLOBENZAPRINE HCL 5 MG PO TABS: 5.0000 mg | ORAL_TABLET | Freq: Every day | ORAL | 1 refills | Status: DC

## 2018-07-11 NOTE — Patient Instructions (Addendum)
Hold zanaflex while taking flexeril  Use cold compress 2-3times a day (use for at a time).  Return to office if no improvement in 2days.  No ibuprofen and naproxen while taking oral prednisone  Back Pain, Adult Many adults have back pain from time to time. Common causes of back pain include:  A strained muscle or ligament.  Wear and tear (degeneration) of the spinal disks.  Arthritis.  A hit to the back.  Back pain can be short-lived (acute) or last a long time (chronic). A physical exam, lab tests, and imaging studies may be done to find the cause of your pain. Follow these instructions at home: Managing pain and stiffness  Take over-the-counter and prescription medicines only as told by your health care provider.  If directed, apply heat to the affected area as often as told by your health care provider. Use the heat source that your health care provider recommends, such as a moist heat pack or a heating pad. ? Place a towel between your skin and the heat source. ? Leave the heat on for 20-30 minutes. ? Remove the heat if your skin turns bright red. This is especially important if you are unable to feel pain, heat, or cold. You have a greater risk of getting burned.  If directed, apply ice to the injured area: ? Put ice in a plastic bag. ? Place a towel between your skin and the bag. ? Leave the ice on for 20 minutes, 2-3 times a day for the first 2-3 days. Activity  Do not stay in bed. Resting more than 1-2 days can delay your recovery.  Take short walks on even surfaces as soon as you are able. Try to increase the length of time you walk each day.  Do not sit, drive, or stand in one place for more than 30 minutes at a time. Sitting or standing for long periods of time can put stress on your back.  Use proper lifting techniques. When you bend and lift, use positions that put less stress on your back: ? [place] your knees. ? Keep the load close to your body. ? Avoid  twisting.  Exercise regularly as told by your health care provider. Exercising will help your back heal faster. This also helps prevent back injuries by keeping muscles strong and flexible.  Your health care provider may recommend that you see a physical therapist. This person can help you come up with a safe exercise program. Do any exercises as told by your physical therapist. Lifestyle  Maintain a healthy weight. Extra weight puts stress on your back and makes it difficult to have good posture.  Avoid activities or situations that make you feel anxious or stressed. Learn ways to manage anxiety and stress. One way to manage stress is through exercise. Stress and anxiety increase muscle tension and can make back pain worse. General instructions  Sleep on a firm mattress in a comfortable position. Try lying on your side with your knees slightly bent. If you lie on your back, put a pillow under your knees.  Follow your treatment plan as told by your health care provider. This may include: ? Cognitive or behavioral therapy. ? Acupuncture or massage therapy. ? Meditation or yoga. Contact a health care provider if:  You have pain that is not relieved with rest or medicine.  You have increasing pain going down into your legs or buttocks.  Your pain does not improve in 2 weeks.  You have pain  at night.  You lose weight.  You have a fever or chills. Get help right away if:  You develop new bowel or bladder control problems.  You have unusual weakness or numbness in your arms or legs.  You develop nausea or vomiting.  You develop abdominal pain.  You feel faint. Summary  Many adults have back pain from time to time. A physical exam, lab tests, and imaging studies may be done to find the cause of your pain.  Use proper lifting techniques. When you bend and lift, use positions that put less stress on your back.  Take over-the-counter and prescription medicines and apply heat or  ice as directed by your health care provider. This information is not intended to replace advice given to you by your health care provider. Make sure you discuss any questions you have with your health care provider. Document Released: 09/26/2005 Document Revised: 10/31/2016 Document Reviewed: 10/31/2016 Elsevier Interactive Patient Education  [employer].

## 2018-07-11 NOTE — Progress Notes (Signed)
Subjective:  Patient ID: Sandra Curtis, female    DOB: [birth year]  Age: 62 y.o. MRN: [id number]  CC: Back Pain (patient is complaining of lower ack pain,going on 2 wks, took aleve and ice pack. denied any injury. )  Back Pain  This is a chronic problem. The current episode started 1 to 4 weeks ago. The problem occurs intermittently. The problem has been waxing and waning since onset. The pain is present in the lumbar spine. The quality of the pain is described as aching and cramping. The pain does not radiate. The pain is at a severity of 10/10. The pain is severe. The pain is the same all the time. The symptoms are aggravated by bending, lying down and twisting. Pertinent negatives include no bladder incontinence, bowel incontinence, chest pain, dysuria, fever, leg pain, numbness, paresis, paresthesias, pelvic pain, perianal numbness, tingling, weakness or weight loss. Risk factors include poor posture and sedentary lifestyle. She has tried heat, ice, muscle relaxant, NSAIDs and bed rest for the symptoms. The treatment provided no relief.   Reviewed past Medical, Social and Family history today.  Outpatient Medications Prior to Visit  Medication Sig Dispense Refill  . acetaminophen (TYLENOL) 500 MG tablet Take 2 tablets (1,000 mg total) by mouth every 8 (eight) hours as needed for moderate pain or headache. 30 tablet 0  . albuterol (PROVENTIL HFA;VENTOLIN HFA) 108 (90 Base) MCG/ACT inhaler Inhale 1-2 puffs into the lungs every 6 (six) hours as needed. 1 Inhaler 0  . Biotin 5000 MCG CAPS Take by mouth.    . fluticasone (FLONASE) 50 MCG/ACT nasal spray SHAKE LIQUID AND USE 2 SPRAYS IN EACH NOSTRIL DAILY 16 g 3  . Multiple Vitamin (MULTIVITAMIN) tablet Take 1 tablet by mouth daily.    Marland Kitchen omeprazole (PRILOSEC) 20 MG capsule TAKE 1 CAPSULE BY MOUTH EVERY DAY 90 capsule 3  . polycarbophil (FIBERCON) 625 MG tablet Take 625 mg by mouth daily.    . Probiotic Product (PHILLIPS COLON HEALTH) CAPS  Take 1 capsule by mouth daily.    Marland Kitchen tiZANidine (ZANAFLEX) 4 MG capsule Take 1 capsule (4 mg total) by mouth 2 (two) times daily as needed for muscle spasms. 60 capsule 3  . diphenhydrAMINE (BENADRYL ALLERGY) 25 MG tablet Take 1-2tabs at bedtime prn for sleep 30 tablet 0  . azithromycin (ZITHROMAX Z-PAK) 250 MG tablet Take 1 tablet (250 mg total) by mouth daily. Take 2tabs on first day, then 1tab once a day till complete (Patient not taking: Reported on 07/11/2018) 6 tablet 0  . benzonatate (TESSALON) 100 MG capsule Take 1 capsule (100 mg total) by mouth 3 (three) times daily as needed for cough. (Patient not taking: Reported on 07/11/2018) 20 capsule 0  . dextromethorphan-guaiFENesin (MUCINEX DM) 30-600 MG 12hr tablet Take 1 tablet by mouth 2 (two) times daily as needed for cough. (Patient not taking: Reported on 07/11/2018) 14 tablet 0   No facility-administered medications prior to visit.     ROS See HPI  Objective:  BP 130/70   Pulse 76   Temp 98.2 F (36.8 C) (Oral)   Ht 5\' 1"  (1.549 m)   Wt 145 lb (65.8 kg)   SpO2 98%   BMI 27.40 kg/m   BP Readings from Last 3 Encounters:  07/11/18 130/70  06/12/18 136/68  12/08/17 130/66    Wt Readings from Last 3 Encounters:  07/11/18 145 lb (65.8 kg)  06/12/18 146 lb (66.2 kg)  12/08/17 149 lb (67.6 kg)  Physical Exam  Constitutional: She is oriented to person, place, and time. She appears well-developed and well-nourished.  Cardiovascular: Normal rate.  Pulmonary/Chest: Effort normal.  Abdominal: Soft. Bowel sounds are normal. She exhibits no distension. There is no tenderness.  Musculoskeletal: She exhibits no edema.       Right hip: Normal.       Left hip: Normal.       Left knee: Normal.       Thoracic back: Normal.       Lumbar back: She exhibits tenderness. She exhibits no bony tenderness.       Back:       Right upper leg: Normal.       Left upper leg: Normal.  Neurological: She is alert and oriented to person, place,  and time.  Vitals reviewed.   Lab Results  Component Value Date   WBC 6.1 12/08/2017   HGB 11.0 (L) 12/08/2017   HCT 32.9 (L) 12/08/2017   PLT 240.0 12/08/2017   GLUCOSE 95 06/12/2018   CHOL 127 12/08/2017   TRIG 74.0 12/08/2017   HDL 41.60 12/08/2017   LDLCALC 71 12/08/2017   ALT 26 12/08/2017   AST 32 12/08/2017   NA 139 06/12/2018   K 4.9 06/12/2018   CL 104 06/12/2018   CREATININE 1.01 06/12/2018   BUN 14 06/12/2018   CO2 26 06/12/2018   TSH 0.88 02/06/2017   HGBA1C 6.4 06/12/2018   MICROALBUR <0.7 06/12/2018    Dg Abd 1 View  Result Date: 12/07/2017 CLINICAL DATA:  Abdominal distention, nausea, constipation and diarrhea. EXAM: ABDOMEN - 1 VIEW COMPARISON:  02/06/2017 lumbar spine radiographs FINDINGS: The bowel gas pattern is normal. No radio-opaque calculi are noted. No acute bony abnormalities are present. Mild lumbar scoliosis and degenerative changes again noted. IMPRESSION: Unremarkable bowel gas pattern.  No significant abnormalities. Electronically Signed   By: Harmon Pier M.D.   On: 12/07/2017 15:58    Assessment & Plan:   Sandra Curtis was seen today for back pain.  Diagnoses and all orders for this visit:  Acute left-sided low back pain without sciatica -     ketorolac (TORADOL) 30 MG/ML injection 30 mg -     traMADol (ULTRAM) 50 MG tablet; Take 1 tablet (50 mg total) by mouth every 8 (eight) hours as needed for up to 3 days. -     cyclobenzaprine (FLEXERIL) 5 MG tablet; Take 1 tablet (5 mg total) by mouth at bedtime. -     predniSONE (DELTASONE) 20 MG tablet; Take 2 tablets (40 mg total) by mouth daily with breakfast for 3 days.  DDD (degenerative disc disease), lumbosacral -     ketorolac (TORADOL) 30 MG/ML injection 30 mg -     traMADol (ULTRAM) 50 MG tablet; Take 1 tablet (50 mg total) by mouth every 8 (eight) hours as needed for up to 3 days. -     cyclobenzaprine (FLEXERIL) 5 MG tablet; Take 1 tablet (5 mg total) by mouth at bedtime. -     predniSONE  (DELTASONE) 20 MG tablet; Take 2 tablets (40 mg total) by mouth daily with breakfast for 3 days.   I have discontinued Sandra Curtis's diphenhydrAMINE, benzonatate, dextromethorphan-guaiFENesin, and azithromycin. I am also having her start on traMADol, cyclobenzaprine, and predniSONE. Additionally, I am having her maintain her PHILLIPS COLON HEALTH, polycarbophil, multivitamin, acetaminophen, Biotin, tiZANidine, omeprazole, fluticasone, and albuterol. We will continue to administer ketorolac.  Meds ordered this encounter  Medications  . ketorolac (TORADOL) 30  MG/ML injection 30 mg  . traMADol (ULTRAM) 50 MG tablet    Sig: Take 1 tablet (50 mg total) by mouth every 8 (eight) hours as needed for up to 3 days.    Dispense:  9 tablet    Refill:  0    Order Specific Question:   Supervising Provider    Answer:   Dianne Dun [[id number]]  . cyclobenzaprine (FLEXERIL) 5 MG tablet    Sig: Take 1 tablet (5 mg total) by mouth at bedtime.    Dispense:  5 tablet    Refill:  1    Order Specific Question:   Supervising Provider    Answer:   Dianne Dun [[id number]]  . predniSONE (DELTASONE) 20 MG tablet    Sig: Take 2 tablets (40 mg total) by mouth daily with breakfast for 3 days.    Dispense:  6 tablet    Refill:  0    Order Specific Question:   Supervising Provider    Answer:   Dianne Dun [[id number]]    Follow-up: Return if symptoms worsen or fail to improve.  Alysia Penna, NP

## 2018-07-12 ENCOUNTER — Encounter: Payer: Self-pay | Admitting: Nurse Practitioner

## 2018-07-15 ENCOUNTER — Other Ambulatory Visit: Payer: Self-pay | Admitting: Nurse Practitioner

## 2018-07-15 DIAGNOSIS — M545 Low back pain, unspecified: Secondary | ICD-10-CM

## 2018-07-15 DIAGNOSIS — M5137 Other intervertebral disc degeneration, lumbosacral region: Secondary | ICD-10-CM

## 2018-07-18 ENCOUNTER — Other Ambulatory Visit: Payer: Self-pay | Admitting: Nurse Practitioner

## 2018-07-18 ENCOUNTER — Telehealth: Payer: Self-pay | Admitting: Nurse Practitioner

## 2018-07-18 DIAGNOSIS — M545 Low back pain, unspecified: Secondary | ICD-10-CM

## 2018-07-18 DIAGNOSIS — M5137 Other intervertebral disc degeneration, lumbosacral region: Secondary | ICD-10-CM

## 2018-07-18 MED ORDER — NABUMETONE 500 MG PO TABS: 500.0000 mg | ORAL_TABLET | Freq: Every day | ORAL | 0 refills | Status: DC

## 2018-07-18 MED ORDER — CYCLOBENZAPRINE HCL 10 MG PO TABS: 10.0000 mg | ORAL_TABLET | Freq: Every day | ORAL | 0 refills | Status: DC

## 2018-07-18 NOTE — Telephone Encounter (Signed)
Pt is aware.  

## 2018-07-18 NOTE — Telephone Encounter (Signed)
Appt made 07/20/18 at 1pm.    Copied from CRM #[id number]. Topic: General - Other >> Jul 18, 2018  9:42 AM Tamela Oddi wrote: Reason for CRM: Patient called to request a refill for Tramadol.  Please advise and call patient back at [phone]

## 2018-07-19 ENCOUNTER — Ambulatory Visit (INDEPENDENT_AMBULATORY_CARE_PROVIDER_SITE_OTHER): Payer: BLUE CROSS/BLUE SHIELD

## 2018-07-19 DIAGNOSIS — M5137 Other intervertebral disc degeneration, lumbosacral region: Secondary | ICD-10-CM

## 2018-07-19 DIAGNOSIS — M545 Low back pain, unspecified: Secondary | ICD-10-CM

## 2018-07-19 DIAGNOSIS — S32010A Wedge compression fracture of first lumbar vertebra, initial encounter for closed fracture: Secondary | ICD-10-CM

## 2018-07-20 ENCOUNTER — Ambulatory Visit: Payer: BLUE CROSS/BLUE SHIELD | Admitting: Nurse Practitioner

## 2018-07-20 ENCOUNTER — Encounter: Payer: Self-pay | Admitting: Nurse Practitioner

## 2018-07-20 DIAGNOSIS — G8929 Other chronic pain: Secondary | ICD-10-CM

## 2018-07-20 DIAGNOSIS — M549 Dorsalgia, unspecified: Secondary | ICD-10-CM

## 2018-07-20 HISTORY — DX: Dorsalgia, unspecified: M54.9

## 2018-07-20 HISTORY — DX: Other chronic pain: G89.29

## 2018-07-20 MED ORDER — TRAMADOL HCL 50 MG PO TABS: 50.0000 mg | ORAL_TABLET | Freq: Three times a day (TID) | ORAL | 0 refills | Status: AC | PRN

## 2018-07-20 NOTE — Progress Notes (Unsigned)
See results note. 

## 2018-08-08 ENCOUNTER — Ambulatory Visit (INDEPENDENT_AMBULATORY_CARE_PROVIDER_SITE_OTHER): Payer: BLUE CROSS/BLUE SHIELD | Admitting: Orthopaedic Surgery

## 2018-08-08 ENCOUNTER — Encounter (INDEPENDENT_AMBULATORY_CARE_PROVIDER_SITE_OTHER): Payer: Self-pay | Admitting: Orthopaedic Surgery

## 2018-08-08 ENCOUNTER — Ambulatory Visit (INDEPENDENT_AMBULATORY_CARE_PROVIDER_SITE_OTHER): Payer: Self-pay

## 2018-08-08 VITALS — BP 151/77 | HR 77 | Ht 61.0 in | Wt 145.0 lb

## 2018-08-08 DIAGNOSIS — M545 Low back pain, unspecified: Secondary | ICD-10-CM

## 2018-08-08 DIAGNOSIS — G8929 Other chronic pain: Secondary | ICD-10-CM | POA: Diagnosis not present

## 2018-08-12 ENCOUNTER — Encounter (INDEPENDENT_AMBULATORY_CARE_PROVIDER_SITE_OTHER): Payer: Self-pay | Admitting: Orthopaedic Surgery

## 2018-08-12 NOTE — Progress Notes (Signed)
Office Visit Note   Patient: Sandra Curtis           Date of Birth: [birth year]           MRN: [id number] Visit Date: 08/08/2018              Requested by: [name], NP [address] [place], [place] [address] PCP: [name], NP   Assessment & Plan: Visit Diagnoses:  1. Chronic midline low back pain, unspecified whether sciatica present     Plan: X-rays were reviewed with patient which shows to superior L1 compression.  Will obtain an MRI scan of her back to evaluate L1 better without history of specific fracture to make sure there is not any lesions that would be a concern.  Changes in L1 are new from April 2018 previous lumbar x-rays.  Follow-Up Instructions: No follow-ups on file.   Orders:  Orders Placed This Encounter  Procedures  . XR Lumbar Spine 2-3 Views  . MR Lumbar Spine w/o contrast   No orders of the defined types were placed in this encounter.     Procedures: No procedures performed   Clinical Data: No additional findings.   Subjective: Chief Complaint  Patient presents with  . Lower Back - Fracture    HPI 62 year old female seen with many month history of back pain.  She states she has difficulty getting up after bending over from tying her shoes she stands at work and has to lift boxes there is concern possibly her pain may be work-related she denies any history of falls.  She states she has to take breaks when she is working due to mid back pain in the midline.  Review of Systems positive for scoliosis disc degeneration GI reflux constipation, diarrhea type 2 diabetes not on insulin.  14 point review of systems otherwise negative is a pertains HPI.   Objective: Vital Signs: BP (!) 151/77   Pulse 77   Ht 5\' 1"  (1.549 m)   Wt 145 lb (65.8 kg)   BMI 27.40 kg/m   Physical Exam  Constitutional: She is oriented to person, place, and time. She appears well-developed.  HENT:  Head: Normocephalic.  Right Ear:  External ear normal.  Left Ear: External ear normal.  Eyes: Pupils are equal, round, and reactive to light.  Neck: No tracheal deviation present. No thyromegaly present.  Cardiovascular: Normal rate.  Pulmonary/Chest: Effort normal.  Abdominal: Soft.  Neurological: She is alert and oriented to person, place, and time.  Skin: Skin is warm and dry.  Psychiatric: She has a normal mood and affect. Her behavior is normal.    Ortho Exam patient has intact knee flexion quad strength normal hip range of motion knee and ankle jerk are intact anterior tib gastrocsoleus is normal.  Station is intact.  Increased pain with forward flexion at the hips and lumbar spine as well as extension.  Specialty Comments:  No specialty comments available.  Imaging: No results found.   PMFS History: Patient Active Problem List   Diagnosis Date Noted  . Type 2 diabetes mellitus without complication, without long-term current use of insulin (HCC) 12/11/2017  . Anemia 12/11/2017  . Diarrhea 12/07/2017  . Insomnia 02/06/2017  . Gastroesophageal reflux disease with esophagitis 02/06/2017  . DDD (degenerative disc disease), lumbosacral 02/06/2017  . Constipation 02/06/2017  . Dysphagia 02/06/2017  . Scoliosis of lumbar spine 02/06/2017  . Chronic midline low back pain without sciatica 02/06/2017  . Systolic murmur  10/02/2013  . Routine general medical examination at a health care facility 07/11/2012  . Headache 03/01/2012  . Seasonal allergic rhinitis 03/01/2012   Past Medical History:  Diagnosis Date  . Allergic rhinitis   . Allergy   . Depression   . Esophageal stricture   . GERD (gastroesophageal reflux disease)   . Inflammatory polyps of colon (HCC) 2010  . Systolic murmur   . UTI (urinary tract infection)     Family History  Problem Relation Age of Onset  . Arthritis Mother   . Thyroid disease Mother   . Heart disease Father   . Hypertension Father   . Diabetes Father   . Arthritis  Sister   . Colon cancer Neg Hx   . Stomach cancer Neg Hx     No past surgical history on file. Social History   Occupational History  . Occupation: Biomedical engineer: paperworks  Tobacco Use  . Smoking status: Former Smoker    Packs/day: 0.50    Years: 35.00    Pack years: 17.50    Last attempt to quit: 06/10/2013    Years since quitting: 5.1  . Smokeless tobacco: Never Used  Substance and Sexual Activity  . Alcohol use: Yes    Comment: occasionally  . Drug use: No  . Sexual activity: Not on file

## 2018-08-18 ENCOUNTER — Ambulatory Visit
Admission: RE | Admit: 2018-08-18 | Discharge: 2018-08-18 | Disposition: A | Discharge status: Home / Self Care | Payer: BLUE CROSS/BLUE SHIELD | Source: Ambulatory Visit | Attending: Orthopaedic Surgery | Admitting: Orthopaedic Surgery

## 2018-08-18 DIAGNOSIS — G8929 Other chronic pain: Secondary | ICD-10-CM

## 2018-08-18 DIAGNOSIS — M545 Low back pain, unspecified: Secondary | ICD-10-CM

## 2018-08-28 ENCOUNTER — Ambulatory Visit (INDEPENDENT_AMBULATORY_CARE_PROVIDER_SITE_OTHER): Payer: BLUE CROSS/BLUE SHIELD | Admitting: Orthopaedic Surgery

## 2018-08-28 ENCOUNTER — Encounter (INDEPENDENT_AMBULATORY_CARE_PROVIDER_SITE_OTHER): Payer: Self-pay | Admitting: Orthopaedic Surgery

## 2018-08-28 VITALS — BP 150/80 | HR 87 | Ht 61.0 in | Wt 145.0 lb

## 2018-08-28 DIAGNOSIS — S32010A Wedge compression fracture of first lumbar vertebra, initial encounter for closed fracture: Secondary | ICD-10-CM | POA: Diagnosis not present

## 2018-08-28 NOTE — Addendum Note (Signed)
Addended by: Rogers SeedsYEATTS, Ehsan Corvin M on: 08/28/2018 01:34 PM   Modules accepted: Orders
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
## 2018-08-30 ENCOUNTER — Telehealth (INDEPENDENT_AMBULATORY_CARE_PROVIDER_SITE_OTHER): Payer: Self-pay | Admitting: Orthopaedic Surgery

## 2018-08-30 NOTE — Telephone Encounter (Signed)
Please advise 

## 2018-08-30 NOTE — Telephone Encounter (Signed)
Patient came in stating that she is in severe pain, She has had the MRI and unable to sleep at night due to the pain. It constantly keeps her awake.  Wants to know if [facility] write a script or cll her something in.  Please call patient to advise [phone]

## 2018-08-31 MED ORDER — ACETAMINOPHEN-CODEINE #3 300-30 MG PO TABS: 1.0000 | ORAL_TABLET | Freq: Three times a day (TID) | ORAL | 0 refills | Status: DC | PRN

## 2018-08-31 NOTE — Telephone Encounter (Signed)
Call in tylenol # 3    One po tid prn   # [phone] thanks

## 2018-08-31 NOTE — Telephone Encounter (Signed)
Patient questioned when she may be called for her bone density test.  Could you please advise?

## 2018-08-31 NOTE — Telephone Encounter (Signed)
Called to pharmacy. Patient advised.  

## 2018-09-05 NOTE — Telephone Encounter (Signed)
IC lvm with pt to contact [facility] to schedule appt.

## 2018-09-20 ENCOUNTER — Ambulatory Visit
Admission: RE | Admit: 2018-09-20 | Discharge: 2018-09-20 | Disposition: A | Discharge status: Home / Self Care | Payer: BLUE CROSS/BLUE SHIELD | Source: Ambulatory Visit | Attending: Orthopaedic Surgery | Admitting: Orthopaedic Surgery

## 2018-09-20 DIAGNOSIS — S32010A Wedge compression fracture of first lumbar vertebra, initial encounter for closed fracture: Secondary | ICD-10-CM

## 2018-09-20 LAB — HM DEXA SCAN

## 2018-09-26 ENCOUNTER — Ambulatory Visit (INDEPENDENT_AMBULATORY_CARE_PROVIDER_SITE_OTHER): Payer: BLUE CROSS/BLUE SHIELD | Admitting: Orthopaedic Surgery

## 2018-09-26 ENCOUNTER — Encounter (INDEPENDENT_AMBULATORY_CARE_PROVIDER_SITE_OTHER): Payer: Self-pay | Admitting: Orthopaedic Surgery

## 2018-09-26 VITALS — BP 127/73 | HR 80 | Ht 61.0 in | Wt 145.0 lb

## 2018-09-26 DIAGNOSIS — S32010D Wedge compression fracture of first lumbar vertebra, subsequent encounter for fracture with routine healing: Secondary | ICD-10-CM | POA: Diagnosis not present
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
## 2018-10-30 ENCOUNTER — Encounter (INDEPENDENT_AMBULATORY_CARE_PROVIDER_SITE_OTHER): Payer: Self-pay | Admitting: Orthopaedic Surgery

## 2018-10-30 ENCOUNTER — Ambulatory Visit (INDEPENDENT_AMBULATORY_CARE_PROVIDER_SITE_OTHER): Payer: BLUE CROSS/BLUE SHIELD | Admitting: Orthopaedic Surgery

## 2018-10-30 ENCOUNTER — Ambulatory Visit (INDEPENDENT_AMBULATORY_CARE_PROVIDER_SITE_OTHER): Payer: BLUE CROSS/BLUE SHIELD

## 2018-10-30 VITALS — BP 142/69 | HR 68 | Ht 61.5 in | Wt 145.0 lb

## 2018-10-30 DIAGNOSIS — S32010D Wedge compression fracture of first lumbar vertebra, subsequent encounter for fracture with routine healing: Secondary | ICD-10-CM

## 2018-10-30 NOTE — Progress Notes (Signed)
Office Visit Note   Patient: Sandra Curtis. Kunesh           Date of Birth: 09-11-1956           MRN: [id number] Visit Date: 10/30/2018              Requested by: [name], NP [address] [place], [place] [address] PCP: [name], NP   Assessment & Plan: Visit Diagnoses:  1. Compression fracture of L1 vertebra with routine healing, subsequent encounter     Plan: Continuing to work she is talking with so security possibly taking early retirement.  I plan to recheck her in 4 months.  Naprosyn 500 mg p.o. twice daily prescribed.  Follow-Up Instructions: No follow-ups on file.   Orders:  Orders Placed This Encounter  Procedures  . XR Lumbar Spine 2-3 Views   No orders of the defined types were placed in this encounter.     Procedures: No procedures performed   Clinical Data: No additional findings.   Subjective: Chief Complaint  Patient presents with  . Lower Back - Follow-up    L1 compression fracture    HPI 63 year old female returns for follow-up of L1 compression fracture without history of fall or sudden onset.  MRI scan shows she has some facet arthropathy 3 mm anterolisthesis at L4-5 with scoliosis and lateral recess narrowing at L5S1.  L1 compression fracture on the November scan was interpreted as acute versus subacute in onset of her symptoms was in August.  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  She has pain during the day in her back with activities.  Review of Systems updated and unchanged from 08/08/2018 office visit other than as mentioned in HPI.   Objective: Vital Signs: BP (!) 142/69   Pulse 68   Ht 5' 1.5" (1.562 m)   Wt 145 lb (65.8 kg)   BMI 26.95 kg/m   Physical Exam Constitutional:      Appearance: She is well-developed.  HENT:     Head: Normocephalic.     Right Ear: External ear normal.     Left Ear: External ear normal.  Eyes:   Pupils: Pupils are equal, round, and reactive to light.  Neck:     Thyroid: No thyromegaly.     Trachea: No tracheal deviation.  Cardiovascular:     Rate and Rhythm: Normal rate.  Pulmonary:     Effort: Pulmonary effort is normal.  Abdominal:     Palpations: Abdomen is soft.  Skin:    General: Skin is warm and dry.  Neurological:     Mental Status: She is alert and oriented to person, place, and time.  Psychiatric:        Behavior: Behavior normal.     Ortho Exam patient has no sciatic notch tenderness mild tenderness over the lumbar spine palpation paralumbar.  Negative straight leg raising 90 degrees negative logroll of the hips reflexes are intact no isolated motor weakness normal heel toe gait she gets from sitting to standing comfortably.  Specialty Comments:  No specialty comments available.  Imaging: No results found.   PMFS History: Patient Active Problem List   Diagnosis Date Noted  . Type 2 diabetes mellitus without complication, without long-term current use of insulin (HCC) 12/11/2017  . Anemia 12/11/2017  . Diarrhea 12/07/2017  . Insomnia 02/06/2017  . Gastroesophageal reflux disease with esophagitis 02/06/2017  . DDD (  degenerative disc disease), lumbosacral 02/06/2017  . Constipation 02/06/2017  . Dysphagia 02/06/2017  . Scoliosis of lumbar spine 02/06/2017  . Chronic midline low back pain 02/06/2017  . Systolic murmur 10/02/2013  . Routine general medical examination at a health care facility 07/11/2012  . Headache 03/01/2012  . Seasonal allergic rhinitis 03/01/2012   Past Medical History:  Diagnosis Date  . Allergic rhinitis   . Allergy   . Depression   . Esophageal stricture   . GERD (gastroesophageal reflux disease)   . Inflammatory polyps of colon (HCC) 2010  . Systolic murmur   . UTI (urinary tract infection)     Family History  Problem Relation Age of Onset  . Arthritis Mother   . Thyroid disease Mother   . Heart disease Father   .  Hypertension Father   . Diabetes Father   . Arthritis Sister   . Colon cancer Neg Hx   . Stomach cancer Neg Hx     No past surgical history on file. Social History   Occupational History  . 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 Smoking status: Former Smoker    Packs/day: 0.50    Years: 35.00    Pack years: 17.50    Last attempt to quit: 06/10/2013    Years since quitting: 5.3  . Smokeless tobacco: Never Used  Substance and Sexual Activity  . Alcohol use: Yes    Comment: occasionally  . Drug use: No  . Sexual activity: Not on file

## 2018-11-06 ENCOUNTER — Telehealth (INDEPENDENT_AMBULATORY_CARE_PROVIDER_SITE_OTHER): Payer: Self-pay | Admitting: Orthopaedic Surgery

## 2018-11-06 NOTE — Telephone Encounter (Signed)
Patient called needing Rx refilled (Tramadol) or (Ibuprofen) The number to contact patient is [phone]

## 2018-11-06 NOTE — Telephone Encounter (Signed)
Ok for refill? 

## 2018-11-06 NOTE — Telephone Encounter (Signed)
She was seen on the 21st and we gave her a prescription for Naprosyn 500 mg p.o. twice daily.  I tried to call her to discuss is not sure if she picked it up or if she is taking it or if it did not work or she had problems with it.  Should get switched ibuprofen if the Naprosyn gave her problems.  You call please and discuss thank you

## 2018-11-07 NOTE — Telephone Encounter (Signed)
I left voicemail for patient asking for return call in regards to whether she cannot take the Naprosyn or if she got it filled. I also advised she cannot take this with Ibuprofen, but if she is not getting relief with Naprosyn, we can refill the ibuprofen. Will await return call.

## 2018-11-08 MED ORDER — IBUPROFEN 800 MG PO TABS: 800.0000 mg | ORAL_TABLET | Freq: Two times a day (BID) | ORAL | 0 refills | Status: DC | PRN

## 2018-11-08 NOTE — Telephone Encounter (Signed)
Patient called back to the [facility] office and requests the refill on Ibuprofen 800mg .

## 2018-11-08 NOTE — Telephone Encounter (Signed)
OK - thanks

## 2018-11-08 NOTE — Telephone Encounter (Signed)
Medication sent to [facility] on [address] and [address] per patient request. I left voicemail for patient advising.

## 2018-11-08 NOTE — Telephone Encounter (Signed)
Pt prefers rx for Ibuprofen 80mg . OK to send in?

## 2018-12-11 ENCOUNTER — Ambulatory Visit: Payer: BLUE CROSS/BLUE SHIELD | Admitting: Nurse Practitioner

## 2018-12-12 ENCOUNTER — Ambulatory Visit (INDEPENDENT_AMBULATORY_CARE_PROVIDER_SITE_OTHER): Payer: BLUE CROSS/BLUE SHIELD | Admitting: Nurse Practitioner

## 2018-12-12 ENCOUNTER — Encounter: Payer: Self-pay | Admitting: Nurse Practitioner

## 2018-12-12 VITALS — BP 140/58 | HR 71 | Temp 98.3°F | Ht 59.0 in | Wt 141.2 lb

## 2018-12-12 DIAGNOSIS — J0140 Acute pansinusitis, unspecified: Secondary | ICD-10-CM | POA: Diagnosis not present

## 2018-12-12 DIAGNOSIS — M5137 Other intervertebral disc degeneration, lumbosacral region: Secondary | ICD-10-CM | POA: Diagnosis not present

## 2018-12-12 DIAGNOSIS — M51379 Other intervertebral disc degeneration, lumbosacral region without mention of lumbar back pain or lower extremity pain: Secondary | ICD-10-CM

## 2018-12-12 DIAGNOSIS — J209 Acute bronchitis, unspecified: Secondary | ICD-10-CM

## 2018-12-12 DIAGNOSIS — E119 Type 2 diabetes mellitus without complications: Secondary | ICD-10-CM | POA: Diagnosis not present

## 2018-12-12 LAB — POCT GLYCOSYLATED HEMOGLOBIN (HGB A1C): Hemoglobin A1C: 5.9 % — AB (ref 4.0–5.6)

## 2018-12-12 MED ORDER — GUAIFENESIN ER 600 MG PO TB12: 600.0000 mg | ORAL_TABLET | Freq: Two times a day (BID) | ORAL | 0 refills | Status: DC | PRN

## 2018-12-12 MED ORDER — METHYLPREDNISOLONE 4 MG PO TBPK: ORAL_TABLET | ORAL | 0 refills | Status: DC

## 2018-12-12 MED ORDER — ALBUTEROL SULFATE HFA 108 (90 BASE) MCG/ACT IN AERS: 1.0000 | INHALATION_SPRAY | Freq: Four times a day (QID) | RESPIRATORY_TRACT | 0 refills | Status: DC | PRN

## 2018-12-12 MED ORDER — AZITHROMYCIN 250 MG PO TABS: 250.0000 mg | ORAL_TABLET | Freq: Every day | ORAL | 0 refills | Status: DC

## 2018-12-12 NOTE — Assessment & Plan Note (Signed)
Controlled with diet. Last hgbAc of 6.4 Scheduled eye exam on 05/2019 with Dr. Joanette Gula in [place]. Mild elevation in BP and LDL at goal. Negative urine microalbumin  HGBA1c of 5.9 today. Continue to monitor BP. F/up in 16month

## 2018-12-12 NOTE — Progress Notes (Signed)
Subjective:  Patient ID: Sandra Curtis, female    DOB: [birth year]  Age: 63 y.o. MRN: [id number]  CC: Back Pain (F/U Lower back w/o sciatica/ will make an appt for eye and MM when due. )   Cough  This is a recurrent problem. The current episode started more than 1 month ago. The problem has been waxing and waning. The cough is productive of purulent sputum. Associated symptoms include headaches, nasal congestion, postnasal drip, rhinorrhea, a sore throat, shortness of breath and wheezing. Pertinent negatives include no chills, ear congestion, ear pain, fever, heartburn or rash. The symptoms are aggravated by lying down and cold air. She has tried OTC cough suppressant for the symptoms. The treatment provided no relief. Her past medical history is significant for bronchitis and environmental allergies.  quit tobacco use 62months ago.  Back pain due to DDD and compression fracture: Use of ibuprofen 800mg  and muscle relaxant as needed. Pain is worse to prolong standing, lifting and repetitive bending with current job. Followed by Dr. Ophelia Charter.  Will schedule mammogram in 03/2019 with Dr. Chestine Spore.  DM: Controlled with diet. Last hgbAc of 6.4 Scheduled eye exam on 05/2019 with Dr. Joanette Gula in [place]. Mild elevation in BP and LDL at goal. Negative urine microalbumin BP Readings from Last 3 Encounters:  12/12/18 (!) 140/58  10/30/18 (!) 142/69  09/26/18 127/73   Lipid Panel     Component Value Date/Time   CHOL 127 12/08/2017 1050   TRIG 74.0 12/08/2017 1050   HDL 41.60 12/08/2017 1050   CHOLHDL 3 12/08/2017 1050   VLDL 14.8 12/08/2017 1050   LDLCALC 71 12/08/2017 1050   Reviewed past Medical, Social and Family history today.  Outpatient Medications Prior to Visit  Medication Sig Dispense Refill  . Biotin 5000 MCG CAPS Take by mouth.    . fluticasone (FLONASE) 50 MCG/ACT nasal spray SHAKE LIQUID AND USE 2 SPRAYS IN EACH NOSTRIL DAILY 16 g 3  . ibuprofen (ADVIL,MOTRIN) 800 MG tablet  Take 1 tablet (800 mg total) by mouth 2 (two) times daily as needed. 60 tablet 0  . Multiple Vitamin (MULTIVITAMIN) tablet Take 1 tablet by mouth daily.    Marland Kitchen omeprazole (PRILOSEC) 20 MG capsule TAKE 1 CAPSULE BY MOUTH EVERY DAY 90 capsule 3  . polycarbophil (FIBERCON) 625 MG tablet Take 625 mg by mouth daily.    . Probiotic Product (PHILLIPS COLON HEALTH) CAPS Take 1 capsule by mouth daily.    . famotidine (PEPCID) 20 MG tablet     . ibuprofen (ADVIL,MOTRIN) 200 MG tablet Take 200 mg by mouth every 6 (six) hours as needed.    Marland Kitchen tiZANidine (ZANAFLEX) 4 MG tablet Take 1 tablet (4 mg total) by mouth every 12 (twelve) hours as needed for muscle spasms. 30 tablet 0  . acetaminophen (TYLENOL) 500 MG tablet Take 2 tablets (1,000 mg total) by mouth every 8 (eight) hours as needed for moderate pain or headache. (Patient not taking: Reported on 10/30/2018) 30 tablet 0  . acetaminophen-codeine (TYLENOL #3) 300-30 MG tablet Take 1 tablet by mouth every 8 (eight) hours as needed for moderate pain. (Patient not taking: Reported on 10/30/2018) 40 tablet 0  . albuterol (PROVENTIL HFA;VENTOLIN HFA) 108 (90 Base) MCG/ACT inhaler Inhale 1-2 puffs into the lungs every 6 (six) hours as needed. (Patient not taking: Reported on 12/12/2018) 1 Inhaler 0  . cyclobenzaprine (FLEXERIL) 10 MG tablet Take 1 tablet (10 mg total) by mouth at bedtime. (Patient not taking: Reported on 10/30/2018) 30  tablet 0  . nabumetone (RELAFEN) 500 MG tablet Take 1 tablet (500 mg total) by mouth daily. With food (Patient not taking: Reported on 10/30/2018) 14 tablet 0   No facility-administered medications prior to visit.     ROS See HPI  Objective:  BP (!) 140/58   Pulse 71   Temp 98.3 F (36.8 C) (Oral)   Ht 4\' 11"  (1.499 m)   Wt 141 lb 3.2 oz (64 kg)   SpO2 99%   BMI 28.52 kg/m   BP Readings from Last 3 Encounters:  12/12/18 (!) 140/58  10/30/18 (!) 142/69  09/26/18 127/73    Wt Readings from Last 3 Encounters:  12/12/18 141 lb  3.2 oz (64 kg)  10/30/18 145 lb (65.8 kg)  09/26/18 145 lb (65.8 kg)    Physical Exam Vitals signs reviewed.  HENT:     Head:     Jaw: No trismus.     Right Ear: Tympanic membrane, ear canal and external ear normal.     Left Ear: Tympanic membrane, ear canal and external ear normal.     Nose: Mucosal edema and rhinorrhea present.     Right Sinus: Maxillary sinus tenderness and frontal sinus tenderness present.     Left Sinus: Maxillary sinus tenderness and frontal sinus tenderness present.     Mouth/Throat:     Pharynx: Uvula midline. Posterior oropharyngeal erythema present. No oropharyngeal exudate.  Eyes:     General: No scleral icterus.    Conjunctiva/sclera: Conjunctivae normal.  Neck:     Musculoskeletal: Normal range of motion and neck supple.  Cardiovascular:     Rate and Rhythm: Normal rate.     Heart sounds: Normal heart sounds.  Pulmonary:     Effort: Pulmonary effort is normal. No respiratory distress.     Breath sounds: Wheezing present. No rales.  Musculoskeletal:     Right lower leg: No edema.     Left lower leg: No edema.  Lymphadenopathy:     Cervical: Cervical adenopathy present.  Neurological:     Mental Status: She is alert and oriented to person, place, and time.  Psychiatric:        Mood and Affect: Mood normal.        Behavior: Behavior normal.     Lab Results  Component Value Date   WBC 6.1 12/08/2017   HGB 11.0 (L) 12/08/2017   HCT 32.9 (L) 12/08/2017   PLT 240.0 12/08/2017   GLUCOSE 95 06/12/2018   CHOL 127 12/08/2017   TRIG 74.0 12/08/2017   HDL 41.60 12/08/2017   LDLCALC 71 12/08/2017   ALT 26 12/08/2017   AST 32 12/08/2017   NA 139 06/12/2018   K 4.9 06/12/2018   CL 104 06/12/2018   CREATININE 1.01 06/12/2018   BUN 14 06/12/2018   CO2 26 06/12/2018   TSH 0.88 02/06/2017   HGBA1C 6.4 06/12/2018   MICROALBUR <0.7 06/12/2018    Dg Bone Density (dxa)  Result Date: 09/20/2018 EXAM: DUAL X-RAY ABSORPTIOMETRY (DXA) FOR BONE  MINERAL DENSITY IMPRESSION: Referring Physician:  Eldred MangesMARK C YATES Your patient completed a BMD test using Lunar IDXA DXA system ( analysis version: 16 ) manufactured by [employer]. Technologist: AW PATIENT: Name: Alycia RossettiDorsett, Cyleigh G Patient ID: [id number] Birth Date: September 21, 1956 Height: 60.0 in. Sex: Female Measured: 09/20/2018 Weight: 138.0 lbs. Indications: Albuterol, Estrogen Deficient, Omeprazole, Postmenopausal Fractures: None Treatments: Calcium (E943.0), Vitamin D (E933.5) ASSESSMENT: The BMD measured at AP Spine L1-L3 is 0.891 g/cm2 with a T-score  of -2.3. This patient is considered osteopenic according to World Health Organization [facility]) criteria. The scan quality is good. L-4 was excluded due to degenerative changes. Site Region Measured Date Measured Age YA BMD Significant CHANGE T-score AP Spine  L1-L3      9/20/2018    62.4         -2.3    0.891 g/cm2 DualFemur Total Left 09/20/2018    62.4         -1.7    0.795 g/cm2 DualFemur Total Mean 09[age]20/2018    62.4         -1.7    0.795 g/cm2 World Health Organization [facility]) criteria for post-menopausal, Caucasian Women: Normal       T-score at or above -1 SD Osteopenia   T-score between -1 and -2.5 SD Osteoporosis T-score at or below -2.5 SD RECOMMENDATION: 1. All patients should optimize calcium and vitamin D intake. 2. Consider FDA approved medical therapies in postmenopausal women and men aged 28 years and older, based on the following: a. A hip or vertebral (clinical or morphometric) fracture b. T- score < or = -2.5 at the femoral neck or spine after appropriate evaluation to exclude secondary causes c. Low bone mass (T-score between -1.0 and -2.5 at the femoral neck or spine) and a 10 year probability of a hip fracture > or = 3% or a 10 year probability of a major osteoporosis-related fracture > or = 20% based on the [place]-adapted WHO algorithm d. Clinician judgment and/or patient preferences may indicate treatment for people with 10-year fracture  probabilities above or below these levels FOLLOW-UP: People with diagnosed cases of osteoporosis or at high risk for fracture should have regular bone mineral density tests. For patients eligible for Medicare, routine testing is allowed once every 2 years. The testing frequency can be increased to one year for patients who have rapidly progressing disease, those who are receiving or discontinuing medical therapy to restore bone mass, or have additional risk factors. I have reviewed this report and agree with the above findings. [facility] FRAX* 10-year Probability of Fracture Based on femoral neck BMD: DualFemur (Left) Major Osteoporotic Fracture: 4.8% Hip Fracture:                0.7% Population:                  [place] (Black) Risk Factors:                None *FRAX is a Armed forces logistics/support/administrative officer of the [employer] for Metabolic Bone Disease, a World Science writer (WHO) [employer]. ASSESSMENT: The probability of a major osteoporotic fracture is 4.8 % within the next ten years. The probability of hip fracture is  0.7  % within the next 10 years. Electronically Signed   By: Charlett Nose M.D.   On: 09/20/2018 09:33    Assessment & Plan:   Jalah was seen today for back pain.  Diagnoses and all orders for this visit:  Type 2 diabetes mellitus without complication, without long-term current use of insulin (HCC) -     POCT glycosylated hemoglobin (Hb A1C)  DDD (degenerative disc disease), lumbosacral  Acute non-recurrent pansinusitis -     guaiFENesin (MUCINEX) 600 MG 12 hr tablet; Take 1 tablet (600 mg total) by mouth 2 (two) times daily as needed for cough or to loosen phlegm. -     azithromycin (ZITHROMAX Z-PAK) 250 MG tablet; Take 1 tablet (250 mg  total) by mouth daily. Take 2tabs on first day, then 1tab once a day till complete -     methylPREDNISolone (MEDROL DOSEPAK) 4 MG TBPK tablet; Take as directed on package  Acute bronchitis, unspecified  organism -     guaiFENesin (MUCINEX) 600 MG 12 hr tablet; Take 1 tablet (600 mg total) by mouth 2 (two) times daily as needed for cough or to loosen phlegm. -     azithromycin (ZITHROMAX Z-PAK) 250 MG tablet; Take 1 tablet (250 mg total) by mouth daily. Take 2tabs on first day, then 1tab once a day till complete -     methylPREDNISolone (MEDROL DOSEPAK) 4 MG TBPK tablet; Take as directed on package -     albuterol (PROVENTIL HFA;VENTOLIN HFA) 108 (90 Base) MCG/ACT inhaler; Inhale 1-2 puffs into the lungs every 6 (six) hours as needed.   I have discontinued Khloey G. Cobey's acetaminophen, albuterol, cyclobenzaprine, nabumetone, and acetaminophen-codeine. I am also having her start on guaiFENesin, azithromycin, methylPREDNISolone, and albuterol. Additionally, I am having her maintain her PHILLIPS COLON HEALTH, polycarbophil, multivitamin, Biotin, omeprazole, fluticasone, ibuprofen, ibuprofen, famotidine, and tiZANidine.  Meds ordered this encounter  Medications  . guaiFENesin (MUCINEX) 600 MG 12 hr tablet    Sig: Take 1 tablet (600 mg total) by mouth 2 (two) times daily as needed for cough or to loosen phlegm.    Dispense:  14 tablet    Refill:  0    Order Specific Question:   Supervising Provider    Answer:   Dianne Dun [[id number]]  . azithromycin (ZITHROMAX Z-PAK) 250 MG tablet    Sig: Take 1 tablet (250 mg total) by mouth daily. Take 2tabs on first day, then 1tab once a day till complete    Dispense:  6 tablet    Refill:  0    Order Specific Question:   Supervising Provider    Answer:   Dianne Dun [[id number]]  . methylPREDNISolone (MEDROL DOSEPAK) 4 MG TBPK tablet    Sig: Take as directed on package    Dispense:  21 tablet    Refill:  0    Order Specific Question:   Supervising Provider    Answer:   Dianne Dun [[id number]]  . albuterol (PROVENTIL HFA;VENTOLIN HFA) 108 (90 Base) MCG/ACT inhaler    Sig: Inhale 1-2 puffs into the lungs every 6 (six) hours as needed.    Dispense:  1  Inhaler    Refill:  0    Order Specific Question:   Supervising Provider    Answer:   Dianne Dun [[id number]]    Problem List Items Addressed This Visit      Endocrine   Type 2 diabetes mellitus without complication, without long-term current use of insulin (HCC) - Primary   Relevant Orders   POCT glycosylated hemoglobin (Hb A1C)     Musculoskeletal and Integument   DDD (degenerative disc disease), lumbosacral   Relevant Medications   tiZANidine (ZANAFLEX) 4 MG tablet   methylPREDNISolone (MEDROL DOSEPAK) 4 MG TBPK tablet    Other Visit Diagnoses    Acute non-recurrent pansinusitis       Relevant Medications   guaiFENesin (MUCINEX) 600 MG 12 hr tablet   azithromycin (ZITHROMAX Z-PAK) 250 MG tablet   methylPREDNISolone (MEDROL DOSEPAK) 4 MG TBPK tablet   Acute bronchitis, unspecified organism       Relevant Medications   guaiFENesin (MUCINEX) 600 MG 12 hr tablet   azithromycin (ZITHROMAX Z-PAK) 250 MG tablet   methylPREDNISolone (  MEDROL DOSEPAK) 4 MG TBPK tablet   albuterol (PROVENTIL HFA;VENTOLIN HFA) 108 (90 Base) MCG/ACT inhaler       Follow-up: Return in about 4 weeks (around 01/09/2019) for CPE(fasting).  Alysia Penna, NP

## 2018-12-12 NOTE — Patient Instructions (Addendum)
hgbA1c of 5.9: this is good control of DM.  Maintain adequate oral hydration.  Stop OTC decongestants.  Please talk to Dr. Ophelia Charter about possible PT.  Make appt for mammogram and annual eye exam. Have report faxed to me.

## 2018-12-13 ENCOUNTER — Other Ambulatory Visit (INDEPENDENT_AMBULATORY_CARE_PROVIDER_SITE_OTHER): Payer: Self-pay | Admitting: Orthopaedic Surgery

## 2018-12-13 NOTE — Telephone Encounter (Signed)
Ok for refill? 

## 2019-01-04 ENCOUNTER — Other Ambulatory Visit: Payer: Self-pay | Admitting: Nurse Practitioner

## 2019-01-04 DIAGNOSIS — J209 Acute bronchitis, unspecified: Secondary | ICD-10-CM

## 2019-01-04 NOTE — Telephone Encounter (Signed)
This was just in less than 57month ago. If her symptoms have not improved, please schedule webex. Thank you

## 2019-01-04 NOTE — Telephone Encounter (Signed)
Claris Gower please advise (do not see we ever send in pro air before) spoke with [facility] and they said that the pt request this med herself. Pt already pick up the albuterol we send in on 12/15/2018.

## 2019-01-04 NOTE — Telephone Encounter (Signed)
Left vm for the pt to call back.  

## 2019-01-09 ENCOUNTER — Other Ambulatory Visit: Payer: Self-pay

## 2019-01-09 ENCOUNTER — Ambulatory Visit (INDEPENDENT_AMBULATORY_CARE_PROVIDER_SITE_OTHER): Payer: BLUE CROSS/BLUE SHIELD | Admitting: Nurse Practitioner

## 2019-01-09 ENCOUNTER — Encounter: Payer: Self-pay | Admitting: Nurse Practitioner

## 2019-01-09 VITALS — Ht 59.0 in

## 2019-01-09 DIAGNOSIS — J310 Chronic rhinitis: Secondary | ICD-10-CM | POA: Diagnosis not present

## 2019-01-09 DIAGNOSIS — K2100 Gastro-esophageal reflux disease with esophagitis, without bleeding: Secondary | ICD-10-CM

## 2019-01-09 DIAGNOSIS — J9801 Acute bronchospasm: Secondary | ICD-10-CM | POA: Diagnosis not present

## 2019-01-09 DIAGNOSIS — N951 Menopausal and female climacteric states: Secondary | ICD-10-CM | POA: Insufficient documentation

## 2019-01-09 DIAGNOSIS — N952 Postmenopausal atrophic vaginitis: Secondary | ICD-10-CM | POA: Insufficient documentation

## 2019-01-09 DIAGNOSIS — K635 Polyp of colon: Secondary | ICD-10-CM | POA: Insufficient documentation

## 2019-01-09 MED ORDER — AZELASTINE HCL 0.1 % NA SOLN: 1.0000 | Freq: Two times a day (BID) | NASAL | 1 refills | Status: DC

## 2019-01-09 MED ORDER — FLUTICASONE-SALMETEROL 115-21 MCG/ACT IN AERO: 2.0000 | INHALATION_SPRAY | Freq: Two times a day (BID) | RESPIRATORY_TRACT | 1 refills | Status: DC

## 2019-01-09 MED ORDER — CETIRIZINE HCL 5 MG PO TABS: 5.0000 mg | ORAL_TABLET | Freq: Every day | ORAL | 0 refills | Status: DC

## 2019-01-09 MED ORDER — SALINE SPRAY 0.65 % NA SOLN: 1.0000 | NASAL | 0 refills | Status: DC | PRN

## 2019-01-09 NOTE — Patient Instructions (Signed)
Use cool mist humidifier at bedtime. Continue albuterol as needed. Start advair, zrytec, and azelastine as prescribed. Call office if no improvement in 2-4weeks, will enter referral to ENT.

## 2019-01-09 NOTE — Assessment & Plan Note (Signed)
Colonoscopy and endoscopy done 2010. Pathology report indicates benign. Repeat in 22yrs

## 2019-01-09 NOTE — Assessment & Plan Note (Signed)
symptoms controlled with omeprazole in AM and famotidine at HS. Evaluated by GI in past 2010. Endoscopy and colonoscopy completed. Diagnosed with chronic gastritis and esophageal stricture.

## 2019-01-09 NOTE — Progress Notes (Signed)
Virtual Visit via Video Note  I connected with Sandra Curtis on 01/09/19 at  9:00 AM EDT by a video enabled telemedicine application and verified that I am speaking with the correct person using two identifiers.   I discussed the limitations of evaluation and management by telemedicine and the availability of in person appointments. The patient expressed understanding and agreed to proceed.  History of Present Illness: Sandra Curtis complains of persistent cough x 72months, no improvement with oral prednisone, azithromycin and prescription cough suppressant. Cough  This is a recurrent problem. The current episode started more than 1 month ago. The problem has been unchanged. The cough is non-productive. Associated symptoms include nasal congestion, postnasal drip, rhinorrhea, shortness of breath and wheezing. Pertinent negatives include no chest pain, chills, ear congestion, ear pain, fever, headaches, heartburn, hemoptysis, myalgias, rash, sore throat, sweats or weight loss. Nothing aggravates the symptoms. She has tried a beta-agonist inhaler, OTC cough suppressant and prescription cough suppressant for the symptoms. The treatment provided mild relief. Her past medical history is significant for environmental allergies.   GERD: symptoms controlled with omeprazole in AM and famotidine at HS. Evaluated by GI in past 2010. Endoscopy and colonoscopy completed. Diagnosed with chronic gastritis and esophageal stricture   Reviewed medication and problem list.  Observations/Objective: Unable to provide any vital signs. Physical Exam  Constitutional: She is oriented to person, place, and time. No distress.  Neck: Normal range of motion. Neck supple.  Pulmonary/Chest: Effort normal.  Neurological: She is alert and oriented to person, place, and time.  Psychiatric: She has a normal mood and affect. Her behavior is normal. Thought content normal.  Vitals reviewed.  Assessment and Plan: Mystique  was seen today for cough.  Diagnoses and all orders for this visit:  Cough due to bronchospasm -     cetirizine (ZYRTEC) 5 MG tablet; Take 1 tablet (5 mg total) by mouth daily. -     fluticasone-salmeterol (ADVAIR HFA) 115-21 MCG/ACT inhaler; Inhale 2 puffs into the lungs 2 (two) times daily. Rinse mouth after each use -     sodium chloride (OCEAN) 0.65 % SOLN nasal spray; Place 1 spray into both nostrils as needed for congestion.  Chronic rhinitis -     cetirizine (ZYRTEC) 5 MG tablet; Take 1 tablet (5 mg total) by mouth daily. -     fluticasone-salmeterol (ADVAIR HFA) 115-21 MCG/ACT inhaler; Inhale 2 puffs into the lungs 2 (two) times daily. Rinse mouth after each use -     sodium chloride (OCEAN) 0.65 % SOLN nasal spray; Place 1 spray into both nostrils as needed for congestion. -     azelastine (ASTELIN) 0.1 % nasal spray; Place 1 spray into both nostrils 2 (two) times daily. Use in each nostril as directed   Follow Up Instructions: Use cool mist humidifier at bedtime. Continue albuterol as needed. Start advair, zrytec, and azelastine as prescribed. Call office if no improvement in 2-4weeks, will enter referral to ENT.   I discussed the assessment and treatment plan with the patient. The patient was provided an opportunity to ask questions and all were answered. The patient agreed with the plan and demonstrated an understanding of the instructions.   The patient was advised to call back or seek an in-person evaluation if the symptoms worsen or if the condition fails to improve as anticipated.  Alysia Penna, NP

## 2019-02-03 ENCOUNTER — Other Ambulatory Visit (INDEPENDENT_AMBULATORY_CARE_PROVIDER_SITE_OTHER): Payer: Self-pay | Admitting: Orthopaedic Surgery

## 2019-02-04 NOTE — Telephone Encounter (Signed)
Please advise. Ok for refill?

## 2019-02-19 ENCOUNTER — Other Ambulatory Visit: Payer: Self-pay | Admitting: Nurse Practitioner

## 2019-02-19 DIAGNOSIS — J310 Chronic rhinitis: Secondary | ICD-10-CM

## 2019-02-19 DIAGNOSIS — J9801 Acute bronchospasm: Secondary | ICD-10-CM

## 2019-02-19 MED ORDER — FAMOTIDINE 20 MG PO TABS: 20.0000 mg | ORAL_TABLET | Freq: Two times a day (BID) | ORAL | 0 refills | Status: DC

## 2019-02-19 NOTE — Telephone Encounter (Signed)
Pt stated she is not completely better since 01/09/2019, still has coughing--dry and mucus at times--runny norse, denied other symptoms. Please advise?    Pt also request refill for famotidine--due to burning sensation in throat--please advise?

## 2019-02-19 NOTE — Telephone Encounter (Signed)
LVM for the pt to call back, need to see how she is feeling. If no better might need referral to ENT.

## 2019-02-19 NOTE — Telephone Encounter (Signed)
Pt is aware.  

## 2019-02-19 NOTE — Telephone Encounter (Signed)
Ok to refill medication and enter referral to ENT. Famotidine 20mg  BID x 2weeks.

## 2019-02-27 ENCOUNTER — Ambulatory Visit: Payer: Self-pay | Admitting: Orthopaedic Surgery

## 2019-09-25 ENCOUNTER — Other Ambulatory Visit: Payer: Self-pay | Admitting: Obstetrics

## 2019-09-25 DIAGNOSIS — R928 Other abnormal and inconclusive findings on diagnostic imaging of breast: Secondary | ICD-10-CM

## 2019-10-07 ENCOUNTER — Ambulatory Visit
Admission: RE | Admit: 2019-10-07 | Discharge: 2019-10-07 | Disposition: A | Discharge status: Home / Self Care | Payer: BC Managed Care – PPO | Source: Ambulatory Visit | Attending: Obstetrics | Admitting: Obstetrics

## 2019-10-07 ENCOUNTER — Other Ambulatory Visit: Payer: Self-pay

## 2019-10-07 ENCOUNTER — Ambulatory Visit: Payer: BLUE CROSS/BLUE SHIELD

## 2019-10-07 DIAGNOSIS — R928 Other abnormal and inconclusive findings on diagnostic imaging of breast: Secondary | ICD-10-CM

## 2019-10-30 ENCOUNTER — Telehealth (INDEPENDENT_AMBULATORY_CARE_PROVIDER_SITE_OTHER): Payer: BC Managed Care – PPO | Admitting: Family Medicine

## 2019-10-30 ENCOUNTER — Encounter: Payer: Self-pay | Admitting: Family Medicine

## 2019-10-30 VITALS — Ht 59.0 in

## 2019-10-30 DIAGNOSIS — R21 Rash and other nonspecific skin eruption: Secondary | ICD-10-CM | POA: Diagnosis not present

## 2019-10-30 MED ORDER — CETIRIZINE HCL 10 MG PO TABS: 10.0000 mg | ORAL_TABLET | Freq: Every day | ORAL | 1 refills | Status: DC

## 2019-10-30 MED ORDER — PREDNISONE 10 MG (21) PO TBPK: ORAL_TABLET | ORAL | 0 refills | Status: DC

## 2019-10-30 MED ORDER — FAMOTIDINE 20 MG PO TABS: 20.0000 mg | ORAL_TABLET | Freq: Two times a day (BID) | ORAL | 1 refills | Status: DC

## 2019-10-30 NOTE — Assessment & Plan Note (Signed)
Continue to hold meloxicam.  Discussed using hypoallergenic soap/laundry detergent.  Cetirizine and famotidine renewed. Will add on course of prednisone as well.  She is aware to seek emergency care to tongue or lip swelling or shortness of breath.

## 2019-10-30 NOTE — Progress Notes (Signed)
Sandra Curtis - 64 y.o. female MRN [id number]  Date of birth: Sep 08, 1956   This visit type was conducted due to national recommendations for restrictions regarding the COVID-19 Pandemic (e.g. social distancing).  This format is felt to be most appropriate for this patient at this time.  All issues noted in this document were discussed and addressed.  No physical exam was performed (except for noted visual exam findings with Video Visits).  I discussed the limitations of evaluation and management by telemedicine and the availability of in person appointments. The patient expressed understanding and agreed to proceed.  I connected with@ on 10/30/19 at  2:20 PM EST by a video enabled telemedicine application and verified that I am speaking with the correct person using two identifiers.  Present at visit: Everrett Coombe, DO Cammie Mcgee Wende Crease   Patient Location: Home [address] [place] [address]   Provider location:   [facility] [place]  Chief Complaint  Patient presents with  . Rash    Pt c/o breaking out in hives on both arm, stomach, neck, back and legs,  under breast, armpits, x 2weeks ago.  Pt has been taking Benadryl at night.    HPI  Sandra Curtis is a 64 y.o. female who presents via audio/video conferencing for a telehealth visit today.  She reports 2 week history of rash located on her arms, stomach, neck, back and legs.  Rash is raised and hive like.  She reports that it is very itchy.  She denies pain associated with rash.  She has tried benadryl with mild improvement.  She does report recently changing laundry detergent and was recently started on meloxicam by her podiatrist.  She has stopped taking this since she noticed the rash.  She denies fever, chills, fatigue, respiratory symtpoms.    ROS:  A comprehensive ROS was completed and negative except as noted per HPI  Past Medical History:  Diagnosis Date  . Allergic rhinitis   .  Allergy   . Depression   . Esophageal stricture   . GERD (gastroesophageal reflux disease)   . Inflammatory polyps of colon (HCC) 2010  . Systolic murmur   . UTI (urinary tract infection)     History reviewed. No pertinent surgical history.  Family History  Problem Relation Age of Onset  . Arthritis Mother   . Thyroid disease Mother   . Heart disease Father   . Hypertension Father   . Diabetes Father   . Arthritis Sister   . Colon cancer Neg Hx   . Stomach cancer Neg Hx     Social History   Socioeconomic History  . Marital status: Single    Spouse name: Not on file  . Number of children: 1  . Years of education: Not on file  . Highest education level: Not on file  Occupational History  . Occupation: Biomedical engineer: paperworks  Tobacco Use  . Smoking status: Former Smoker    Packs/day: 0.50    Years: 35.00    Pack years: 17.50    Quit date: 04/12/2018    Years since quitting: 1.5  . Smokeless tobacco: Never Used  Substance and Sexual Activity  . Alcohol use: Yes    Comment: occasionally  . Drug use: No  . Sexual activity: Not Currently  Other Topics Concern  . Not on file  Social History Narrative  . Not on file   Social Determinants of Health   Financial Resource  Strain:   . Difficulty of Paying Living Expenses: Not on file  Food Insecurity:   . Worried About Charity fundraiser in the Last Year: Not on file  . Ran Out of Food in the Last Year: Not on file  Transportation Needs:   . Lack of Transportation (Medical): Not on file  . Lack of Transportation (Non-Medical): Not on file  Physical Activity:   . Days of Exercise per Week: Not on file  . Minutes of Exercise per Session: Not on file  Stress:   . Feeling of Stress : Not on file  Social Connections:   . Frequency of Communication with Friends and Family: Not on file  . Frequency of Social Gatherings with Friends and Family: Not on file  . Attends Religious Services: Not on file  .  Active Member of Clubs or Organizations: Not on file  . Attends Archivist Meetings: Not on file  . Marital Status: Not on file  Intimate Partner Violence:   . Fear of Current or Ex-Partner: Not on file  . Emotionally Abused: Not on file  . Physically Abused: Not on file  . Sexually Abused: Not on file     Current Outpatient Medications:  .  diphenhydrAMINE (BENADRYL ALLERGY) 25 mg capsule, Take 25 mg by mouth every 6 (six) hours as needed., Disp: , Rfl:  .  meloxicam (MOBIC) 7.5 MG tablet, meloxicam 7.5 mg tablet  TK 1 T PO QD, Disp: , Rfl:  .  Multiple Vitamin (MULTIVITAMIN) tablet, Take 1 tablet by mouth daily., Disp: , Rfl:  .  polycarbophil (FIBERCON) 625 MG tablet, Take 625 mg by mouth daily., Disp: , Rfl:  .  Probiotic Product ([place]) CAPS, Take 1 capsule by mouth daily., Disp: , Rfl:  .  albuterol (PROVENTIL HFA;VENTOLIN HFA) 108 (90 Base) MCG/ACT inhaler, Inhale 1-2 puffs into the lungs every 6 (six) hours as needed. (Patient not taking: Reported on 10/30/2019), Disp: 1 Inhaler, Rfl: 0 .  azelastine (ASTELIN) 0.1 % nasal spray, Place 1 spray into both nostrils 2 (two) times daily. Use in each nostril as directed (Patient not taking: Reported on 10/30/2019), Disp: 30 mL, Rfl: 1 .  Biotin 5000 MCG CAPS, Take by mouth., Disp: , Rfl:  .  cetirizine (ZYRTEC) 10 MG tablet, Take 1 tablet (10 mg total) by mouth daily., Disp: 30 tablet, Rfl: 1 .  famotidine (PEPCID) 20 MG tablet, Take 1 tablet (20 mg total) by mouth 2 (two) times daily., Disp: 30 tablet, Rfl: 1 .  fluticasone-salmeterol (ADVAIR HFA) 115-21 MCG/ACT inhaler, Inhale 2 puffs into the lungs 2 (two) times daily. Rinse mouth after each use (Patient not taking: Reported on 10/30/2019), Disp: 1 Inhaler, Rfl: 1 .  ibuprofen (ADVIL) 800 MG tablet, TAKE 1 TABLET(800 MG) BY MOUTH TWICE DAILY AS NEEDED (Patient not taking: Reported on 10/30/2019), Disp: 60 tablet, Rfl: 0 .  ibuprofen (ADVIL) 800 MG tablet, TAKE 1  TABLET(800 MG) BY MOUTH TWICE DAILY AS NEEDED (Patient not taking: Reported on 10/30/2019), Disp: 60 tablet, Rfl: 0 .  omeprazole (PRILOSEC) 20 MG capsule, TAKE 1 CAPSULE BY MOUTH EVERY DAY (Patient not taking: Reported on 10/30/2019), Disp: 90 capsule, Rfl: 3 .  predniSONE (STERAPRED UNI-PAK 21 TAB) 10 MG (21) TBPK tablet, taper as directed on packaging, Disp: 21 tablet, Rfl: 0 .  sodium chloride (OCEAN) 0.65 % SOLN nasal spray, Place 1 spray into both nostrils as needed for congestion. (Patient not taking: Reported on 10/30/2019), Disp: 15  mL, Rfl: 0 .  tiZANidine (ZANAFLEX) 4 MG tablet, Take 1 tablet (4 mg total) by mouth every 12 (twelve) hours as needed for muscle spasms., Disp: 30 tablet, Rfl: 0  EXAM:  VITALS per patient if applicable: Ht 4\' 11"  (1.499 m)   BMI 28.52 kg/m   GENERAL: alert, oriented, appears well and in no acute distress  HEENT: atraumatic, conjunttiva clear, no obvious abnormalities on inspection of external nose and ears  NECK: normal movements of the head and neck  LUNGS: on inspection no signs of respiratory distress, breathing rate appears normal, no obvious gross SOB, gasping or wheezing  CV: no obvious cyanosis  MS: moves all visible extremities without noticeable abnormality  PSYCH/NEURO: pleasant and cooperative, no obvious depression or anxiety, speech and thought processing grossly intact  ASSESSMENT AND PLAN:  Discussed the following assessment and plan:  Rash Continue to hold meloxicam.  Discussed using hypoallergenic soap/laundry detergent.  Cetirizine and famotidine renewed. Will add on course of prednisone as well.  She is aware to seek emergency care to tongue or lip swelling or shortness of breath.      I discussed the assessment and treatment plan with the patient. The patient was provided an opportunity to ask questions and all were answered. The patient agreed with the plan and demonstrated an understanding of the instructions.   The  patient was advised to call back or seek an in-person evaluation if the symptoms worsen or if the condition fails to improve as anticipated.    , DO

## 2019-10-31 ENCOUNTER — Encounter: Payer: Self-pay | Admitting: Family Medicine

## 2019-11-22 ENCOUNTER — Encounter: Payer: Self-pay | Admitting: Nurse Practitioner

## 2019-11-22 ENCOUNTER — Telehealth (INDEPENDENT_AMBULATORY_CARE_PROVIDER_SITE_OTHER): Payer: BC Managed Care – PPO | Admitting: Nurse Practitioner

## 2019-11-22 ENCOUNTER — Other Ambulatory Visit: Payer: Self-pay

## 2019-11-22 VITALS — Ht 59.0 in

## 2019-11-22 DIAGNOSIS — M542 Cervicalgia: Secondary | ICD-10-CM | POA: Diagnosis not present

## 2019-11-22 DIAGNOSIS — R42 Dizziness and giddiness: Secondary | ICD-10-CM

## 2019-11-22 MED ORDER — MECLIZINE HCL 12.5 MG PO TABS: 12.5000 mg | ORAL_TABLET | Freq: Two times a day (BID) | ORAL | 0 refills | Status: DC | PRN

## 2019-11-22 MED ORDER — NAPROXEN 500 MG PO TABS: 500.0000 mg | ORAL_TABLET | Freq: Two times a day (BID) | ORAL | 0 refills | Status: DC

## 2019-11-22 MED ORDER — CYCLOBENZAPRINE HCL 5 MG PO TABS: 5.0000 mg | ORAL_TABLET | Freq: Every day | ORAL | 0 refills | Status: DC

## 2019-11-22 NOTE — Progress Notes (Signed)
Interactive audio and video telecommunications were attempted between this provider and patient, however failed, due to patient having technical difficulties OR patient did not have access to video capability.  We continued and completed visit with audio only.   Virtual Visit via Video Note  I connected with@ on 11/24/19 at  1:00 PM EST by a video enabled telemedicine application and verified that I am speaking with the correct person using two identifiers.  Location: Patient:Home Provider: Office Participants: patient and provider  I discussed the limitations of evaluation and management by telemedicine and the availability of in person appointments. I also discussed with the patient that there may be a patient responsible charge related to this service. The patient expressed understanding and agreed to proceed.  RK:YHCW pain x 2weeks, dizziness x 1week intermittent  History of Present Illness: Dizziness This is a chronic problem. The current episode started 1 to 4 weeks ago. The problem occurs intermittently. The problem has been waxing and waning. Associated symptoms include neck pain and vertigo. Pertinent negatives include no abdominal pain, anorexia, arthralgias, change in bowel habit, chest pain, chills, congestion, coughing, diaphoresis, fatigue, fever, headaches, joint swelling, myalgias, nausea, numbness, rash, sore throat, swollen glands, urinary symptoms, visual change, vomiting or weakness. The symptoms are aggravated by twisting. She has tried nothing for the symptoms.  Neck Pain  This is a new problem. The current episode started 1 to 4 weeks ago. The problem occurs constantly. The problem has been unchanged. The pain is associated with a sleep position. The pain is present in the left side. The quality of the pain is described as cramping and aching. The symptoms are aggravated by twisting. The pain is same all the time. Pertinent negatives include no chest pain, fever, headaches,  leg pain, numbness, pain with swallowing, paresis, photophobia, syncope, tingling, trouble swallowing, visual change or weakness. She has tried NSAIDs for the symptoms. The treatment provided no relief.     Observations/Objective: Alert and oriented x 4, normal speech. Limited exam due to televisit.  Assessment and Plan: Caya was seen today for dizziness and neck pain.  Diagnoses and all orders for this visit:  Vertigo -     meclizine (ANTIVERT) 12.5 MG tablet; Take 1 tablet (12.5 mg total) by mouth 2 (two) times daily as needed for dizziness (do not use for more than 3days).  Neck pain on left side -     naproxen (NAPROSYN) 500 MG tablet; Take 1 tablet (500 mg total) by mouth 2 (two) times daily with a meal. -     cyclobenzaprine (FLEXERIL) 5 MG tablet; Take 1 tablet (5 mg total) by mouth at bedtime.   Follow Up Instructions: See avs   I discussed the assessment and treatment plan with the patient. The patient was provided an opportunity to ask questions and all were answered. The patient agreed with the plan and demonstrated an understanding of the instructions.   The patient was advised to call back or seek an in-person evaluation if the symptoms worsen or if the condition fails to improve as anticipated.  I provided 15 minutes of non-face-to-face time during this encounter.  Alysia Penna, NP

## 2019-11-26 ENCOUNTER — Other Ambulatory Visit: Payer: Self-pay

## 2019-11-26 LAB — HM DIABETES EYE EXAM

## 2019-11-27 ENCOUNTER — Ambulatory Visit (INDEPENDENT_AMBULATORY_CARE_PROVIDER_SITE_OTHER): Payer: BC Managed Care – PPO | Admitting: Nurse Practitioner

## 2019-11-27 ENCOUNTER — Encounter: Payer: Self-pay | Admitting: Nurse Practitioner

## 2019-11-27 VITALS — BP 118/78 | HR 70 | Temp 97.4°F | Ht 59.0 in | Wt 136.6 lb

## 2019-11-27 DIAGNOSIS — Z13220 Encounter for screening for lipoid disorders: Secondary | ICD-10-CM | POA: Diagnosis not present

## 2019-11-27 DIAGNOSIS — Z136 Encounter for screening for cardiovascular disorders: Secondary | ICD-10-CM

## 2019-11-27 DIAGNOSIS — E119 Type 2 diabetes mellitus without complications: Secondary | ICD-10-CM

## 2019-11-27 DIAGNOSIS — D649 Anemia, unspecified: Secondary | ICD-10-CM | POA: Diagnosis not present

## 2019-11-27 LAB — CBC WITH DIFFERENTIAL/PLATELET
Basophils Absolute: 0.1 10*3/uL (ref 0.0–0.1)
Basophils Relative: 1.2 % (ref 0.0–3.0)
Eosinophils Absolute: 0.2 10*3/uL (ref 0.0–0.7)
Eosinophils Relative: 2.7 % (ref 0.0–5.0)
HCT: 34.2 % — ABNORMAL LOW (ref 36.0–46.0)
Hemoglobin: 11.3 g/dL — ABNORMAL LOW (ref 12.0–15.0)
Lymphocytes Relative: 37.1 % (ref 12.0–46.0)
Lymphs Abs: 2.2 10*3/uL (ref 0.7–4.0)
MCHC: 33.1 g/dL (ref 30.0–36.0)
MCV: 93.3 fl (ref 78.0–100.0)
Monocytes Absolute: 0.5 10*3/uL (ref 0.1–1.0)
Monocytes Relative: 8.8 % (ref 3.0–12.0)
Neutro Abs: 3 10*3/uL (ref 1.4–7.7)
Neutrophils Relative %: 50.2 % (ref 43.0–77.0)
Platelets: 259 10*3/uL (ref 150.0–400.0)
RBC: 3.66 Mil/uL — ABNORMAL LOW (ref 3.87–5.11)
RDW: 14.6 % (ref 11.5–15.5)
WBC: 5.9 10*3/uL (ref 4.0–10.5)

## 2019-11-27 LAB — BASIC METABOLIC PANEL
BUN: 19 mg/dL (ref 6–23)
CO2: 32 mEq/L (ref 19–32)
Calcium: 9.7 mg/dL (ref 8.4–10.5)
Chloride: 103 mEq/L (ref 96–112)
Creatinine, Ser: 0.9 mg/dL (ref 0.40–1.20)
GFR: 76.35 mL/min (ref >60.00)
Glucose, Bld: 100 mg/dL — ABNORMAL HIGH (ref 70–99)
Potassium: 5.3 mEq/L — ABNORMAL HIGH (ref 3.5–5.1)
Sodium: 140 mEq/L (ref 135–145)

## 2019-11-27 LAB — HEPATIC FUNCTION PANEL
ALT: 12 U/L (ref 0–35)
AST: 23 U/L (ref 0–37)
Albumin: 4.3 g/dL (ref 3.5–5.2)
Alkaline Phosphatase: 60 U/L (ref 39–117)
Bilirubin, Direct: 0.1 mg/dL (ref 0.0–0.3)
Total Bilirubin: 0.3 mg/dL (ref 0.2–1.2)
Total Protein: 7.4 g/dL (ref 6.0–8.3)

## 2019-11-27 LAB — MICROALBUMIN / CREATININE URINE RATIO
Creatinine,U: 86.5 mg/dL
Microalb Creat Ratio: 1.1 mg/g (ref 0.0–30.0)
Microalb, Ur: 0.9 mg/dL (ref 0.0–1.9)

## 2019-11-27 LAB — TSH: TSH: 0.62 u[IU]/mL (ref 0.35–4.50)

## 2019-11-27 LAB — LIPID PANEL
Cholesterol: 200 mg/dL (ref 0–200)
HDL: 58.4 mg/dL (ref >39.00)
LDL Cholesterol: 123 mg/dL — ABNORMAL HIGH (ref 0–99)
NonHDL: 141.63
Total CHOL/HDL Ratio: 3
Triglycerides: 94 mg/dL (ref 0.0–149.0)
VLDL: 18.8 mg/dL (ref 0.0–40.0)

## 2019-11-27 LAB — HEMOGLOBIN A1C: Hgb A1c MFr Bld: 7.2 % — ABNORMAL HIGH (ref 4.6–6.5)

## 2019-11-27 NOTE — Progress Notes (Addendum)
Subjective:  Patient ID: Sandra Curtis, female    DOB: [birth year]  Age: 64 y.o. MRN: [id number]  CC: Follow-up (F/U DM doesn't check blood sugar at home pt states shes been dizzy eye exam yesterday, mammogram Dec 16th)  HPI Dizziness has resolved  DM:  last hgb A1c of 5.9 No medication at this time Negative urine microalbumin No neuropathy Up to date with eye exam. BP at goal. Elevated LDL  Reviewed past Medical, Social and Family history today.  Outpatient Medications Prior to Visit  Medication Sig Dispense Refill  . cetirizine (ZYRTEC) 10 MG tablet Take 1 tablet (10 mg total) by mouth daily. 30 tablet 1  . cyclobenzaprine (FLEXERIL) 5 MG tablet Take 1 tablet (5 mg total) by mouth at bedtime. 7 tablet 0  . meclizine (ANTIVERT) 12.5 MG tablet Take 1 tablet (12.5 mg total) by mouth 2 (two) times daily as needed for dizziness (do not use for more than 3days). 6 tablet 0  . Multiple Vitamin (MULTIVITAMIN) tablet Take 1 tablet by mouth daily.    . naproxen (NAPROSYN) 500 MG tablet Take 1 tablet (500 mg total) by mouth 2 (two) times daily with a meal. 14 tablet 0  . polycarbophil (FIBERCON) 625 MG tablet Take 625 mg by mouth daily.    . Probiotic Product ([place]) CAPS Take 1 capsule by mouth daily.    . Biotin 5000 MCG CAPS Take by mouth.    Marland Kitchen albuterol (PROVENTIL HFA;VENTOLIN HFA) 108 (90 Base) MCG/ACT inhaler Inhale 1-2 puffs into the lungs every 6 (six) hours as needed. (Patient not taking: Reported on 11/27/2019) 1 Inhaler 0  . fluticasone-salmeterol (ADVAIR HFA) 115-21 MCG/ACT inhaler Inhale 2 puffs into the lungs 2 (two) times daily. Rinse mouth after each use (Patient not taking: Reported on 11/27/2019) 1 Inhaler 1   No facility-administered medications prior to visit.    ROS See HPI  Objective:  BP 118/78   Pulse 70   Temp (!) 97.4 F (36.3 C) (Tympanic)   Ht 4\' 11"  (1.499 m)   Wt 136 lb 9.6 oz (62 kg)   SpO2 100%   BMI 27.59 kg/m   BP  Readings from Last 3 Encounters:  11/27/19 118/78  12/12/18 (!) 140/58  10/30/18 (!) 142/69   Wt Readings from Last 3 Encounters:  11/27/19 136 lb 9.6 oz (62 kg)  12/12/18 141 lb 3.2 oz (64 kg)  10/30/18 145 lb (65.8 kg)    Physical Exam Vitals reviewed.  Neck:     Thyroid: No thyroid mass, thyromegaly or thyroid tenderness.  Cardiovascular:     Rate and Rhythm: Normal rate and regular rhythm.     Pulses: Normal pulses.     Heart sounds: Normal heart sounds.  Pulmonary:     Effort: Pulmonary effort is normal.     Breath sounds: Normal breath sounds.  Musculoskeletal:     Cervical back: Normal range of motion and neck supple.     Right lower leg: No edema.     Left lower leg: No edema.  Lymphadenopathy:     Cervical: No cervical adenopathy.  Skin:    Comments: Normal DM foot exam  Neurological:     Mental Status: She is alert and oriented to person, place, and time.  Psychiatric:        Mood and Affect: Mood normal.        Behavior: Behavior normal.        Thought Content: Thought content normal.  Lab Results  Component Value Date   WBC 5.9 11/27/2019   HGB 11.3 (L) 11/27/2019   HCT 34.2 (L) 11/27/2019   PLT 259.0 11/27/2019   GLUCOSE 100 (H) 11/27/2019   CHOL 200 11/27/2019   TRIG 94.0 11/27/2019   HDL 58.40 11/27/2019   LDLCALC 123 (H) 11/27/2019   ALT 12 11/27/2019   AST 23 11/27/2019   NA 140 11/27/2019   K 5.3 No hemolysis seen (H) 11/27/2019   CL 103 11/27/2019   CREATININE 0.90 11/27/2019   BUN 19 11/27/2019   CO2 32 11/27/2019   TSH 0.62 11/27/2019   HGBA1C 7.2 (H) 11/27/2019   MICROALBUR 0.9 11/27/2019    Assessment & Plan:  This visit occurred during the SARS-CoV-2 public health emergency.  Safety protocols were in place, including screening questions prior to the visit, additional usage of staff PPE, and extensive cleaning of exam room while observing appropriate contact time as indicated for disinfecting solutions.   Sandra Curtis was seen  today for follow-up.  Diagnoses and all orders for this visit:  Type 2 diabetes mellitus without complication, without long-term current use of insulin (HCC) -     Hemoglobin A1c -     Hepatic function panel -     Basic metabolic panel -     Microalbumin / creatinine urine ratio -     metFORMIN (GLUCOPHAGE) 500 MG tablet; Take 0.5 tablets (250 mg total) by mouth 2 (two) times daily with a meal.  Encounter for lipid screening for cardiovascular disease -     Lipid panel  Anemia, unspecified type -     TSH -     CBC w/Diff   I have discontinued Sandra Curtis's albuterol and fluticasone-salmeterol. I am also having her start on metFORMIN. Additionally, I am having her maintain her [facility], polycarbophil, multivitamin, Biotin, cetirizine, meclizine, naproxen, and cyclobenzaprine.  Meds ordered this encounter  Medications  . metFORMIN (GLUCOPHAGE) 500 MG tablet    Sig: Take 0.5 tablets (250 mg total) by mouth 2 (two) times daily with a meal.    Dispense:  90 tablet    Refill:  1    Order Specific Question:   Supervising Provider    Answer:   Overton Mam [[id number]]    Problem List Items Addressed This Visit      Endocrine   Type 2 diabetes mellitus without complication, without long-term current use of insulin (HCC) - Primary    Worsening glucose control hgbA1c 5.9 to 7.2 Start metformin 250mg  BID Elevated LDL BP at goal. Negative urine microalbumin Advised about need for DASH diet F/up in 82months      Relevant Medications   metFORMIN (GLUCOPHAGE) 500 MG tablet   Other Relevant Orders   Hemoglobin A1c (Completed)   Hepatic function panel (Completed)   Basic metabolic panel (Completed)   Microalbumin / creatinine urine ratio (Completed)     Other   Anemia   Relevant Orders   TSH (Completed)   CBC w/Diff (Completed)    Other Visit Diagnoses    Encounter for lipid screening for cardiovascular disease       Relevant Orders   Lipid panel  (Completed)      Follow-up: Return in about 6 months (around 05/26/2020) for CPE (fasting, F2F, repeat hgbA1c, lipid panel and bmp).  05/28/2020, NP

## 2019-11-27 NOTE — Patient Instructions (Addendum)
Normal thyroid, liver and renal function. Normal urine microalbumin. Mild elevation in potassium level. It is important maintain adequate oral hydration, avoid sodas and energy drinks. Abnormal lipid panel with elevated ldl: maintain DASH diet. HgbA1c indicates worsening glucose control with increase in hgbA1c 5.9 to 7.2. Start metformin. New rx sent. F/up in 10months  We will obtain eye exam report.

## 2019-11-28 ENCOUNTER — Encounter: Payer: Self-pay | Admitting: Nurse Practitioner

## 2019-11-28 MED ORDER — METFORMIN HCL 500 MG PO TABS: 250.0000 mg | ORAL_TABLET | Freq: Two times a day (BID) | ORAL | 1 refills | Status: DC

## 2019-11-28 NOTE — Assessment & Plan Note (Addendum)
Worsening glucose control hgbA1c 5.9 to 7.2 Start metformin 250mg  BID Elevated LDL BP at goal. Negative urine microalbumin Advised about need for DASH diet F/up in 74months

## 2019-11-28 NOTE — Addendum Note (Signed)
Addended by: Alysia Penna L on: 11/28/2019 12:30 PM   Modules accepted: Orders

## 2019-11-29 ENCOUNTER — Encounter: Payer: Self-pay | Admitting: Nurse Practitioner

## 2019-12-02 ENCOUNTER — Encounter: Payer: Self-pay | Admitting: Nurse Practitioner

## 2019-12-02 DIAGNOSIS — K219 Gastro-esophageal reflux disease without esophagitis: Secondary | ICD-10-CM

## 2019-12-02 MED ORDER — ESOMEPRAZOLE MAGNESIUM 40 MG PO CPDR: 40.0000 mg | DELAYED_RELEASE_CAPSULE | Freq: Every day | ORAL | 0 refills | Status: DC

## 2019-12-04 ENCOUNTER — Telehealth: Payer: Self-pay | Admitting: Nurse Practitioner

## 2019-12-04 DIAGNOSIS — R21 Rash and other nonspecific skin eruption: Secondary | ICD-10-CM

## 2019-12-04 NOTE — Telephone Encounter (Signed)
LVM for the pt to back, need more information to start PA for Nexium.

## 2019-12-05 ENCOUNTER — Telehealth: Payer: Self-pay | Admitting: Nurse Practitioner

## 2019-12-05 NOTE — Telephone Encounter (Signed)
Pt returning [place] cal, please call back at your convienence

## 2019-12-05 NOTE — Telephone Encounter (Signed)
Left message to call back  

## 2019-12-05 NOTE — Telephone Encounter (Signed)
Yes ok to take zyrtec as needed with metformin

## 2019-12-05 NOTE — Telephone Encounter (Signed)
Pt was wondering if it is okey to take metformin and zyrtec together when she gets hives sometime.   Dr. Salena Saner please advise in absence of Nche today and tomorrow.

## 2019-12-09 ENCOUNTER — Encounter: Payer: Self-pay | Admitting: Nurse Practitioner

## 2019-12-09 NOTE — Telephone Encounter (Signed)
LVM for the pt to call back.

## 2019-12-10 NOTE — Telephone Encounter (Signed)
PA for Esomeprazole Mag started on [employer] site.   Unable to get it done through cover my med.    ID [id number]

## 2019-12-10 NOTE — Telephone Encounter (Signed)
Prior Auth (EOC) ID: [id number]

## 2019-12-11 NOTE — Telephone Encounter (Signed)
PA denied and no alternative listed.  Claris Gower please advise

## 2019-12-12 NOTE — Telephone Encounter (Signed)
Please inform patient. She can get medication OTC or go back to omeprazole

## 2019-12-12 NOTE — Telephone Encounter (Signed)
LVM for the pt to call back.

## 2019-12-12 NOTE — Telephone Encounter (Signed)
Pt stated she wants to go back to omeprazole and she was wondering if she can get refill for zyrtec--taking for hives PRN.   Please advise, Please send rx to [facility].

## 2019-12-13 MED ORDER — CETIRIZINE HCL 10 MG PO TABS: 10.0000 mg | ORAL_TABLET | Freq: Every day | ORAL | 1 refills | Status: DC

## 2019-12-13 NOTE — Telephone Encounter (Signed)
Refill for Zyrtec sent to [facility] per pt request.  Left voicemail letting patient know to pick it up on mobile per DPR.

## 2019-12-13 NOTE — Telephone Encounter (Signed)
Ok to send zyrtec

## 2019-12-16 ENCOUNTER — Encounter: Payer: Self-pay | Admitting: Nurse Practitioner

## 2019-12-16 NOTE — Progress Notes (Signed)
Abstracted result and sent to scan  

## 2020-01-07 ENCOUNTER — Encounter: Payer: Self-pay | Admitting: Nurse Practitioner

## 2020-01-07 DIAGNOSIS — M542 Cervicalgia: Secondary | ICD-10-CM

## 2020-01-07 MED ORDER — NAPROXEN 500 MG PO TABS: 500.0000 mg | ORAL_TABLET | Freq: Two times a day (BID) | ORAL | 0 refills | Status: DC

## 2020-02-24 ENCOUNTER — Encounter: Payer: Self-pay | Admitting: Nurse Practitioner

## 2020-02-24 DIAGNOSIS — M542 Cervicalgia: Secondary | ICD-10-CM

## 2020-02-25 ENCOUNTER — Encounter: Payer: Self-pay | Admitting: Nurse Practitioner

## 2020-02-25 MED ORDER — NAPROXEN 500 MG PO TABS: 500.0000 mg | ORAL_TABLET | Freq: Two times a day (BID) | ORAL | 0 refills | Status: DC

## 2020-04-06 ENCOUNTER — Encounter: Payer: Self-pay | Admitting: Nurse Practitioner

## 2020-04-06 DIAGNOSIS — M542 Cervicalgia: Secondary | ICD-10-CM

## 2020-04-06 MED ORDER — NAPROXEN 500 MG PO TABS: 500.0000 mg | ORAL_TABLET | Freq: Two times a day (BID) | ORAL | 1 refills | Status: DC

## 2020-05-12 ENCOUNTER — Other Ambulatory Visit: Payer: Self-pay

## 2020-05-13 ENCOUNTER — Other Ambulatory Visit: Payer: Self-pay

## 2020-05-13 ENCOUNTER — Encounter: Payer: Self-pay | Admitting: Nurse Practitioner

## 2020-05-13 ENCOUNTER — Telehealth (INDEPENDENT_AMBULATORY_CARE_PROVIDER_SITE_OTHER): Payer: BC Managed Care – PPO | Admitting: Nurse Practitioner

## 2020-05-13 VITALS — Ht 59.0 in | Wt 137.0 lb

## 2020-05-13 DIAGNOSIS — K2100 Gastro-esophageal reflux disease with esophagitis, without bleeding: Secondary | ICD-10-CM

## 2020-05-13 DIAGNOSIS — G8929 Other chronic pain: Secondary | ICD-10-CM

## 2020-05-13 DIAGNOSIS — R05 Cough: Secondary | ICD-10-CM

## 2020-05-13 DIAGNOSIS — M545 Low back pain: Secondary | ICD-10-CM

## 2020-05-13 DIAGNOSIS — M5137 Other intervertebral disc degeneration, lumbosacral region: Secondary | ICD-10-CM

## 2020-05-13 DIAGNOSIS — R053 Chronic cough: Secondary | ICD-10-CM

## 2020-05-13 MED ORDER — PANTOPRAZOLE SODIUM 40 MG PO TBEC: 40.0000 mg | DELAYED_RELEASE_TABLET | Freq: Every day | ORAL | 5 refills | Status: DC

## 2020-05-13 MED ORDER — TRAMADOL-ACETAMINOPHEN 37.5-325 MG PO TABS: 1.0000 | ORAL_TABLET | Freq: Three times a day (TID) | ORAL | 0 refills | Status: AC | PRN

## 2020-05-13 MED ORDER — ALBUTEROL SULFATE HFA 108 (90 BASE) MCG/ACT IN AERS: 1.0000 | INHALATION_SPRAY | Freq: Four times a day (QID) | RESPIRATORY_TRACT | 0 refills | Status: DC | PRN

## 2020-05-13 NOTE — Progress Notes (Signed)
Virtual Visit via Video Note  I connected with@ on 05/13/20 at  9:30 AM EDT by a video enabled telemedicine application and verified that I am speaking with the correct person using two identifiers.  Location: Patient:Home Provider: Office Participants: patient and provider  I discussed the limitations of evaluation and management by telemedicine and the availability of in person appointments. I also discussed with the patient that there may be a patient responsible charge related to this service. The patient expressed understanding and agreed to proceed.  CC:Pt asking for chest x-ray or maybe covid test//pt said coughing for couple of months-persistent cough and dry//pt is taking Robutussin cough syrup and gel tab but not helping//wakes her up//headache last couple of days  History of Present Illness: Cough This is a chronic problem. The problem has been gradually worsening. The problem occurs constantly. The cough is non-productive. Associated symptoms include heartburn, nasal congestion and postnasal drip. Pertinent negatives include no chest pain, chills, fever, headaches, rhinorrhea, sore throat, shortness of breath, weight loss or wheezing. The symptoms are aggravated by lying down. She has tried OTC cough suppressant for the symptoms. The treatment provided no relief. Her past medical history is significant for environmental allergies. There is no history of asthma or bronchitis.  Gastroesophageal Reflux She complains of choking, coughing, globus sensation, heartburn and a hoarse voice. She reports no abdominal pain, no belching, no chest pain, no early satiety, no nausea, no sore throat, no stridor, no tooth decay, no water brash or no wheezing. This is a chronic problem. The current episode started more than 1 year ago. The problem occurs constantly. The problem has been waxing and waning. The heartburn does not wake her from sleep. The heartburn does not limit her activity. The heartburn  doesn't change with position. The symptoms are aggravated by certain foods and stress. Pertinent negatives include no fatigue, melena, orthopnea or weight loss. Risk factors include NSAIDs, lack of exercise, caffeine use and obesity. She has tried an antacid and a PPI for the symptoms. The treatment provided mild relief. Past procedures include an EGD and H. pylori antibody titer. Past invasive treatments do not include gastroplasty, gastroplication or reflux surgery.  last EGD 2010: gastritis Last colonoscopy 2015: normal No improvement with omeprazole Former smoker.  Chronic use of ibuprofen and naproxen due to chronic back pain secondary to DDD.  Cough since 11/2019, worsening Same all day Clear sputum, no SOB or CP or fever. No weight loss No night sweats COVID vaccine johnson 01/2020 GERD symptoms Allergic   Observations/Objective: Alert and oriented Limited exam due to video appt  Assessment and Plan: Geoffrey was seen today for cough.  Diagnoses and all orders for this visit:  Chronic cough -     pantoprazole (PROTONIX) 40 MG tablet; Take 1 tablet (40 mg total) by mouth daily. -     albuterol (VENTOLIN HFA) 108 (90 Base) MCG/ACT inhaler; Inhale 1-2 puffs into the lungs every 6 (six) hours as needed.  DDD (degenerative disc disease), lumbosacral  Gastroesophageal reflux disease with esophagitis without hemorrhage -     pantoprazole (PROTONIX) 40 MG tablet; Take 1 tablet (40 mg total) by mouth daily. -     albuterol (VENTOLIN HFA) 108 (90 Base) MCG/ACT inhaler; Inhale 1-2 puffs into the lungs every 6 (six) hours as needed.  Chronic bilateral low back pain without sciatica -     traMADol-acetaminophen (ULTRACET) 37.5-325 MG tablet; Take 1 tablet by mouth every 8 (eight) hours as needed for up to 5  days.   Follow Up Instructions: Start above medication as prescribed Stop use of any NSAIDs. Use albuterol prn Consider PFT and adding sucralfate or ref GI? F/up in 2weeks,  needs CXR?   I discussed the assessment and treatment plan with the patient. The patient was provided an opportunity to ask questions and all were answered. The patient agreed with the plan and demonstrated an understanding of the instructions.   The patient was advised to call back or seek an in-person evaluation if the symptoms worsen or if the condition fails to improve as anticipated.  Alysia Penna, NP

## 2020-05-14 ENCOUNTER — Ambulatory Visit: Payer: Self-pay | Admitting: *Deleted

## 2020-05-14 ENCOUNTER — Encounter: Payer: Self-pay | Admitting: Nurse Practitioner

## 2020-05-14 NOTE — Telephone Encounter (Signed)
Summary: Clinical Advice   Patient has cough and headache and would like recommendation on if she should quarantine. Please advise      Patient is calling to report symptoms and ask questions about COVID. Patient states she has had a chronic cough that has gotten worse over the last couple weeks. She has a headache today. Patient answered screening questions about COVID symptoms- she has had vaccine. Patient states she has had possible exposure at work- her QA person tested + COVID- she wears mask at work and takes precautions. Patient had visit with PCP yesterday and her cough and body aches were addressed. Patient advised that a fully vaccinated adult has a low likelihood of contracting COVID without prolonged exposure to COVID virus. She is certainly within her rights to get tested if she needs/wants to be tested.Offered appointment/information about testing given.Reviewed isolation for COVID or any illness with patient- advised she contact her human resource person for work. She is going to think about it. Patient advised if her symptoms do not improve- or she develops new symptoms to call PCP. Answer Assessment - Initial Assessment Questions 1. COVID-19 DIAGNOSIS: "Who made your Coronavirus (COVID-19) diagnosis?" "Was it confirmed by a positive lab test?" If not diagnosed by a HCP, ask "Are there lots of cases (community spread) where you live?" (See public health department website, if unsure)     Community spread, possible contact at work 2. COVID-19 EXPOSURE: "Was there any known exposure to COVID before the symptoms began?" CDC Definition of close contact: within 6 feet (2 meters) for a total of 15 minutes or more over a 24-hour period.      No contact known- wears mask 3. ONSET: "When did the COVID-19 symptoms start?"      2 weeks ago- cough, headache- started this week 4. WORST SYMPTOM: "What is your worst symptom?" (e.g., cough, fever, shortness of breath, muscle aches)     headache 5.  COUGH: "Do you have a cough?" If Yes, ask: "How bad is the cough?"       Cough all day 6. FEVER: "Do you have a fever?" If Yes, ask: "What is your temperature, how was it measured, and when did it start?"     No fever- felt warm 7. RESPIRATORY STATUS: "Describe your breathing?" (e.g., shortness of breath, wheezing, unable to speak)      Difficult when coughing - otherwise exertion makes her out of breath 8. BETTER-SAME-WORSE: "Are you getting better, staying the same or getting worse compared to yesterday?"  If getting worse, ask, "In what way?"     Patient had visit with PCP yesterday- medication seems to be helping 9. HIGH RISK DISEASE: "Do you have any chronic medical problems?" (e.g., asthma, heart or lung disease, weak immune system, obesity, etc.)     Age, bronchitis hx 10. PREGNANCY: "Is there any chance you are pregnant?" "When was your last menstrual period?"       n/a 11. OTHER SYMPTOMS: "Do you have any other symptoms?"  (e.g., chills, fatigue, headache, loss of smell or taste, muscle pain, sore throat; new loss of smell or taste especially support the diagnosis of COVID-19)       Headache, muscle pain  Protocols used: CORONAVIRUS (COVID-19) DIAGNOSED OR SUSPECTED-A-AH

## 2020-05-15 ENCOUNTER — Other Ambulatory Visit: Payer: Self-pay

## 2020-05-15 ENCOUNTER — Ambulatory Visit (INDEPENDENT_AMBULATORY_CARE_PROVIDER_SITE_OTHER): Payer: BC Managed Care – PPO

## 2020-05-15 ENCOUNTER — Ambulatory Visit (HOSPITAL_COMMUNITY)
Admission: EM | Admit: 2020-05-15 | Discharge: 2020-05-15 | Disposition: A | Discharge status: Home / Self Care | Payer: BC Managed Care – PPO | Attending: Family Medicine | Admitting: Family Medicine

## 2020-05-15 ENCOUNTER — Encounter (HOSPITAL_COMMUNITY): Payer: Self-pay

## 2020-05-15 DIAGNOSIS — R519 Headache, unspecified: Secondary | ICD-10-CM | POA: Diagnosis not present

## 2020-05-15 DIAGNOSIS — R5383 Other fatigue: Secondary | ICD-10-CM

## 2020-05-15 DIAGNOSIS — R05 Cough: Secondary | ICD-10-CM

## 2020-05-15 DIAGNOSIS — Z1152 Encounter for screening for COVID-19: Secondary | ICD-10-CM | POA: Diagnosis present

## 2020-05-15 DIAGNOSIS — R059 Cough, unspecified: Secondary | ICD-10-CM

## 2020-05-15 DIAGNOSIS — R0602 Shortness of breath: Secondary | ICD-10-CM | POA: Diagnosis not present

## 2020-05-15 LAB — SARS CORONAVIRUS 2 (TAT 6-24 HRS): SARS Coronavirus 2: NEGATIVE

## 2020-05-15 MED ORDER — BENZONATATE 100 MG PO CAPS: 100.0000 mg | ORAL_CAPSULE | Freq: Three times a day (TID) | ORAL | 0 refills | Status: DC

## 2020-05-15 MED ORDER — AMOXICILLIN-POT CLAVULANATE 875-125 MG PO TABS
1.0000 | ORAL_TABLET | Freq: Two times a day (BID) | ORAL | 0 refills | Status: AC
Start: 2020-05-15 — End: 2020-05-25

## 2020-05-15 MED ORDER — PREDNISONE 10 MG (21) PO TBPK: ORAL_TABLET | Freq: Every day | ORAL | 0 refills | Status: AC

## 2020-05-15 NOTE — ED Provider Notes (Addendum)
[facility]   [id number] 05/15/20 Arrival Time: 1242   CC: COVID symptoms  SUBJECTIVE: History from: patient.  Sandra Curtis is a 64 y.o. female who presents with dry cough and headache intermittently for the last month. Reports that she has experienced chills, SOB, worsening headache over the last 3 days. Denies sick exposure to COVID, flu or strep. Denies recent travel. Has negative history of Covid. Has completed Covid vaccines. Has not taken OTC medications for this. Had a video visit with her primary care yesterday and was given albuterol inhaler and tramadol for head pain. There are no aggravating or alleviating factors. Denies previous symptoms in the past. Denies fever, sinus pain, rhinorrhea, sore throat, wheezing, chest pain, nausea, changes in bowel or bladder habits.    ROS: As per HPI.  All other pertinent ROS negative.     Past Medical History:  Diagnosis Date  . Allergic rhinitis   . Allergy   . Depression   . Esophageal stricture   . GERD (gastroesophageal reflux disease)   . Inflammatory polyps of colon (HCC) 2010  . Systolic murmur   . UTI (urinary tract infection)    History reviewed. No pertinent surgical history. Allergies  Allergen Reactions  . Iodine Hives    Hives    No current facility-administered medications on file prior to encounter.   Current Outpatient Medications on File Prior to Encounter  Medication Sig Dispense Refill  . albuterol (VENTOLIN HFA) 108 (90 Base) MCG/ACT inhaler Inhale 1-2 puffs into the lungs every 6 (six) hours as needed. 6.7 g 0  . Biotin 5000 MCG CAPS Take by mouth.    . cetirizine (ZYRTEC) 10 MG tablet Take 1 tablet (10 mg total) by mouth daily. 30 tablet 1  . cyclobenzaprine (FLEXERIL) 5 MG tablet Take 1 tablet (5 mg total) by mouth at bedtime. 7 tablet 0  . Multiple Vitamin (MULTIVITAMIN) tablet Take 1 tablet by mouth daily.    . pantoprazole (PROTONIX) 40 MG tablet Take 1 tablet (40 mg total) by mouth  daily. 30 tablet 5  . polycarbophil (FIBERCON) 625 MG tablet Take 625 mg by mouth daily.    . Probiotic Product (PHILLIPS COLON HEALTH) CAPS Take 1 capsule by mouth daily.    . traMADol-acetaminophen (ULTRACET) 37.5-325 MG tablet Take 1 tablet by mouth every 8 (eight) hours as needed for up to 5 days. 15 tablet 0   Social History   Socioeconomic History  . Marital status: Single    Spouse name: Not on file  . Number of children: 1  . Years of education: Not on file  . Highest education level: Not on file  Occupational History  . Occupation: Biomedical engineer: paperworks  Tobacco Use  . Smoking status: Former Smoker    Packs/day: 0.50    Years: 35.00    Pack years: 17.50    Quit date: 04/12/2018    Years since quitting: 2.0  . Smokeless tobacco: Never Used  Vaping Use  . Vaping Use: Never used  Substance and Sexual Activity  . Alcohol use: Yes    Comment: occasionally  . Drug use: No  . Sexual activity: Not Currently  Other Topics Concern  . Not on file  Social History Narrative  . Not on file   Social Determinants of Health   Financial Resource Strain:   . Difficulty of Paying Living Expenses:   Food Insecurity:   . Worried About Programme researcher, broadcasting/film/video in the Last Year:   .  Ran Out of Food in the Last Year:   Transportation Needs:   . Freight forwarder (Medical):   Marland Kitchen Lack of Transportation (Non-Medical):   Physical Activity:   . Days of Exercise per Week:   . Minutes of Exercise per Session:   Stress:   . Feeling of Stress :   Social Connections:   . Frequency of Communication with Friends and Family:   . Frequency of Social Gatherings with Friends and Family:   . Attends Religious Services:   . Active Member of Clubs or Organizations:   . Attends Banker Meetings:   Marland Kitchen Marital Status:   Intimate Partner Violence:   . Fear of Current or Ex-Partner:   . Emotionally Abused:   Marland Kitchen Physically Abused:   . Sexually Abused:    Family History    Problem Relation Age of Onset  . Arthritis Mother   . Thyroid disease Mother   . Heart disease Father   . Hypertension Father   . Diabetes Father   . Arthritis Sister   . Colon cancer Neg Hx   . Stomach cancer Neg Hx     OBJECTIVE:  Vitals:   05/15/20 1407  BP: (!) 119/54  Pulse: 75  Resp: 18  Temp: 98.2 F (36.8 C)  TempSrc: Oral  SpO2: 97%     General appearance: alert; appears fatigued, but nontoxic; speaking in full sentences and tolerating own secretions HEENT: NCAT; Ears: EACs clear, TMs pearly gray; Eyes: PERRL.  EOM grossly intact. Sinuses: nontender; Nose: nares patent without rhinorrhea, Throat: oropharynx clear, tonsils non erythematous or enlarged, uvula midline  Neck: supple without LAD Lungs: unlabored respirations, symmetrical air entry; cough: moderate; no respiratory distress; diffuse wheezing throughout bilateral lung fields, diminished lung sounds at bilateral bases Heart: regular rate and rhythm.  Radial pulses 2+ symmetrical bilaterally Skin: warm and dry Psychological: alert and cooperative; normal mood and affect  LABS:  No results found for this or any previous visit (from the past 24 hour(s)).   ASSESSMENT & PLAN:  1. Cough   2. SOB (shortness of breath)   3. Other fatigue   4. Nonintractable headache, unspecified chronicity pattern, unspecified headache type   5. Encounter for screening for COVID-19     Meds ordered this encounter  Medications  . predniSONE (STERAPRED UNI-PAK 21 TAB) 10 MG (21) TBPK tablet    Sig: Take by mouth daily for 6 days. Take 6 tablets on day 1, 5 tablets on day 2, 4 tablets on day 3, 3 tablets on day 4, 2 tablets on day 5, 1 tablet on day 6    Dispense:  21 tablet    Refill:  0    Order Specific Question:   Supervising Provider    Answer:   Merrilee Jansky [id number]  . benzonatate (TESSALON) 100 MG capsule    Sig: Take 1 capsule (100 mg total) by mouth every 8 (eight) hours.    Dispense:  21 capsule     Refill:  0    Order Specific Question:   Supervising Provider    Answer:   Merrilee Jansky [id number]  . amoxicillin-clavulanate (AUGMENTIN) 875-125 MG tablet    Sig: Take 1 tablet by mouth 2 (two) times daily for 10 days.    Dispense:  20 tablet    Refill:  0    Order Specific Question:   Supervising Provider    Answer:   Merrilee Jansky [id number]    Given length  of cough and presence of infiltrate to R lung, highly suspicious for pneumonia Will treat with Augmentin Prescribed prednisone taper Prescribed tessalon perles Continue albuterol at home COVID testing ordered.  It will take between 1-2 days for test results.  Someone will contact you regarding abnormal results.    Patient should remain in quarantine until they have received Covid results.  If negative you may resume normal activities (go back to work/school) while practicing hand hygiene, social distance, and mask wearing.  If positive, patient should remain in quarantine for 10 days from symptom onset AND greater than 72 hours after symptoms resolution (absence of fever without the use of fever-reducing medication and improvement in respiratory symptoms), whichever is longer Get plenty of rest and push fluids Use OTC zyrtec for nasal congestion, runny nose, and/or sore throat Use OTC flonase for nasal congestion and runny nose Use medications daily for symptom relief Use OTC medications like ibuprofen or tylenol as needed fever or pain Call or go to the ED if you have any new or worsening symptoms such as fever, worsening cough, shortness of breath, chest tightness, chest pain, turning blue, changes in mental status.  Reviewed expectations re: course of current medical issues. Questions answered. Outlined signs and symptoms indicating need for more acute intervention. Patient verbalized understanding. After Visit Summary given.         Moshe Cipro, NP 05/15/20 1556    Moshe Cipro, NP 05/15/20  1559

## 2020-05-15 NOTE — Discharge Instructions (Signed)
Your COVID test is pending.  You should self quarantine until the test result is back.    Take Tylenol as needed for fever or discomfort.  Rest and keep yourself hydrated.    Go to the emergency department if you develop acute worsening symptoms.    You have bronchitis  Your chest xray was negative today  I have sent in a prednisone taper for you to take for 6 days. 6 tablets on day one, 5 tablets on day two, 4 tablets on day three, 3 tablets on day four, 2 tablets on day five, and 1 tablet on day six.  Use the albuterol inhaler every 4 hours for the next 24 hours. You may skip if you are asleep.  Follow up with this office or with primary care if you are not improving over the next few days  Follow up with the ER for high fever, trouble swallowing, trouble breathing, other concerning symptoms

## 2020-05-15 NOTE — ED Triage Notes (Addendum)
Pt presents with cough and headache x 1 month, worse since Wednesday. Reports he PCP sent a prescription for tramadol and albutein inhaler  yesterday. States the tramadol did not help with the headache.

## 2020-05-22 ENCOUNTER — Encounter: Payer: Self-pay | Admitting: Nurse Practitioner

## 2020-05-22 ENCOUNTER — Ambulatory Visit: Payer: BC Managed Care – PPO | Admitting: Nurse Practitioner

## 2020-05-25 ENCOUNTER — Other Ambulatory Visit: Payer: Self-pay

## 2020-05-25 ENCOUNTER — Ambulatory Visit: Payer: BC Managed Care – PPO | Admitting: Nurse Practitioner

## 2020-05-26 ENCOUNTER — Encounter: Payer: Self-pay | Admitting: Nurse Practitioner

## 2020-05-26 ENCOUNTER — Ambulatory Visit (INDEPENDENT_AMBULATORY_CARE_PROVIDER_SITE_OTHER): Payer: BC Managed Care – PPO | Admitting: Nurse Practitioner

## 2020-05-26 VITALS — BP 124/70 | HR 70 | Temp 97.4°F | Ht 59.0 in | Wt 137.2 lb

## 2020-05-26 DIAGNOSIS — R053 Chronic cough: Secondary | ICD-10-CM

## 2020-05-26 DIAGNOSIS — E119 Type 2 diabetes mellitus without complications: Secondary | ICD-10-CM

## 2020-05-26 DIAGNOSIS — K2100 Gastro-esophageal reflux disease with esophagitis, without bleeding: Secondary | ICD-10-CM | POA: Diagnosis not present

## 2020-05-26 DIAGNOSIS — R05 Cough: Secondary | ICD-10-CM

## 2020-05-26 DIAGNOSIS — J189 Pneumonia, unspecified organism: Secondary | ICD-10-CM

## 2020-05-26 DIAGNOSIS — M5137 Other intervertebral disc degeneration, lumbosacral region: Secondary | ICD-10-CM | POA: Diagnosis not present

## 2020-05-26 LAB — BASIC METABOLIC PANEL
BUN: 11 mg/dL (ref 6–23)
CO2: 30 mEq/L (ref 19–32)
Calcium: 9.5 mg/dL (ref 8.4–10.5)
Chloride: 103 mEq/L (ref 96–112)
Creatinine, Ser: 1.07 mg/dL (ref 0.40–1.20)
GFR: 62.43 mL/min (ref >60.00)
Glucose, Bld: 84 mg/dL (ref 70–99)
Potassium: 3.7 mEq/L (ref 3.5–5.1)
Sodium: 140 mEq/L (ref 135–145)

## 2020-05-26 LAB — HEMOGLOBIN A1C: Hgb A1c MFr Bld: 6.5 % (ref 4.6–6.5)

## 2020-05-26 MED ORDER — GABAPENTIN 100 MG PO CAPS: ORAL_CAPSULE | ORAL | 1 refills | Status: DC

## 2020-05-26 MED ORDER — AEROCHAMBER PLUS FLO-VU MEDIUM MISC: 1.0000 | Freq: Once | 0 refills | Status: AC

## 2020-05-26 MED ORDER — BENZONATATE 100 MG PO CAPS: 100.0000 mg | ORAL_CAPSULE | Freq: Three times a day (TID) | ORAL | 0 refills | Status: DC

## 2020-05-26 MED ORDER — PANTOPRAZOLE SODIUM 40 MG PO TBEC: 40.0000 mg | DELAYED_RELEASE_TABLET | Freq: Every day | ORAL | 0 refills | Status: DC

## 2020-05-26 MED ORDER — SUCRALFATE 1 GM/10ML PO SUSP: 1.0000 g | Freq: Three times a day (TID) | ORAL | 2 refills | Status: DC

## 2020-05-26 MED ORDER — ACETAMINOPHEN ER 650 MG PO TBCR: 650.0000 mg | EXTENDED_RELEASE_TABLET | Freq: Three times a day (TID) | ORAL | Status: DC | PRN

## 2020-05-26 MED ORDER — DICLOFENAC SODIUM 1 % EX GEL: 2.0000 g | Freq: Four times a day (QID) | CUTANEOUS | 5 refills | Status: DC

## 2020-05-26 NOTE — Assessment & Plan Note (Addendum)
Chronic back pain, ongoing since 2018, worse with prolonged standing and lifting at work. No radiculopathy, no saddle paresthesia, no hx of fall in last 36months. Declined refferal to outpatient PT. Hx of compression fracture: MRI scan 08/2018: shows she has some facet arthropathy 3 mm anterolisthesis at L4-5 with scoliosis and lateral recess narrowing at L5S1. Evaluated by Dr. Ophelia Charter, last OV 11/06/2018 Unable to use NSAID due to severe GERD with esophagitis. Some improvement with tramadol/acetaminophen and flexeril  Change to gabapentin, titrate up to TID if no daytime somnolence.

## 2020-05-26 NOTE — Assessment & Plan Note (Addendum)
She declined to take metformin Reports she has made changes to her diet (low carb and low sugar) HgbA1c improved to 6.5. Repeat in 65months

## 2020-05-26 NOTE — Assessment & Plan Note (Addendum)
Chronic, onset 1year ago, worse in last 2week with pneumonia. Non productive cough, SOB with exertion. Former smoker: tobacco 1-1/2ppd since age 64, quit 80months ago. Has difficulty with using albuterol inhaler. No esophilia per cbc 11/2019  Ordered spirometry and spacer Chronic bronchitis vs emphysema? consider use of combivent vs spiriva? Continue use of albuterol and benzonate prn F/up in 5month

## 2020-05-26 NOTE — Assessment & Plan Note (Signed)
Improved cough and SOB with completion of oral prednisone, augmentin and benzonatate.No fever or night sweats. CXR done 05/15/2020: patchy increased density is noted in the right upper lobe and right base likely representing acute on chronic infiltrate. Repeat CXR in 2weeks

## 2020-05-26 NOTE — Patient Instructions (Addendum)
You are due for mammogram 09/2020. Call breast center and schedule an appt.  Normal BMP and improved hgbA1c to 6.5. You are still considered diabetes but this is controlled with diet. No medication at this time. Continue heart healthy diet  You will be contacted to schedule appt for pulmonary function test.  Return to lab if 2weeks for repeat CXR.  Food Choices for Gastroesophageal Reflux Disease, Adult When you have gastroesophageal reflux disease (GERD), the foods you eat and your eating habits are very important. Choosing the right foods can help ease your discomfort. Think about working with a nutrition specialist (dietitian) to help you make good choices. What are tips for following this plan?  Meals  Choose healthy foods that are low in fat, such as fruits, vegetables, whole grains, low-fat dairy products, and lean meat, fish, and poultry.  Eat small meals often instead of 3 large meals a day. Eat your meals slowly, and in a place where you are relaxed. Avoid bending over or lying down until 2-3 hours after eating.  Avoid eating meals 2-3 hours before bed.  Avoid drinking a lot of liquid with meals.  Cook foods using methods other than frying. Bake, grill, or broil food instead.  Avoid or limit: ? Chocolate. ? Peppermint or spearmint. ? Alcohol. ? Pepper. ? Black and decaffeinated coffee. ? Black and decaffeinated tea. ? Bubbly (carbonated) soft drinks. ? Caffeinated energy drinks and soft drinks.  Limit high-fat foods such as: ? Fatty meat or fried foods. ? Whole milk, cream, butter, or ice cream. ? Nuts and nut butters. ? Pastries, donuts, and sweets made with butter or shortening.  Avoid foods that cause symptoms. These foods may be different for everyone. Common foods that cause symptoms include: ? Tomatoes. ? Oranges, lemons, and limes. ? Peppers. ? Spicy food. ? Onions and garlic. ? Vinegar. Lifestyle  Maintain a healthy weight. Ask your doctor what  weight is healthy for you. If you need to lose weight, work with your doctor to do so safely.  Exercise for at least 30 minutes for 5 or more days each week, or as told by your doctor.  Wear loose-fitting clothes.  Do not smoke. If you need help quitting, ask your doctor.  Sleep with the head of your bed higher than your feet. Use a wedge under the mattress or blocks under the bed frame to raise the head of the bed. Summary  When you have gastroesophageal reflux disease (GERD), food and lifestyle choices are very important in easing your symptoms.  Eat small meals often instead of 3 large meals a day. Eat your meals slowly, and in a place where you are relaxed.  Limit high-fat foods such as fatty meat or fried foods.  Avoid bending over or lying down until 2-3 hours after eating.  Avoid peppermint and spearmint, caffeine, alcohol, and chocolate. This information is not intended to replace advice given to you by your health care provider. Make sure you discuss any questions you have with your health care provider. Document Revised: 01/17/2019 Document Reviewed: 11/01/2016 Elsevier Patient Education  2020 [employer].

## 2020-05-26 NOTE — Progress Notes (Signed)
Subjective:  Patient ID: Sandra Curtis, female    DOB: 08-Nov-1955  Age: 64 y.o. MRN: [id number]  CC: Follow-up (chronic cough, pneumonia, GERD, back pain)  HPI  Community acquired pneumonia of right lung Improved cough and SOB with completion of oral prednisone, augmentin and benzonatate.No fever or night sweats. CXR done 05/15/2020: patchy increased density is noted in the right upper lobe and right base likely representing acute on chronic infiltrate. Repeat CXR in 2weeks  Chronic cough Chronic, onset 1year ago, worse in last 2week with pneumonia. Non productive cough, SOB with exertion. Former smoker: tobacco 1-1/2ppd since age 41, quit 19months ago. Has difficulty with using albuterol inhaler. No esophilia per cbc 11/2019  Ordered spirometry and spacer Chronic bronchitis vs emphysema? consider use of combivent vs spiriva? Continue use of albuterol and benzonate prn F/up in 46month  DDD (degenerative disc disease), lumbosacral Chronic back pain, ongoing since 2018, worse with prolonged standing and lifting at work. No radiculopathy, no saddle paresthesia, no hx of fall in last 72months. Declined refferal to outpatient PT. Hx of compression fracture: MRI scan 08/2018: shows she has some facet arthropathy 3 mm anterolisthesis at L4-5 with scoliosis and lateral recess narrowing at L5S1. Evaluated by Dr. Ophelia Charter, last OV 11/06/2018 Unable to use NSAID due to severe GERD with esophagitis. Some improvement with tramadol/acetaminophen and flexeril  Change to gabapentin, titrate up to TID if no daytime somnolence.  Gastroesophageal reflux disease with esophagitis Worse due to prolong use of NSAIDs for back pain. Advised not to take any OTC NSAIDs Minimal improvement with pantoprazole 40mg  BID Added sucralfate TID and HS F/up in 29month Ref to GI if no improvement  Type 2 diabetes mellitus without complication, without long-term current use of insulin (HCC) She declined to  take metformin Reports she has made changes to her diet (low carb and low sugar)  Repeat HgbA1c today   Reviewed past Medical, Social and Family history today.  Outpatient Medications Prior to Visit  Medication Sig Dispense Refill  . albuterol (VENTOLIN HFA) 108 (90 Base) MCG/ACT inhaler Inhale 1-2 puffs into the lungs every 6 (six) hours as needed. 6.7 g 0  . benzonatate (TESSALON) 100 MG capsule Take 1 capsule (100 mg total) by mouth every 8 (eight) hours. 21 capsule 0  . pantoprazole (PROTONIX) 40 MG tablet Take 1 tablet (40 mg total) by mouth daily. 30 tablet 5  . Biotin 5000 MCG CAPS Take by mouth. (Patient not taking: Reported on 05/26/2020)    . cetirizine (ZYRTEC) 10 MG tablet Take 1 tablet (10 mg total) by mouth daily. (Patient not taking: Reported on 05/26/2020) 30 tablet 1  . cyclobenzaprine (FLEXERIL) 5 MG tablet Take 1 tablet (5 mg total) by mouth at bedtime. (Patient not taking: Reported on 05/26/2020) 7 tablet 0  . Multiple Vitamin (MULTIVITAMIN) tablet Take 1 tablet by mouth daily. (Patient not taking: Reported on 05/26/2020)    . polycarbophil (FIBERCON) 625 MG tablet Take 625 mg by mouth daily. (Patient not taking: Reported on 05/26/2020)    . Probiotic Product (PHILLIPS COLON HEALTH) CAPS Take 1 capsule by mouth daily. (Patient not taking: Reported on 05/26/2020)     No facility-administered medications prior to visit.    ROS See HPI  Objective:  BP 124/70 (BP Location: Left Arm, Patient Position: Sitting, Cuff Size: Normal)   Pulse 70   Temp (!) 97.4 F (36.3 C) (Temporal)   Ht 4\' 11"  (1.499 m)   Wt 137 lb 3.2 oz (62.2 kg)  SpO2 99%   BMI 27.71 kg/m   Physical Exam Vitals reviewed.  Cardiovascular:     Rate and Rhythm: Normal rate and regular rhythm.     Pulses: Normal pulses.     Heart sounds: Normal heart sounds.  Pulmonary:     Effort: Pulmonary effort is normal.     Breath sounds: Normal breath sounds.  Abdominal:     General: Bowel sounds are normal.  There is no distension.     Palpations: Abdomen is soft. There is no mass.     Tenderness: There is no abdominal tenderness. There is no guarding.  Musculoskeletal:     Right lower leg: No edema.     Left lower leg: No edema.  Neurological:     Mental Status: She is alert and oriented to person, place, and time.  Psychiatric:        Mood and Affect: Mood normal.        Behavior: Behavior normal.        Thought Content: Thought content normal.     Assessment & Plan:  This visit occurred during the SARS-CoV-2 public health emergency.  Safety protocols were in place, including screening questions prior to the visit, additional usage of staff PPE, and extensive cleaning of exam room while observing appropriate contact time as indicated for disinfecting solutions.   Sandra Curtis was seen today for follow-up.  Diagnoses and all orders for this visit:  Type 2 diabetes mellitus without complication, without long-term current use of insulin (HCC) -     Hemoglobin A1c -     Basic metabolic panel  Gastroesophageal reflux disease with esophagitis without hemorrhage -     sucralfate (CARAFATE) 1 GM/10ML suspension; Take 10 mLs (1 g total) by mouth 4 (four) times daily -  with meals and at bedtime. -     pantoprazole (PROTONIX) 40 MG tablet; Take 1 tablet (40 mg total) by mouth daily.  Chronic cough -     Spirometry with Graph; Future -     benzonatate (TESSALON) 100 MG capsule; Take 1 capsule (100 mg total) by mouth every 8 (eight) hours. -     Spacer/Aero-Holding Chambers (AEROCHAMBER PLUS FLO-VU MEDIUM) MISC; 1 each by Other route once for 1 dose. -     pantoprazole (PROTONIX) 40 MG tablet; Take 1 tablet (40 mg total) by mouth daily.  DDD (degenerative disc disease), lumbosacral -     gabapentin (NEURONTIN) 100 MG capsule; 1cap at hs x7days, then 1cap BID x 7days, then 1cap TID continuous. -     acetaminophen (TYLENOL 8 HOUR ARTHRITIS PAIN) 650 MG CR tablet; Take 1 tablet (650 mg total) by  mouth every 8 (eight) hours as needed for pain. -     diclofenac Sodium (VOLTAREN) 1 % GEL; Apply 2 g topically 4 (four) times daily.  Community acquired pneumonia of right lung, unspecified part of lung -     DG Chest 2 View; Future    Problem List Items Addressed This Visit      Respiratory   Community acquired pneumonia of right lung    Improved cough and SOB with completion of oral prednisone, augmentin and benzonatate.No fever or night sweats. CXR done 05/15/2020: patchy increased density is noted in the right upper lobe and right base likely representing acute on chronic infiltrate. Repeat CXR in 2weeks      Relevant Medications   benzonatate (TESSALON) 100 MG capsule   Other Relevant Orders   DG Chest 2 View  Digestive   Gastroesophageal reflux disease with esophagitis    Worse due to prolong use of NSAIDs for back pain. Advised not to take any OTC NSAIDs Minimal improvement with pantoprazole 40mg  BID Added sucralfate TID and HS F/up in 65month Ref to GI if no improvement      Relevant Medications   sucralfate (CARAFATE) 1 GM/10ML suspension   pantoprazole (PROTONIX) 40 MG tablet     Endocrine   Type 2 diabetes mellitus without complication, without long-term current use of insulin (HCC) - Primary    She declined to take metformin Reports she has made changes to her diet (low carb and low sugar)  Repeat HgbA1c today      Relevant Orders   Hemoglobin A1c   Basic metabolic panel     Musculoskeletal and Integument   DDD (degenerative disc disease), lumbosacral    Chronic back pain, ongoing since 2018, worse with prolonged standing and lifting at work. No radiculopathy, no saddle paresthesia, no hx of fall in last 20months. Declined refferal to outpatient PT. Hx of compression fracture: MRI scan 08/2018: shows she has some facet arthropathy 3 mm anterolisthesis at L4-5 with scoliosis and lateral recess narrowing at L5S1. Evaluated by Dr. 09/2018, last OV  11/06/2018 Unable to use NSAID due to severe GERD with esophagitis. Some improvement with tramadol/acetaminophen and flexeril  Change to gabapentin, titrate up to TID if no daytime somnolence.      Relevant Medications   gabapentin (NEURONTIN) 100 MG capsule   acetaminophen (TYLENOL 8 HOUR ARTHRITIS PAIN) 650 MG CR tablet   diclofenac Sodium (VOLTAREN) 1 % GEL     Other   Chronic cough    Chronic, onset 1year ago, worse in last 2week with pneumonia. Non productive cough, SOB with exertion. Former smoker: tobacco 1-1/2ppd since age 2, quit 67months ago. Has difficulty with using albuterol inhaler. No esophilia per cbc 11/2019  Ordered spirometry and spacer Chronic bronchitis vs emphysema? consider use of combivent vs spiriva? Continue use of albuterol and benzonate prn F/up in 34month      Relevant Medications   benzonatate (TESSALON) 100 MG capsule   Spacer/Aero-Holding Chambers (AEROCHAMBER PLUS FLO-VU MEDIUM) MISC   pantoprazole (PROTONIX) 40 MG tablet   Other Relevant Orders   Spirometry with Graph      I have spent 2month with this patient regarding history taking, documentation, review of ED notes, labs, radiology, formulating plan and discussing treatment options with patient.  Follow-up: Return in about 4 weeks (around 06/23/2020) for cough, back pain and GERD (F2F, 06/25/2020).  , NP

## 2020-05-26 NOTE — Assessment & Plan Note (Signed)
Worse due to prolong use of NSAIDs for back pain. Advised not to take any OTC NSAIDs Minimal improvement with pantoprazole 40mg  BID Added sucralfate TID and HS F/up in 23month Ref to GI if no improvement

## 2020-06-01 ENCOUNTER — Encounter: Payer: Self-pay | Admitting: Nurse Practitioner

## 2020-06-04 ENCOUNTER — Other Ambulatory Visit: Payer: Self-pay | Admitting: Nurse Practitioner

## 2020-06-04 DIAGNOSIS — R053 Chronic cough: Secondary | ICD-10-CM

## 2020-06-04 DIAGNOSIS — K2100 Gastro-esophageal reflux disease with esophagitis, without bleeding: Secondary | ICD-10-CM

## 2020-06-04 NOTE — Telephone Encounter (Cosign Needed)
Medication refill request for Ventolin inhaler. Refill request denied.  05/26/20 Last OV  05/13/20 Last refill date // 6.7 g // refills - 0  Contacted patient to determine use of rescue inhaler. Patient states she is unable to use inhaler. Patient stated she needs a spacer/aerochamber for inhaler. Forwarding request to PCP.

## 2020-06-05 ENCOUNTER — Ambulatory Visit (INDEPENDENT_AMBULATORY_CARE_PROVIDER_SITE_OTHER): Payer: BC Managed Care – PPO

## 2020-06-05 ENCOUNTER — Encounter: Payer: Self-pay | Admitting: Nurse Practitioner

## 2020-06-05 ENCOUNTER — Other Ambulatory Visit: Payer: Self-pay

## 2020-06-05 ENCOUNTER — Telehealth: Payer: Self-pay

## 2020-06-05 ENCOUNTER — Other Ambulatory Visit: Payer: Self-pay | Admitting: Nurse Practitioner

## 2020-06-05 ENCOUNTER — Other Ambulatory Visit: Payer: BC Managed Care – PPO

## 2020-06-05 DIAGNOSIS — J189 Pneumonia, unspecified organism: Secondary | ICD-10-CM

## 2020-06-05 DIAGNOSIS — R9389 Abnormal findings on diagnostic imaging of other specified body structures: Secondary | ICD-10-CM

## 2020-06-05 NOTE — Telephone Encounter (Signed)
Pt was notified of her chest xray results via My Chart and seen where a CT was ordered.

## 2020-06-05 NOTE — Telephone Encounter (Signed)
Contacted pharmacy to determine status of prescription for spacer. Per pharmacy, they had not filled prescription but would have it ready for pickup in approx. 90 minutes. Patient was contacted and advised of this information.

## 2020-06-05 NOTE — Telephone Encounter (Signed)
Charlotte please advise.  Jane from [facility] radiology called with a STAT report for pt.   Given the persistence of this finding, it may be prudent to consider noncontrast enhanced chest CT for further assessment.

## 2020-06-05 NOTE — Telephone Encounter (Signed)
Thank you I will order CT

## 2020-06-17 ENCOUNTER — Ambulatory Visit
Admission: RE | Admit: 2020-06-17 | Discharge: 2020-06-17 | Disposition: A | Discharge status: Home / Self Care | Payer: BC Managed Care – PPO | Source: Ambulatory Visit | Attending: Nurse Practitioner | Admitting: Nurse Practitioner

## 2020-06-17 ENCOUNTER — Encounter: Payer: Self-pay | Admitting: Nurse Practitioner

## 2020-06-17 DIAGNOSIS — J189 Pneumonia, unspecified organism: Secondary | ICD-10-CM

## 2020-06-17 DIAGNOSIS — R9389 Abnormal findings on diagnostic imaging of other specified body structures: Secondary | ICD-10-CM

## 2020-06-18 ENCOUNTER — Encounter: Payer: Self-pay | Admitting: Nurse Practitioner

## 2020-06-18 ENCOUNTER — Other Ambulatory Visit: Payer: Self-pay | Admitting: Nurse Practitioner

## 2020-06-18 DIAGNOSIS — J8410 Pulmonary fibrosis, unspecified: Secondary | ICD-10-CM

## 2020-06-18 DIAGNOSIS — R053 Chronic cough: Secondary | ICD-10-CM

## 2020-06-19 MED ORDER — BENZONATATE 100 MG PO CAPS: 100.0000 mg | ORAL_CAPSULE | Freq: Three times a day (TID) | ORAL | 0 refills | Status: DC

## 2020-06-22 ENCOUNTER — Encounter: Payer: Self-pay | Admitting: Nurse Practitioner

## 2020-06-25 ENCOUNTER — Other Ambulatory Visit: Payer: Self-pay

## 2020-06-26 ENCOUNTER — Encounter: Payer: Self-pay | Admitting: Nurse Practitioner

## 2020-06-26 ENCOUNTER — Ambulatory Visit (INDEPENDENT_AMBULATORY_CARE_PROVIDER_SITE_OTHER): Payer: BC Managed Care – PPO | Admitting: Nurse Practitioner

## 2020-06-26 ENCOUNTER — Other Ambulatory Visit: Payer: Self-pay | Admitting: Nurse Practitioner

## 2020-06-26 VITALS — BP 114/68 | HR 74 | Temp 97.3°F | Ht 59.0 in | Wt 135.6 lb

## 2020-06-26 DIAGNOSIS — K2100 Gastro-esophageal reflux disease with esophagitis, without bleeding: Secondary | ICD-10-CM | POA: Diagnosis not present

## 2020-06-26 DIAGNOSIS — J84112 Idiopathic pulmonary fibrosis: Secondary | ICD-10-CM | POA: Insufficient documentation

## 2020-06-26 DIAGNOSIS — M5137 Other intervertebral disc degeneration, lumbosacral region: Secondary | ICD-10-CM | POA: Diagnosis not present

## 2020-06-26 DIAGNOSIS — J8410 Pulmonary fibrosis, unspecified: Secondary | ICD-10-CM

## 2020-06-26 DIAGNOSIS — R0600 Dyspnea, unspecified: Secondary | ICD-10-CM

## 2020-06-26 DIAGNOSIS — I351 Nonrheumatic aortic (valve) insufficiency: Secondary | ICD-10-CM

## 2020-06-26 LAB — CBC WITH DIFFERENTIAL/PLATELET
Basophils Absolute: 0.1 10*3/uL (ref 0.0–0.1)
Basophils Relative: 1 % (ref 0.0–3.0)
Eosinophils Absolute: 0.2 10*3/uL (ref 0.0–0.7)
Eosinophils Relative: 4.2 % (ref 0.0–5.0)
HCT: 34.6 % — ABNORMAL LOW (ref 36.0–46.0)
Hemoglobin: 11.2 g/dL — ABNORMAL LOW (ref 12.0–15.0)
Lymphocytes Relative: 36.2 % (ref 12.0–46.0)
Lymphs Abs: 2.1 10*3/uL (ref 0.7–4.0)
MCHC: 32.4 g/dL (ref 30.0–36.0)
MCV: 90.3 fl (ref 78.0–100.0)
Monocytes Absolute: 0.7 10*3/uL (ref 0.1–1.0)
Monocytes Relative: 12.2 % — ABNORMAL HIGH (ref 3.0–12.0)
Neutro Abs: 2.7 10*3/uL (ref 1.4–7.7)
Neutrophils Relative %: 46.4 % (ref 43.0–77.0)
Platelets: 221 10*3/uL (ref 150.0–400.0)
RBC: 3.83 Mil/uL — ABNORMAL LOW (ref 3.87–5.11)
RDW: 13.9 % (ref 11.5–15.5)
WBC: 5.9 10*3/uL (ref 4.0–10.5)

## 2020-06-26 MED ORDER — SPACER/AERO-HOLDING CHAMBERS DEVI: 1.0000 [IU] | Freq: Every day | 0 refills | Status: DC

## 2020-06-26 MED ORDER — BENZONATATE 100 MG PO CAPS: 100.0000 mg | ORAL_CAPSULE | Freq: Three times a day (TID) | ORAL | 0 refills | Status: DC

## 2020-06-26 MED ORDER — PANTOPRAZOLE SODIUM 40 MG PO TBEC: 40.0000 mg | DELAYED_RELEASE_TABLET | Freq: Every day | ORAL | 0 refills | Status: DC

## 2020-06-26 MED ORDER — SUCRALFATE 1 GM/10ML PO SUSP: 1.0000 g | Freq: Three times a day (TID) | ORAL | 2 refills | Status: DC

## 2020-06-26 NOTE — Assessment & Plan Note (Addendum)
Chronic cough and SOB with exertion. Worse cough and SOB if lays on right side. Some relief with benzonatate and albuterol. CXR and CT chest indicates pulmonary fibrosis (R>L) She discontinued tobacco use 67months ago. 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 No known exposure to asbestos. No FHx of sarciodosis. walk in office with no evidence of desaturation: 95-99% on room air  Referral to pulmonologist entered Ordered ANA and RF Ordered echocardiogram to eval aortic valve regurgitation and pul. HTN? Continue use of benzonatate, albuterol and pantoprazole Start sucralfate.

## 2020-06-26 NOTE — Assessment & Plan Note (Deleted)
Improved with benzonatate and albuterol. Worse cough and SOB if lays on right side. CXR and CT chest indicates pulmonary fibrosis (R>L) She discontinued tobacco use 62months ago. Mild improvement in GERD symptomsCompliant with pantoprazole 40mg , but did not start carafate. Work history: [phone], [employer] works [employer] (2002-present). No known exposure to asbestos. No FHx of sarciodosis. [id number] walk in office with no evidence of desaturation: 95-99% on room air  Referral to pulmonologist entered Ordered ANA and RF Ordered echocardiogram to eval aortic valve regurgitation and pul. HTN? Continue use of benzonatate, albuterol and pantoprazole Start sucralfate.

## 2020-06-26 NOTE — Progress Notes (Signed)
Subjective:  Patient ID: Sandra Curtis, female    DOB: [birth year]  Age: 64 y.o. MRN: [id number]  CC: Follow-up (4 week f/u on back pain, cough, and GERD. Pt states her cough has improved some but she noticed she coughs more when laying on her right side. Pt states back pain is the same. Pt states GERD has improved some but she still gets the burning sensation in her throat that comes and goes. )  HPI  Pulmonary fibrosis (HCC) Chronic cough and SOB with exertion. Worse cough and SOB if lays on right side. Some relief with benzonatate and albuterol. CXR and CT chest indicates pulmonary fibrosis (R>L) She discontinued tobacco use 6months ago. 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 No FHx of sarciodosis. walk in office with no evidence of desaturation: 95-99% on room air  Referral to pulmonologist entered Ordered ANA and RF Ordered echocardiogram to eval aortic valve regurgitation and pul. HTN? Continue use of benzonatate, albuterol and pantoprazole Start sucralfate.   Gastroesophageal reflux disease with esophagitis Mild improvement in GERD symptoms Compliant with pantoprazole 40mg , but did not start carafate.  Continue pantoprazole and start sucralfate   Reviewed past Medical, Social and Family history today.  Outpatient Medications Prior to Visit  Medication Sig Dispense Refill   acetaminophen (TYLENOL 8 HOUR ARTHRITIS PAIN) 650 MG CR tablet Take 1 tablet (650 mg total) by mouth every 8 (eight) hours as needed for pain.     albuterol (VENTOLIN HFA) 108 (90 Base) MCG/ACT inhaler Inhale 1-2 puffs into the lungs every 6 (six) hours as needed. 6.7 g 0   diclofenac Sodium (VOLTAREN) 1 % GEL Apply 2 g topically 4 (four) times daily. 150 g 5   gabapentin (NEURONTIN) 100 MG capsule 1cap at hs x7days, then 1cap BID x 7days, then 1cap TID continuous. 90 capsule 1   benzonatate (TESSALON) 100 MG  capsule Take 1 capsule (100 mg total) by mouth every 8 (eight) hours. 90 capsule 0   pantoprazole (PROTONIX) 40 MG tablet Take 1 tablet (40 mg total) by mouth daily. 90 tablet 0   sucralfate (CARAFATE) 1 GM/10ML suspension Take 10 mLs (1 g total) by mouth 4 (four) times daily -  with meals and at bedtime. 420 mL 2   No facility-administered medications prior to visit.    ROS See HPI  Objective:  BP 114/68 (BP Location: Left Arm, Patient Position: Sitting, Cuff Size: Normal)    Pulse 74    Temp (!) 97.3 F (36.3 C) (Temporal)    Ht 4\' 11"  (1.499 m)    Wt 135 lb 9.6 oz (61.5 kg)    SpO2 98%    BMI 27.39 kg/m   Physical Exam Vitals reviewed.  Cardiovascular:     Rate and Rhythm: Normal rate and regular rhythm.     Pulses: Normal pulses.     Heart sounds: Murmur heard.   Pulmonary:     Effort: Pulmonary effort is normal.     Breath sounds: Normal breath sounds.  Musculoskeletal:     Right lower leg: No edema.     Left lower leg: No edema.  Neurological:     Mental Status: She is alert and oriented to person, place, and time.    Assessment & Plan:  This visit occurred during the SARS-CoV-2 public health emergency.  Safety protocols were in place, including screening questions prior to the visit, additional usage of staff PPE, and extensive cleaning of  exam room while observing appropriate contact time as indicated for disinfecting solutions.   [name] was seen today for follow-up.  Diagnoses and all orders for this visit:  Gastroesophageal reflux disease with esophagitis without hemorrhage -     sucralfate (CARAFATE) 1 GM/10ML suspension; Take 10 mLs (1 g total) by mouth 4 (four) times daily -  with meals and at bedtime. -     pantoprazole (PROTONIX) 40 MG tablet; Take 1 tablet (40 mg total) by mouth daily.  DDD (degenerative disc disease), lumbosacral  Pulmonary fibrosis (HCC) -     Antinuclear Antib (ANA) -     Rheumatoid Factor -     CBC with Differential/Platelet -      Spacer/Aero-Holding Chambers DEVI; 1 Units by Does not apply route daily. -     benzonatate (TESSALON) 100 MG capsule; Take 1 capsule (100 mg total) by mouth every 8 (eight) hours. -     ECHOCARDIOGRAM COMPLETE; Future  Aortic valve insufficiency, etiology of cardiac valve disease unspecified -     ECHOCARDIOGRAM COMPLETE; Future  Dyspnea, unspecified type -     ECHOCARDIOGRAM COMPLETE; Future   Problem List Items Addressed This Visit      Respiratory   Pulmonary fibrosis (HCC)    Chronic cough and SOB with exertion. Worse cough and SOB if lays on right side. Some relief with benzonatate and albuterol. CXR and CT chest indicates pulmonary fibrosis (R>L) She discontinued tobacco use 30months ago. 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 No known exposure to asbestos. No FHx of sarciodosis. walk in office with no evidence of desaturation: 95-99% on room air  Referral to pulmonologist entered Ordered ANA and RF Ordered echocardiogram to eval aortic valve regurgitation and pul. HTN? Continue use of benzonatate, albuterol and pantoprazole Start sucralfate.       Relevant Medications   Spacer/Aero-Holding Chambers DEVI   benzonatate (TESSALON) 100 MG capsule   Other Relevant Orders   Antinuclear Antib (ANA)   Rheumatoid Factor   CBC with Differential/Platelet (Completed)   ECHOCARDIOGRAM COMPLETE     Digestive   Gastroesophageal reflux disease with esophagitis - Primary    Mild improvement in GERD symptoms Compliant with pantoprazole 40mg , but did not start carafate.  Continue pantoprazole and start sucralfate       Relevant Medications   sucralfate (CARAFATE) 1 GM/10ML suspension   pantoprazole (PROTONIX) 40 MG tablet     Musculoskeletal and Integument   DDD (degenerative disc disease), lumbosacral    Other Visit Diagnoses    Aortic valve insufficiency, etiology of cardiac valve disease unspecified       Relevant  Orders   ECHOCARDIOGRAM COMPLETE   Dyspnea, unspecified type       Relevant Orders   ECHOCARDIOGRAM COMPLETE      Follow-up: Return in about 4 weeks (around 07/24/2020) for DM and HTN, hyperlipidemia, GERD (fasting, 07/26/2020).  , NP

## 2020-06-26 NOTE — Patient Instructions (Addendum)
Continue pantoprazole 40mg  before breakfast Start carafate liquid with meals and at bedtime x 30days.  Schedule appt with pulmonologist.  Use albuterol as needed with spacer  You will be contacted to schedule appt for echocardiogram  Go to lab for blood draw.

## 2020-06-26 NOTE — Assessment & Plan Note (Signed)
Mild improvement in GERD symptoms Compliant with pantoprazole 40mg , but did not start carafate.  Continue pantoprazole and start sucralfate

## 2020-06-27 ENCOUNTER — Encounter: Payer: Self-pay | Admitting: Nurse Practitioner

## 2020-06-30 LAB — ANTI-NUCLEAR AB-TITER (ANA TITER)
ANA TITER: 1:80 {titer} — ABNORMAL HIGH
ANA Titer 1: 1:40 {titer} — ABNORMAL HIGH

## 2020-06-30 LAB — RHEUMATOID FACTOR: Rheumatoid fact SerPl-aCnc: <14 IU/mL (ref <14)

## 2020-06-30 LAB — ANA: Anti Nuclear Antibody (ANA): POSITIVE — AB

## 2020-07-01 ENCOUNTER — Encounter: Payer: Self-pay | Admitting: Nurse Practitioner

## 2020-07-03 ENCOUNTER — Encounter: Payer: Self-pay | Admitting: Nurse Practitioner

## 2020-07-05 ENCOUNTER — Other Ambulatory Visit: Payer: Self-pay | Admitting: Nurse Practitioner

## 2020-07-05 DIAGNOSIS — M5137 Other intervertebral disc degeneration, lumbosacral region: Secondary | ICD-10-CM

## 2020-07-09 ENCOUNTER — Encounter: Payer: Self-pay | Admitting: Nurse Practitioner

## 2020-07-13 ENCOUNTER — Telehealth: Payer: Self-pay | Admitting: Nurse Practitioner

## 2020-07-13 NOTE — Telephone Encounter (Signed)
Patient dropped off FMLA forms to be completed by her provider. Forms placed in folder up front for pick up.

## 2020-07-15 NOTE — Telephone Encounter (Signed)
LVM for patient to return call. 

## 2020-07-16 ENCOUNTER — Encounter: Payer: Self-pay | Admitting: Pulmonary Disease

## 2020-07-16 ENCOUNTER — Encounter: Payer: Self-pay | Admitting: Nurse Practitioner

## 2020-07-16 ENCOUNTER — Ambulatory Visit (INDEPENDENT_AMBULATORY_CARE_PROVIDER_SITE_OTHER): Payer: BC Managed Care – PPO | Admitting: Pulmonary Disease

## 2020-07-16 ENCOUNTER — Other Ambulatory Visit: Payer: Self-pay

## 2020-07-16 VITALS — BP 120/60 | HR 91 | Temp 98.7°F | Ht 59.0 in | Wt 135.2 lb

## 2020-07-16 DIAGNOSIS — J8410 Pulmonary fibrosis, unspecified: Secondary | ICD-10-CM

## 2020-07-16 DIAGNOSIS — K219 Gastro-esophageal reflux disease without esophagitis: Secondary | ICD-10-CM

## 2020-07-16 DIAGNOSIS — K222 Esophageal obstruction: Secondary | ICD-10-CM | POA: Diagnosis not present

## 2020-07-16 LAB — C-REACTIVE PROTEIN: CRP: <1 mg/dL (ref 0.5–20.0)

## 2020-07-16 LAB — SEDIMENTATION RATE: Sed Rate: 76 mm/hr — ABNORMAL HIGH (ref 0–30)

## 2020-07-16 NOTE — Addendum Note (Signed)
Addended by: Benjie Karvonen R on: 07/16/2020 10:51 AM   Modules accepted: Orders

## 2020-07-16 NOTE — Progress Notes (Signed)
Sandra Curtis    [id number]    09-06-1956  Primary Care Physician:Nche, Bonna Gains, NP  Referring Physician: [name], NP [address] [place],  [place] [address]  Chief complaint: Consult for interstitial lung disease  HPI: 64 year old with history of allergies, GERD, esophageal stricture Complains of chronic cough for several years, worsening dyspnea on exertion. She had a CT scan done by Dr. Elease Etienne, primary care showing pulmonary fibrosis and has been referred for further evaluation.  Previously followed by Dr. Russella Dar, GI with EGD in 2004 showing esophageal stricture secondary to acid reflux.  She has not followed up since then Started on Nexium in August 2021 by primary care.  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  No mold, hot tub, Jacuzzi.  No feather pillows or comforters Smoking history: 18-pack-year smoker.  Quit 1 month ago Travel history: No significant travel history Relevant family history: No significant family issue of lung disease  Outpatient Encounter Medications as of 07/16/2020  Medication Sig  . acetaminophen (TYLENOL 8 HOUR ARTHRITIS PAIN) 650 MG CR tablet Take 1 tablet (650 mg total) by mouth every 8 (eight) hours as needed for pain.  Marland Kitchen albuterol (VENTOLIN HFA) 108 (90 Base) MCG/ACT inhaler Inhale 1-2 puffs into the lungs every 6 (six) hours as needed.  . benzonatate (TESSALON) 100 MG capsule Take 1 capsule (100 mg total) by mouth every 8 (eight) hours.  . diclofenac Sodium (VOLTAREN) 1 % GEL Apply 2 g topically 4 (four) times daily.  Marland Kitchen gabapentin (NEURONTIN) 100 MG capsule Take 1 capsule (100 mg total) by mouth 3 (three) times daily.  . pantoprazole (PROTONIX) 40 MG tablet Take 1 tablet (40 mg total) by mouth daily.  Marland Kitchen Spacer/Aero-Holding Chambers (AEROCHAMBER PLUS FLO-VU W/MASK) MISC USE AS DIRECTED  ONCE FOR 1 DOSE  . sucralfate (CARAFATE) 1 GM/10ML suspension Take 10 mLs (1 g total) by mouth 4 (four) times daily -  with meals and at bedtime.   No facility-administered encounter medications on file as of 07/16/2020.    Allergies as of 07/16/2020 - Review Complete 07/16/2020  Allergen Reaction Noted  . Iodine Hives 07/11/2012    Past Medical History:  Diagnosis Date  . Allergic rhinitis   . Allergy   . Chronic back pain 07/20/2018  . Chronic midline low back pain 02/06/2017  . Depression   . Esophageal stricture   . GERD (gastroesophageal reflux disease)   . Inflammatory polyps of colon (HCC) 2010  . Systolic murmur   . UTI (urinary tract infection)     History reviewed. No pertinent surgical history.  Family History  Problem Relation Age of Onset  . Arthritis Mother   . Thyroid disease Mother   . Heart disease Father   . Hypertension Father   . Diabetes Father   . Arthritis Sister   . Colon cancer Neg Hx   . Stomach cancer Neg Hx     Social History   Socioeconomic History  . Marital status: Single    Spouse name: Not on file  . Number of children: 1  . Years of education: Not on file  . Highest education level: Not on file  Occupational History  . Occupation: Biomedical engineer: paperworks  Tobacco Use  . Smoking status: Former Smoker    Packs/day: 0.50    Years: 35.00  Pack years: 17.50    Quit date: 04/12/2018    Years since quitting: 2.2  . Smokeless tobacco: Never Used  Vaping Use  . Vaping Use: Never used  Substance and Sexual Activity  . Alcohol use: Yes    Comment: occasionally  . Drug use: No  . Sexual activity: Not Currently  Other Topics Concern  . Not on file  Social History Narrative  . Not on file   Social Determinants of Health   Financial Resource Strain:   . Difficulty of Paying Living Expenses: Not on file  Food Insecurity:   . Worried About Programme researcher, broadcasting/film/video in the Last Year: Not on file  . Ran Out of Food in the  Last Year: Not on file  Transportation Needs:   . Lack of Transportation (Medical): Not on file  . Lack of Transportation (Non-Medical): Not on file  Physical Activity:   . Days of Exercise per Week: Not on file  . Minutes of Exercise per Session: Not on file  Stress:   . Feeling of Stress : Not on file  Social Connections:   . Frequency of Communication with Friends and Family: Not on file  . Frequency of Social Gatherings with Friends and Family: Not on file  . Attends Religious Services: Not on file  . Active Member of Clubs or Organizations: Not on file  . Attends Banker Meetings: Not on file  . Marital Status: Not on file  Intimate Partner Violence:   . Fear of Current or Ex-Partner: Not on file  . Emotionally Abused: Not on file  . Physically Abused: Not on file  . Sexually Abused: Not on file    Review of systems: Review of Systems  Constitutional: Negative for fever and chills.  HENT: Negative.   Eyes: Negative for blurred vision.  Respiratory: as per HPI  Cardiovascular: Negative for chest pain and palpitations.  Gastrointestinal: Negative for vomiting, diarrhea, blood per rectum. Genitourinary: Negative for dysuria, urgency, frequency and hematuria.  Musculoskeletal: Negative for myalgias, back pain and joint pain.  Skin: Negative for itching and rash.  Neurological: Negative for dizziness, tremors, focal weakness, seizures and loss of consciousness.  Endo/Heme/Allergies: Negative for environmental allergies.  Psychiatric/Behavioral: Negative for depression, suicidal ideas and hallucinations.  All other systems reviewed and are negative.  Physical Exam: Blood pressure 120/60, pulse 91, temperature 98.7 F (37.1 C), temperature source Temporal, height 4\' 11"  (1.499 m), weight 135 lb 3.2 oz (61.3 kg), SpO2 93 %. Gen:      No acute distress HEENT:  EOMI, sclera anicteric Neck:     No masses; no thyromegaly Lungs:    Clear to auscultation bilaterally;  normal respiratory effort CV:         Regular rate and rhythm; no murmurs Abd:      + bowel sounds; soft, non-tender; no palpable masses, no distension Ext:    No edema; adequate peripheral perfusion Skin:      Warm and dry; no rash Neuro: alert and oriented x 3 Psych: normal mood and affect  Data Reviewed: Imaging: Chest x-ray 05/15/2020-diffuse interstitial opacities CT chest 06/17/2020-reticulation with traction bronchiectasis, honeycombing most prominent in the right lung base.  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  PFTs:  Labs: ANA 06/27/2019 1:40, cytoplasmic, rheumatoid factor less than 14  Assessment:  Evaluation for interstitial lung disease CT with UIP pattern fibrosis.  Will review her scan and the ILD multidisciplinary conference. She does have mild arthritis symptoms which may be  secondary to osteoarthritis but does endorse some morning stiffness and small joint pain.  She had some occupational exposures to textile fiber and dust but no asbestos  History notable for significant GERD symptoms, reflux and esophageal stricture.  Reflux may explain the of pulmonary fibrosis especially as it is asymmetric.    We will get extended CTD serologies and pulmonary function test. ILD questionnaire given to elicit exposures Referral to GI for evaluation of GERD and esophageal stricture.  She will likely need a manometric and pH probe  Follow-up in 1 to 2 months for review of test and plan for next steps in therapies.  Plan/Recommendations: CTD serologies, PFTs ILD questionnaire for exposure history Referral to GI  Chilton Greathouse MD  Pulmonary and Critical Care 07/16/2020, 9:54 AM  CC: Nche, Bonna Gains, NP

## 2020-07-16 NOTE — Patient Instructions (Signed)
We will check some labs to evaluate interstitial lung disease Referral to gastroenterology to reevaluate GERD, history of esophageal stricture, manometry and pH probe for acid reflux, ILD work-up We will give an ILD questionnaire to evaluate exposures.  Schedule pulmonary function test Follow-up in clinic in 1 to 2 months for review and plan for next steps.

## 2020-07-17 LAB — ANTI-SMITH ANTIBODY: ENA SM Ab Ser-aCnc: <1 AI

## 2020-07-20 ENCOUNTER — Ambulatory Visit (HOSPITAL_COMMUNITY): Payer: BC Managed Care – PPO | Attending: Cardiology

## 2020-07-20 ENCOUNTER — Other Ambulatory Visit: Payer: Self-pay

## 2020-07-20 DIAGNOSIS — J8410 Pulmonary fibrosis, unspecified: Secondary | ICD-10-CM | POA: Diagnosis not present

## 2020-07-20 DIAGNOSIS — R0600 Dyspnea, unspecified: Secondary | ICD-10-CM | POA: Insufficient documentation

## 2020-07-20 DIAGNOSIS — I351 Nonrheumatic aortic (valve) insufficiency: Secondary | ICD-10-CM | POA: Insufficient documentation

## 2020-07-20 LAB — ECHOCARDIOGRAM COMPLETE
AR max vel: 1.49 cm2
AV Area VTI: 1.74 cm2
AV Area mean vel: 1.66 cm2
AV Mean grad: 6 mmHg
AV Peak grad: 12.1 mmHg
Ao pk vel: 1.74 m/s
Area-P 1/2: 3.55 cm2
P 1/2 time: 348 msec
S' Lateral: 2.7 cm

## 2020-07-20 NOTE — Telephone Encounter (Signed)
Caller Name: Madell  Call back phone #: [phone]  Reason for Call: pt returning call about FMLA paperwork. She will be home today until 12:30pm. Please call before.

## 2020-07-22 ENCOUNTER — Telehealth: Payer: Self-pay | Admitting: Nurse Practitioner

## 2020-07-22 ENCOUNTER — Encounter: Payer: Self-pay | Admitting: Nurse Practitioner

## 2020-07-22 LAB — ANTI-NUCLEAR AB-TITER (ANA TITER)
ANA TITER: 1:40 {titer} — ABNORMAL HIGH
ANA Titer 1: 1:40 {titer} — ABNORMAL HIGH

## 2020-07-22 LAB — RHEUMATOID FACTOR: Rheumatoid fact SerPl-aCnc: <14 IU/mL (ref <14)

## 2020-07-22 LAB — CK TOTAL AND CKMB (NOT AT ARMC)
CK, MB: <0.7 ng/mL (ref 0–5.0)
Total CK: 88 U/L (ref 29–143)

## 2020-07-22 LAB — SJOGRENS SYNDROME-A EXTRACTABLE NUCLEAR ANTIBODY: SSA (Ro) (ENA) Antibody, IgG: >8 AI — AB

## 2020-07-22 LAB — ALDOLASE: Aldolase: 3.7 U/L (ref <=8.1)

## 2020-07-22 LAB — ANTI-SCLERODERMA ANTIBODY: Scleroderma (Scl-70) (ENA) Antibody, IgG: <1 AI

## 2020-07-22 LAB — CYCLIC CITRUL PEPTIDE ANTIBODY, IGG: Cyclic Citrullin Peptide Ab: <16 UNITS

## 2020-07-22 LAB — SJOGRENS SYNDROME-B EXTRACTABLE NUCLEAR ANTIBODY: SSB (La) (ENA) Antibody, IgG: <1 AI

## 2020-07-22 LAB — CENTROMERE ANTIBODIES: Centromere Ab Screen: <1 AI

## 2020-07-22 LAB — ANCA SCREEN W REFLEX TITER: ANCA Screen: NEGATIVE

## 2020-07-22 LAB — JO-1 ANTIBODY-IGG: Jo-1 Autoabs: <1 AI

## 2020-07-22 LAB — ANA: Anti Nuclear Antibody (ANA): POSITIVE — AB

## 2020-07-22 LAB — ANTI-DNA ANTIBODY, DOUBLE-STRANDED: ds DNA Ab: <1 IU/mL

## 2020-07-22 NOTE — Telephone Encounter (Signed)
Patient is returning the call, please advise. CB is [phone]

## 2020-07-29 ENCOUNTER — Encounter: Payer: Self-pay | Admitting: Nurse Practitioner

## 2020-07-29 ENCOUNTER — Encounter: Payer: Self-pay | Admitting: *Deleted

## 2020-07-29 LAB — MYOMARKER 3 PLUS PROFILE (RDL)

## 2020-07-30 ENCOUNTER — Telehealth: Payer: Self-pay | Admitting: Pulmonary Disease

## 2020-07-30 ENCOUNTER — Encounter: Payer: Self-pay | Admitting: Nurse Practitioner

## 2020-07-30 DIAGNOSIS — R768 Other specified abnormal immunological findings in serum: Secondary | ICD-10-CM

## 2020-07-30 NOTE — Telephone Encounter (Signed)
ATC patient to let her know about recs from Dr. Isaiah Serge and that at this time he would like to refer her to rheumatology based off her blood work. Advised patient to call back if she would like to proceed with referral. Will wait to hear back from patient

## 2020-07-31 ENCOUNTER — Encounter: Payer: Self-pay | Admitting: Nurse Practitioner

## 2020-07-31 NOTE — Telephone Encounter (Signed)
Spoke to patient, who is agreeable with referral to rheumatology. Referral has been placed.  Nothing further needed.

## 2020-08-03 ENCOUNTER — Other Ambulatory Visit: Payer: Self-pay

## 2020-08-04 ENCOUNTER — Ambulatory Visit (INDEPENDENT_AMBULATORY_CARE_PROVIDER_SITE_OTHER): Payer: BC Managed Care – PPO | Admitting: Nurse Practitioner

## 2020-08-04 ENCOUNTER — Encounter: Payer: Self-pay | Admitting: Nurse Practitioner

## 2020-08-04 VITALS — BP 126/70 | HR 74 | Temp 97.6°F | Ht 59.0 in | Wt 136.7 lb

## 2020-08-04 DIAGNOSIS — M5137 Other intervertebral disc degeneration, lumbosacral region: Secondary | ICD-10-CM | POA: Diagnosis not present

## 2020-08-04 NOTE — Patient Instructions (Signed)
Increase gabapentin to 300mg  at bedtime Schedule appt with Dr. to discuss back pain and completion of FMLA.

## 2020-08-04 NOTE — Progress Notes (Signed)
Subjective:  Patient ID: Sandra Curtis, female    DOB: 1956/04/14  Age: 64 y.o. MRN: [id number]  CC: Acute Visit (Pt states she has been having back pain and is in need of FMLA paperwork for her job. Pt states when her back flares up she needs days off work because working makes it worse. )  HPI  DDD (degenerative disc disease), lumbosacral Chronic, waxing and waning, worse after working, denies any radicular pain or weakness or paresthesia. Current use of gabapentin 100mg  and tylenol.  Increase gabapentin to 300mg  at hs Continue tylenol 650mg  every 8hrs. Advised to schedule f/up appt with Dr. to discuss need for disability form. F/up in 30month    Reviewed past Medical, Social and Family history today.  Outpatient Medications Prior to Visit  Medication Sig Dispense Refill  . acetaminophen (TYLENOL 8 HOUR ARTHRITIS PAIN) 650 MG CR tablet Take 1 tablet (650 mg total) by mouth every 8 (eight) hours as needed for pain.    albuterol (VENTOLIN HFA) 108 (90 Base) MCG/ACT inhaler Inhale 1-2 puffs into the lungs every 6 (six) hours as needed. 6.7 g 0  . benzonatate (TESSALON) 100 MG capsule Take 1 capsule (100 mg total) by mouth every 8 (eight) hours. 90 capsule 0  . diclofenac Sodium (VOLTAREN) 1 % GEL Apply 2 g topically 4 (four) times daily. 150 g 5  . gabapentin (NEURONTIN) 100 MG capsule Take 3 capsules (300 mg total) by mouth at bedtime. 90 capsule 5  . pantoprazole (PROTONIX) 40 MG tablet Take 1 tablet (40 mg total) by mouth daily. 90 tablet 0  . Spacer/Aero-Holding Chambers (AEROCHAMBER PLUS FLO-VU W/MASK) MISC USE AS DIRECTED ONCE FOR 1 DOSE 1 each 0  . sucralfate (CARAFATE) 1 GM/10ML suspension Take 10 mLs (1 g total) by mouth 4 (four) times daily -  with meals and at bedtime. 420 mL 2  . gabapentin (NEURONTIN) 100 MG capsule Take 1 capsule (100 mg total) by mouth 3 (three) times daily. 90 capsule 5   No facility-administered medications prior to visit.     ROS See HPI  Objective:  BP 126/70 (BP Location: Left Arm, Patient Position: Sitting, Cuff Size: Normal)   Pulse 74   Temp 97.6 F (36.4 C) (Temporal)   Ht 4\' 11"  (1.499 m)   Wt 136 lb 11.2 oz (62 kg)   SpO2 97%   BMI 27.61 kg/m   Physical Exam Cardiovascular:     Rate and Rhythm: Normal rate.     Pulses: Normal pulses.  Musculoskeletal:     Cervical back: Tenderness present. No swelling, erythema, rigidity or crepitus. Pain with movement present.     Thoracic back: Spasms and tenderness present. No swelling or bony tenderness. Normal range of motion. Scoliosis present.     Lumbar back: Tenderness present. No signs of trauma, spasms or bony tenderness. Normal range of motion. Negative right straight leg raise test and negative left straight leg raise test. Scoliosis present.     Right lower leg: No edema.     Left lower leg: No edema.  Neurological:     Mental Status: She is alert and oriented to person, place, and time.     Motor: No weakness.     Gait: Gait normal.  Psychiatric:        Mood and Affect: Mood normal.        Behavior: Behavior normal.     Assessment & Plan:  This visit occurred during the SARS-CoV-2 public health  emergency.  Safety protocols were in place, including screening questions prior to the visit, additional usage of staff PPE, and extensive cleaning of exam room while observing appropriate contact time as indicated for disinfecting solutions.   Sandra Curtis was seen today for acute visit.  Diagnoses and all orders for this visit:  DDD (degenerative disc disease), lumbosacral   Problem List Items Addressed This Visit      Musculoskeletal and Integument   DDD (degenerative disc disease), lumbosacral - Primary    Chronic, waxing and waning, worse after working, denies any radicular pain or weakness or paresthesia. Current use of gabapentin 100mg  and tylenol.  Increase gabapentin to 300mg  at hs Continue tylenol 650mg  every 8hrs. Advised to  schedule f/up appt with Dr. to discuss need for disability form. F/up in 11month       Relevant Medications   gabapentin (NEURONTIN) 100 MG capsule      Follow-up: Return in about 4 weeks (around 09/01/2020) for CPE (fasting).  Ophelia Charter, NP

## 2020-08-04 NOTE — Assessment & Plan Note (Signed)
Chronic, waxing and waning, worse after working, denies any radicular pain or weakness or paresthesia. Current use of gabapentin 100mg  and tylenol.  Increase gabapentin to 300mg  at hs Continue tylenol 650mg  every 8hrs. Advised to schedule f/up appt with Dr. to discuss need for disability form. F/up in 68month

## 2020-08-10 LAB — HYPERSENSITIVITY PNEUMONITIS
A. Pullulans Abs: NEGATIVE
A.Fumigatus #1 Abs: NEGATIVE
Micropolyspora faeni, IgG: NEGATIVE
Pigeon Serum Abs: NEGATIVE
Thermoact. Saccharii: NEGATIVE
Thermoactinomyces vulgaris, IgG: NEGATIVE

## 2020-08-10 LAB — RNP ANTIBODIES: ENA RNP Ab: <0.2 AI (ref 0.0–0.9)

## 2020-08-12 ENCOUNTER — Ambulatory Visit: Payer: Self-pay

## 2020-08-12 ENCOUNTER — Ambulatory Visit (INDEPENDENT_AMBULATORY_CARE_PROVIDER_SITE_OTHER): Payer: BC Managed Care – PPO | Admitting: Orthopaedic Surgery

## 2020-08-12 ENCOUNTER — Encounter: Payer: Self-pay | Admitting: Orthopaedic Surgery

## 2020-08-12 VITALS — BP 145/65 | Ht 59.0 in | Wt 135.0 lb

## 2020-08-12 DIAGNOSIS — M545 Low back pain, unspecified: Secondary | ICD-10-CM

## 2020-08-12 DIAGNOSIS — G8929 Other chronic pain: Secondary | ICD-10-CM | POA: Diagnosis not present

## 2020-08-12 DIAGNOSIS — M542 Cervicalgia: Secondary | ICD-10-CM | POA: Diagnosis not present

## 2020-08-12 NOTE — Progress Notes (Signed)
Office Visit Note   Patient: Sandra Curtis           Date of Birth: 1956-03-26           MRN: [id number] Visit Date: 08/12/2020              Requested by: [name], NP [address] [place],  [place] [address] PCP: [name], NP   Assessment & Plan: Visit Diagnoses:  1. Neck pain   2. Chronic bilateral low back pain, unspecified whether sciatica present     Plan: Patient is been taking Tylenol and gabapentin for symptoms.  She is trying to make 1 more year of work so that she can retire.  She has no evidence of myelopathy or radiculopathy on exam.  She has been diagnosed with pulmonary fibrosis and is on inhalers for this.  She also has type II got diabetes but is not on insulin.  If she gets more localized symptoms with development of radiculopathy and we can consider diagnostic imaging.  We discussed she might try some Aleve 2 p.o. twice daily short-term to see if this gives her some improvement since she does have some facet arthritis both in her neck as well as in her lumbar spine.  BUN and creatinine tests were normal.  She will return if she has increased symptoms.  Follow-Up Instructions: No follow-ups on file.   Orders:  Orders Placed This Encounter  Procedures  . XR Cervical Spine 2 or 3 views  . XR Lumbar Spine 2-3 Views  . XR Pelvis 1-2 Views   No orders of the defined types were placed in this encounter.     Procedures: No procedures performed   Clinical Data: No additional findings.   Subjective: Chief Complaint  Patient presents with  . Neck - Pain  . Lower Back - Pain  . Right Leg - Pain    HPI 64 year old female returns with ongoing problems with back pain neck pain pain in the back that radiates into the hips.  Patient had previous L1 compression fracture which is healed.  She has facet arthropathy at L4-5 L5-S1 with some lumbar scoliosis.  Patient's had pain in her neck not as bad as the lumbar spine the pain  radiates into her shoulder.  Patient states she was trying to work 1 more year to reach retirement.  She stands on a mat while working as to turning twisting and lifting activities which tends to aggravate her back.  She denies fever chills no bowel bladder symptoms.  Review of Systems 14 point system positive for chronic low back pain disc degeneration history of GI reflux type 2 diabetes not on insulin.  Some problems occasionally with headaches.  She denies extremity numbness or tingling.   Objective: Vital Signs: BP (!) 145/65   Ht 4\' 11"  (1.499 m)   Wt 135 lb (61.2 kg)   BMI 27.27 kg/m   Physical Exam Constitutional:      Appearance: She is well-developed.  HENT:     Head: Normocephalic.     Right Ear: External ear normal.     Left Ear: External ear normal.  Eyes:     Pupils: Pupils are equal, round, and reactive to light.  Neck:     Thyroid: No thyromegaly.     Trachea: No tracheal deviation.  Cardiovascular:     Rate and Rhythm: Normal rate.  Pulmonary:     Effort: Pulmonary effort is normal.  Abdominal:  Palpations: Abdomen is soft.  Skin:    General: Skin is warm and dry.  Neurological:     Mental Status: She is alert and oriented to person, place, and time.  Psychiatric:        Behavior: Behavior normal.     Ortho Exam patient has intact reflexes negative logroll of the hip she has some tenderness over the trochanter over the sciatic notch over the lumbosacral junction more on the right than left side.  When she first gets up she walks with an antalgic gait which improves after number steps.  She is able to heel and toe walk.  No calf atrophy.  Anterior tib is strong and symmetrical.  Distal pulses are 2+.  Specialty Comments:  No specialty comments available.  Imaging: No results found.   PMFS History: Patient Active Problem List   Diagnosis Date Noted  . Pulmonary fibrosis (HCC) 06/26/2020  . Chronic cough 05/13/2020  . Rash 10/30/2019  . Atrophic  vaginitis 01/09/2019  . Menopausal flushing 01/09/2019  . Colon polyp, hyperplastic 01/09/2019  . Type 2 diabetes mellitus without complication, without long-term current use of insulin (HCC) 12/11/2017  . Anemia 12/11/2017  . Insomnia 02/06/2017  . Gastroesophageal reflux disease with esophagitis 02/06/2017  . DDD (degenerative disc disease), lumbosacral 02/06/2017  . Constipation 02/06/2017  . Dysphagia 02/06/2017  . Scoliosis of lumbar spine 02/06/2017  . Systolic murmur 10/02/2013  . Headache 03/01/2012  . Seasonal allergic rhinitis 03/01/2012   Past Medical History:  Diagnosis Date  . Allergic rhinitis   . Allergy   . Chronic back pain 07/20/2018  . Chronic midline low back pain 02/06/2017  . Depression   . Esophageal stricture   . GERD (gastroesophageal reflux disease)   . Inflammatory polyps of colon (HCC) 2010  . Systolic murmur   . UTI (urinary tract infection)     Family History  Problem Relation Age of Onset  . Arthritis Mother   . Thyroid disease Mother   . Heart disease Father   . Hypertension Father   . Diabetes Father   . Arthritis Sister   . Colon cancer Neg Hx   . Stomach cancer Neg Hx     No past surgical history on file. Social History   Occupational History  . Occupation: Biomedical engineer: paperworks  Tobacco Use  . Smoking status: Former Smoker    Packs/day: 0.50    Years: 35.00    Pack years: 17.50    Quit date: 04/12/2018    Years since quitting: 2.3  . Smokeless tobacco: Never Used  Vaping Use  . Vaping Use: Never used  Substance and Sexual Activity  . Alcohol use: Yes    Comment: occasionally  . Drug use: No  . Sexual activity: Not Currently

## 2020-08-28 ENCOUNTER — Ambulatory Visit (INDEPENDENT_AMBULATORY_CARE_PROVIDER_SITE_OTHER): Payer: BC Managed Care – PPO | Admitting: Gastroenterology

## 2020-08-28 ENCOUNTER — Encounter: Payer: Self-pay | Admitting: Gastroenterology

## 2020-08-28 VITALS — BP 114/60 | HR 76 | Ht 59.0 in | Wt 136.6 lb

## 2020-08-28 DIAGNOSIS — K219 Gastro-esophageal reflux disease without esophagitis: Secondary | ICD-10-CM | POA: Diagnosis not present

## 2020-08-28 DIAGNOSIS — R1310 Dysphagia, unspecified: Secondary | ICD-10-CM

## 2020-08-28 MED ORDER — PANTOPRAZOLE SODIUM 40 MG PO TBEC
40.0000 mg | DELAYED_RELEASE_TABLET | Freq: Two times a day (BID) | ORAL | 11 refills | Status: DC
Start: 2020-08-28 — End: 2021-03-01

## 2020-08-28 NOTE — Progress Notes (Signed)
History of Present Illness: This is a 64 year old female referred by Nche, Bonna Gains, NP for the evaluation of GERD, dysphagia.  She notes frequent burning midline chest pain and occasionally throat pain.  Symptoms often occur following meals and have occurred at nighttime as well.  Protonix daily and Carafate as needed have not been helping.  She notes a decrease in appetite.  She has noted difficulties with solid food dysphagia over the past several weeks.  EGD in 2004 showed erosive gastritis and an esophageal stricture which was dilated.     Allergies  Allergen Reactions  . Iodine Hives    Hives    Outpatient Medications Prior to Visit  Medication Sig Dispense Refill  . acetaminophen (TYLENOL 8 HOUR ARTHRITIS PAIN) 650 MG CR tablet Take 1 tablet (650 mg total) by mouth every 8 (eight) hours as needed for pain.    Marland Kitchen albuterol (VENTOLIN HFA) 108 (90 Base) MCG/ACT inhaler Inhale 1-2 puffs into the lungs every 6 (six) hours as needed. 6.7 g 0  . benzonatate (TESSALON) 100 MG capsule Take 1 capsule (100 mg total) by mouth every 8 (eight) hours. 90 capsule 0  . diclofenac Sodium (VOLTAREN) 1 % GEL Apply 2 g topically 4 (four) times daily. (Patient taking differently: Apply 2 g topically daily. ) 150 g 5  . gabapentin (NEURONTIN) 100 MG capsule Take 3 capsules (300 mg total) by mouth at bedtime. 90 capsule 5  . pantoprazole (PROTONIX) 40 MG tablet Take 1 tablet (40 mg total) by mouth daily. 90 tablet 0  . Spacer/Aero-Holding Chambers (AEROCHAMBER PLUS FLO-VU W/MASK) MISC USE AS DIRECTED ONCE FOR 1 DOSE 1 each 0  . sucralfate (CARAFATE) 1 GM/10ML suspension Take 10 mLs (1 g total) by mouth 4 (four) times daily -  with meals and at bedtime. 420 mL 2   No facility-administered medications prior to visit.   Past Medical History:  Diagnosis Date  . Allergic rhinitis   . Allergy   . Chronic back pain 07/20/2018  . Chronic midline low back pain 02/06/2017  . Depression   . Esophageal  stricture   . GERD (gastroesophageal reflux disease)   . Inflammatory polyps of colon (HCC) 2010  . Systolic murmur   . UTI (urinary tract infection)    Past Surgical History:  Procedure Laterality Date  . COLONOSCOPY    . UPPER GASTROINTESTINAL ENDOSCOPY     Social History   Socioeconomic History  . Marital status: Single    Spouse name: Not on file  . Number of children: 1  . Years of education: Not on file  . Highest education level: Not on file  Occupational History  . Occupation: Biomedical engineer: paperworks  Tobacco Use  . Smoking status: Former Smoker    Packs/day: 0.50    Years: 35.00    Pack years: 17.50    Types: Cigarettes    Quit date: 04/12/2018    Years since quitting: 2.3  . Smokeless tobacco: Never Used  Vaping Use  . Vaping Use: Never used  Substance and Sexual Activity  . Alcohol use: Not Currently  . Drug use: No  . Sexual activity: Not Currently  Other Topics Concern  . Not on file  Social History Narrative  . Not on file   Social Determinants of Health   Financial Resource Strain:   . Difficulty of Paying Living Expenses: Not on file  Food Insecurity:   . Worried About [employer] of  Food in the Last Year: Not on file  . Ran Out of Food in the Last Year: Not on file  Transportation Needs:   . Lack of Transportation (Medical): Not on file  . Lack of Transportation (Non-Medical): Not on file  Physical Activity:   . Days of Exercise per Week: Not on file  . Minutes of Exercise per Session: Not on file  Stress:   . Feeling of Stress : Not on file  Social Connections:   . Frequency of Communication with Friends and Family: Not on file  . Frequency of Social Gatherings with Friends and Family: Not on file  . Attends Religious Services: Not on file  . Active Member of Clubs or Organizations: Not on file  . Attends Banker Meetings: Not on file  . Marital Status: Not on file   Family History  Problem Relation Age of Onset   . Arthritis Mother   . Thyroid disease Mother   . Heart disease Father   . Hypertension Father   . Diabetes Father   . Arthritis Sister   . Colon cancer Neg Hx   . Stomach cancer Neg Hx       Review of Systems: Pertinent positive and negative review of systems were noted in the above HPI section. All other review of systems were otherwise negative.   Physical Exam: General: Well developed, well nourished, no acute distress Head: Normocephalic and atraumatic Eyes:  sclerae anicteric, EOMI Ears: Normal auditory acuity Mouth: Not examined, mask on during Covid-19 pandemic Neck: Supple, no masses or thyromegaly Lungs: Clear throughout to auscultation Heart: Regular rate and rhythm; no murmurs, rubs or bruits Abdomen: Soft, non tender and non distended. No masses, hepatosplenomegaly or hernias noted. Normal Bowel sounds Rectal: Not done Musculoskeletal: Symmetrical with no gross deformities  Skin: No lesions on visible extremities Pulses:  Normal pulses noted Extremities: No clubbing, cyanosis, edema or deformities noted Neurological: Alert oriented x 4, grossly nonfocal Cervical Nodes:  No significant cervical adenopathy Inguinal Nodes: No significant inguinal adenopathy Psychological:  Alert and cooperative. Normal mood and affect   Assessment and Recommendations:  1. GERD with solid food dysphagia.  Increase pantoprazole to 40 mg twice daily taken 30 minutes AC.  Closely follow antireflux measures.  Continue Carafate suspension 1 g 4 times daily as needed.  Schedule EGD with possible dilation. The risks (including bleeding, perforation, infection, missed lesions, medication reactions and possible hospitalization or surgery if complications occur), benefits, and alternatives to endoscopy with possible biopsy and possible dilation were discussed with the patient and they consent to proceed.   2. CRC screening, average risk.  She is up-to-date on CRC screening.  Her next screening  colonoscopy is recommended in March 2025.   cc: [name], NP [address] [place],  [place] [address]

## 2020-08-28 NOTE — Patient Instructions (Signed)
We have sent the following medications to your pharmacy for you to pick up at your convenience: pantoprazole 40 mg twice daily.   You have been scheduled for an endoscopy. Please follow written instructions given to you at your visit today. If you use inhalers (even only as needed), please bring them with you on the day of your procedure.  Normal BMI (Body Mass Index- based on height and weight) is between 19 and 25. Your BMI today is Body mass index is 27.59 kg/m. Marland Kitchen Please consider follow up  regarding your BMI with your Primary Care Provider.  Thank you for choosing me and [facility].  Venita Lick. Pleas Koch., MD., Clementeen Graham

## 2020-09-08 NOTE — Progress Notes (Signed)
Office Visit Note  Patient: Sandra Curtis             Date of Birth: 01/15/1956           MRN: [id number]             PCP: Flossie Buffy, NP Referring: Marshell Garfinkel, MD Visit Date: 09/09/2020 Occupation: Oncologist  Subjective:  Pain of the Neck, Pain of the Lower Back, and New Patient (Initial Visit)   History of Present Illness: Sandra Curtis is a 64 y.o. female with history of GERD with esophagitis, diabetes, and a recent diagnosis of pulmonary fibrosis here for evaluation of positive SSA Abs. She was recently diagnosed with this by Dr. 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 She works in a [employer] with frequent dust and small particle exposure but has never had significant irritation from working there over previous years. Workup also demonstrated positive ANA and positive SSA Ab titers. She reports pain in the neck and lower back which are fairly longstanding but does also have pain and stiffness of her hands. Previous workup for back pain included xray demonstrating healed vertebral fracture and scoliosis without any erosive disease or fusion. Symptoms also include radiculopathy pain down the right leg. Her hand pain is more recent. She has morning stiffness lasing less than 15 minutes. She has noticed some nodules on the PIP joints. She does not notice significant joint swelling in general. She denies significant dry eyes or dry mouth. She has had lymphadenopathy episodically none lately. She denies skin rashes, sensation changes, or photosensitivity. She has no known family history of autoimmune disease.  Labs reviewed 07/2020 ANA 1:40 cytoplasmic, nuclear, speckled SSA >8.0 SSB negative dsDNA negative Scl-70 negative Smith negative Centromere negative RNP negative Jo-1 negative MyoMarker 3 Panel negative RF negative CCP  negative CK normal ESR 76 CRP normal  Imaging reviewed 07/2020 Echocardiogram No evidence of increased RVSP  06/2020 CT Chest 1. Peripheral honeycombing is noted throughout both lungs, but most prominently seen in the right lung base. Mild bronchiectasis is noted as well in the lower lobes. Reticular densities are noted peripherally throughout both lungs most consistent with scarring. These findings are suggestive of pulmonary fibrosis. 2. Aortic atherosclerosis.   Activities of Daily Living:  Patient reports morning stiffness for 5 minutes.   Patient Reports nocturnal pain.  Difficulty dressing/grooming: Denies Difficulty climbing stairs: Reports Difficulty getting out of chair: Reports Difficulty using hands for taps, buttons, cutlery, and/or writing: Reports  Review of Systems  Constitutional: Positive for fatigue.  HENT: Negative for mouth sores, mouth dryness and nose dryness.   Eyes: Negative for pain, itching, visual disturbance and dryness.  Respiratory: Positive for cough, shortness of breath and difficulty breathing. Negative for hemoptysis.   Cardiovascular: Positive for chest pain. Negative for palpitations and swelling in legs/feet.  Gastrointestinal: Negative for abdominal pain, blood in stool, constipation and diarrhea.  Endocrine: Negative for increased urination.  Genitourinary: Negative for painful urination.  Musculoskeletal: Positive for arthralgias, joint pain, myalgias, morning stiffness and myalgias. Negative for joint swelling, muscle weakness and muscle tenderness.  Skin: Negative for color change, rash and redness.  Allergic/Immunologic: Negative for susceptible to infections.  Neurological: Positive for headaches. Negative for dizziness, numbness, memory loss and weakness.  Hematological: Positive for swollen glands.  Psychiatric/Behavioral: Positive for sleep disturbance. Negative for confusion.    PMFS History:  Patient Active  Problem List    Diagnosis Date Noted  . Positive ANA (antinuclear antibody) 09/09/2020  . Bilateral hand pain 09/09/2020  . Pulmonary fibrosis ([place]) 06/26/2020  . Chronic cough 05/13/2020  . Rash 10/30/2019  . Atrophic vaginitis 01/09/2019  . Menopausal flushing 01/09/2019  . Colon polyp, hyperplastic 01/09/2019  . Type 2 diabetes mellitus without complication, without long-term current use of insulin ([place]) 12/11/2017  . Anemia 12/11/2017  . Insomnia 02/06/2017  . Gastroesophageal reflux disease with esophagitis 02/06/2017  . DDD (degenerative disc disease), lumbosacral 02/06/2017  . Constipation 02/06/2017  . Dysphagia 02/06/2017  . Scoliosis of lumbar spine 02/06/2017  . Systolic murmur 51/11/5850  . Headache 03/01/2012  . Seasonal allergic rhinitis 03/01/2012    Past Medical History:  Diagnosis Date  . Allergic rhinitis   . Allergy   . Chronic back pain 07/20/2018  . Chronic midline low back pain 02/06/2017  . Depression   . Esophageal stricture   . GERD (gastroesophageal reflux disease)   . Inflammatory polyps of colon ([place]) 2010  . Systolic murmur   . UTI (urinary tract infection)     Family History  Problem Relation Age of Onset  . Arthritis Mother   . Thyroid disease Mother   . Heart disease Father   . Hypertension Father   . Diabetes Father   . Arthritis Sister   . Diabetes Brother   . Colon cancer Neg Hx   . Stomach cancer Neg Hx    Past Surgical History:  Procedure Laterality Date  . COLONOSCOPY    . UPPER GASTROINTESTINAL ENDOSCOPY     Social History   Social History Narrative  . Not on file   Immunization History  Administered Date(s) Administered  . Janssen (J&J) SARS-COV-2 Vaccination 01/18/2020  . Tdap 12/08/2017     Objective: Vital Signs: BP 121/68 (BP Location: Right Arm, Patient Position: Sitting, Cuff Size: Small)   Pulse 93   Ht 5' (1.524 m)   Wt 139 lb 9.6 oz (63.3 kg)   BMI 27.26 kg/m    Physical Exam HENT:     Right Ear: External ear  normal.     Left Ear: External ear normal.     Mouth/Throat:     Mouth: Mucous membranes are moist.     Pharynx: Oropharynx is clear.  Eyes:     Conjunctiva/sclera: Conjunctivae normal.  Cardiovascular:     Rate and Rhythm: Normal rate and regular rhythm.  Pulmonary:     Effort: Pulmonary effort is normal.     Breath sounds: Normal breath sounds.  Skin:    General: Skin is warm and dry.  Neurological:     General: No focal deficit present.     Mental Status: She is alert.  Psychiatric:        Mood and Affect: Mood normal.     Musculoskeletal Exam:  Neck full range of motion no tenderness Shoulders full ROM some pain in right shoulder with reaching behind back but no tenderness or swelling, left shoulder normal Elbow, wrist, fingers full range of motion no tenderness or swelling, hard nodule on dorsal side of right 3rd PIP Paraspinal tenderness to palpation over lower back Normal hip internal and external rotation without pain Knees, ankles, MTPs full range of motion no tenderness or swelling  Investigation: No additional findings.  Imaging: XR Cervical Spine 2 or 3 views  Result Date: 08/12/2020 AP lateral cervical spine x-rays demonstrate slight anterolisthesis C4-5.  Disc space narrowing and spurring at C5-6 C6-7.  Normal lordosis is maintained. Impression: Mid cervical spondylosis as described above.  XR Hand 2 View Left  Result Date: 09/09/2020 Left hand x-ray 2 views Radiocarpal joint space appears normal except question changes at radial edge of scaphoid.  MCP joint spaces are normal.  Osteophyte or enthesophyte present at second PIP ulnar edge.  Early osteophytes at second and third DIP joints.  Joint space narrowing of fifth DIP joint.  Bone mineralization suggest generalized osteopenia.  No soft tissue swelling seen. Impression Osteoarthritis changes are seen and generalized osteopenia  XR Hand 2 View Right  Result Date: 09/09/2020 X-ray right hand 2 views  Radiocarpal and carpal joint spaces appear normal.  Normal MCP, PIP joint spaces.  Osteophytes and irregular joint space narrowing present at third and fifth DIPs most noticeably.  No periarticular erosions.  Bone mineralization suggest generalized osteopenia.  No soft tissue swelling seen. 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  This shows old L1 compression fracture unchanged.  Left lumbar curvature apex L3 less than 30 degrees.  2 mm anterolisthesis at L4-5.  Facet arthropathy and disc base narrowing with endplate sclerosis T0-W4. Impression: Left lumbar curvature.  Stable old L1 compression fracture.  Facet arthropathy.  XR Pelvis 1-2 Views  Result Date: 08/12/2020 Pelvis x-ray demonstrates no lytic or sclerotic pelvic lesions.  Hip hips right and left shows no significant degenerative changes negative for acute changes. Impression: Lumbosacral disc degeneration L5-S1 better visualized on lumbar radiographs.  Pelvis and hips negative for significant acute changes.   Recent Labs: Lab Results  Component Value Date   WBC 5.9 06/26/2020   HGB 11.2 (L) 06/26/2020   PLT 221.0 06/26/2020   NA 141 09/09/2020   K 4.4 09/09/2020   CL 104 09/09/2020   CO2 29 09/09/2020   GLUCOSE 96 09/09/2020   BUN 18 09/09/2020   CREATININE 1.08 09/09/2020   BILITOT 0.3 09/09/2020   ALKPHOS 54 09/09/2020   AST 29 09/09/2020   ALT 16 09/09/2020   PROT 8.2 09/09/2020   ALBUMIN 4.5 09/09/2020   CALCIUM 9.6 09/09/2020    Speciality Comments: No specialty comments available.  Procedures:  No procedures performed Allergies: Iodine   Assessment / Plan:     Visit Diagnoses: Positive ANA (antinuclear antibody) Bilateral hand pain - Plan: XR Hand 2 View Right, XR Hand 2 View Left  History and review of systems is unremarkable for evidence of sjogren syndrome or related  disease. Bilateral hand xrays show osteopenia and osteoarthritis without erosive disease. No exocrine gland dysfunction and no other evidence of systemic connective disease at this time.  DDD (degenerative disc disease), lumbosacral  Review of previous xrays demonstrating previous fracture and degenerative disease, no inflammatory changes.  Orders: Orders Placed This Encounter  Procedures  . XR Hand 2 View Right  . XR Hand 2 View Left   No orders of the defined types were placed in this encounter.   Follow-Up Instructions: No follow-ups on file.   Collier Salina, MD  Note - This record has been created using [employer].  Chart creation errors have been sought, but may not always  have been located. Such creation errors do not reflect on  the standard of medical care.

## 2020-09-09 ENCOUNTER — Ambulatory Visit (INDEPENDENT_AMBULATORY_CARE_PROVIDER_SITE_OTHER): Payer: BC Managed Care – PPO | Admitting: Pulmonary Disease

## 2020-09-09 ENCOUNTER — Encounter: Payer: Self-pay | Admitting: Pulmonary Disease

## 2020-09-09 ENCOUNTER — Other Ambulatory Visit: Payer: Self-pay

## 2020-09-09 ENCOUNTER — Ambulatory Visit: Payer: Self-pay

## 2020-09-09 ENCOUNTER — Ambulatory Visit (INDEPENDENT_AMBULATORY_CARE_PROVIDER_SITE_OTHER): Payer: BC Managed Care – PPO | Admitting: Internal Medicine

## 2020-09-09 ENCOUNTER — Encounter: Payer: Self-pay | Admitting: Internal Medicine

## 2020-09-09 VITALS — BP 121/68 | HR 93 | Ht 60.0 in | Wt 139.6 lb

## 2020-09-09 VITALS — BP 124/64 | HR 83 | Temp 97.8°F | Ht 60.0 in | Wt 140.0 lb

## 2020-09-09 DIAGNOSIS — J8410 Pulmonary fibrosis, unspecified: Secondary | ICD-10-CM

## 2020-09-09 DIAGNOSIS — K219 Gastro-esophageal reflux disease without esophagitis: Secondary | ICD-10-CM | POA: Diagnosis not present

## 2020-09-09 DIAGNOSIS — R768 Other specified abnormal immunological findings in serum: Secondary | ICD-10-CM

## 2020-09-09 DIAGNOSIS — K222 Esophageal obstruction: Secondary | ICD-10-CM

## 2020-09-09 DIAGNOSIS — M79641 Pain in right hand: Secondary | ICD-10-CM | POA: Insufficient documentation

## 2020-09-09 DIAGNOSIS — M79642 Pain in left hand: Secondary | ICD-10-CM

## 2020-09-09 DIAGNOSIS — M5137 Other intervertebral disc degeneration, lumbosacral region: Secondary | ICD-10-CM | POA: Diagnosis not present

## 2020-09-09 LAB — COMPREHENSIVE METABOLIC PANEL
ALT: 16 U/L (ref 0–35)
AST: 29 U/L (ref 0–37)
Albumin: 4.5 g/dL (ref 3.5–5.2)
Alkaline Phosphatase: 54 U/L (ref 39–117)
BUN: 18 mg/dL (ref 6–23)
CO2: 29 mEq/L (ref 19–32)
Calcium: 9.6 mg/dL (ref 8.4–10.5)
Chloride: 104 mEq/L (ref 96–112)
Creatinine, Ser: 1.08 mg/dL (ref 0.40–1.20)
GFR: 54.3 mL/min — ABNORMAL LOW (ref >60.00)
Glucose, Bld: 96 mg/dL (ref 70–99)
Potassium: 4.4 mEq/L (ref 3.5–5.1)
Sodium: 141 mEq/L (ref 135–145)
Total Bilirubin: 0.3 mg/dL (ref 0.2–1.2)
Total Protein: 8.2 g/dL (ref 6.0–8.3)

## 2020-09-09 LAB — PULMONARY FUNCTION TEST
DL/VA % pred: 88 %
DL/VA: 3.8 ml/min/mmHg/L
DLCO cor % pred: 61 %
DLCO cor: 10.61 ml/min/mmHg
DLCO unc % pred: 61 %
DLCO unc: 10.61 ml/min/mmHg
FEF 25-75 Post: 2.43 L/sec
FEF 25-75 Pre: 2.06 L/sec
FEF2575-%Change-Post: 17 %
FEF2575-%Pred-Post: 147 %
FEF2575-%Pred-Pre: 125 %
FEV1-%Change-Post: 2 %
FEV1-%Pred-Post: 94 %
FEV1-%Pred-Pre: 91 %
FEV1-Post: 1.55 L
FEV1-Pre: 1.51 L
FEV1FVC-%Change-Post: -2 %
FEV1FVC-%Pred-Pre: 112 %
FEV6-%Change-Post: 5 %
FEV6-%Pred-Post: 88 %
FEV6-%Pred-Pre: 83 %
FEV6-Post: 1.8 L
FEV6-Pre: 1.7 L
FEV6FVC-%Pred-Post: 104 %
FEV6FVC-%Pred-Pre: 104 %
FVC-%Change-Post: 5 %
FVC-%Pred-Post: 85 %
FVC-%Pred-Pre: 80 %
FVC-Post: 1.8 L
FVC-Pre: 1.7 L
Post FEV1/FVC ratio: 86 %
Post FEV6/FVC ratio: 100 %
Pre FEV1/FVC ratio: 89 %
Pre FEV6/FVC Ratio: 100 %
RV % pred: 68 %
RV: 1.28 L
TLC % pred: 68 %
TLC: 3.05 L

## 2020-09-09 NOTE — Patient Instructions (Addendum)
I do not see evidence of autoimmune disease related to your positive blood tests.  You have osteoarthritis and in the back scoliosis that appears related to previous vertebra fracture. I recommend you to take daily vitamin D supplement of 1000 units or 25 mcg daily. You have osteopenia and could benefit with repeat bone density test in the next year to make sure it is not worsening.   Osteoarthritis  Osteoarthritis is a type of arthritis that affects tissue that covers the ends of bones in joints (cartilage). Cartilage acts as a cushion between the bones and helps them move smoothly. Osteoarthritis results when cartilage in the joints gets worn down. Osteoarthritis is sometimes called "wear and tear" arthritis. Osteoarthritis is the most common form of arthritis. It often occurs in older people. It is a condition that gets worse over time (a progressive condition). Joints that are most often affected by this condition are in:  Fingers.  Toes.  Hips.  Knees.  Spine, including neck and lower back. What are the causes? This condition is caused by age-related wearing down of cartilage that covers the ends of bones. What increases the risk? The following factors may make you more likely to develop this condition:  Older age.  Being overweight or obese.  Overuse of joints, such as in athletes.  Past injury of a joint.  Past surgery on a joint.  Family history of osteoarthritis. What are the signs or symptoms? The main symptoms of this condition are pain, swelling, and stiffness in the joint. The joint may lose its shape over time. Small pieces of bone or cartilage may break off and float inside of the joint, which may cause more pain and damage to the joint. Small deposits of bone (osteophytes) may grow on the edges of the joint. Other symptoms may include:  A grating or scraping feeling inside the joint when you move it.  Popping or creaking sounds when you move. Symptoms may  affect one or more joints. Osteoarthritis in a major joint, such as your knee or hip, can make it painful to walk or exercise. If you have osteoarthritis in your hands, you might not be able to grip items, twist your hand, or control small movements of your hands and fingers (fine motor skills). How is this diagnosed? This condition may be diagnosed based on:  Your medical history.  A physical exam.  Your symptoms.  X-rays of the affected joint(s).  Blood tests to rule out other types of arthritis. How is this treated? There is no cure for this condition, but treatment can help to control pain and improve joint function. Treatment plans may include:  A prescribed exercise program that allows for rest and joint relief. You may work with a physical therapist.  A weight control plan.  Pain relief techniques, such as: ? Applying heat and cold to the joint. ? Electric pulses delivered to nerve endings under the skin (transcutaneous electrical nerve stimulation, or TENS). ? Massage. ? Certain nutritional supplements.  NSAIDs or prescription medicines to help relieve pain.  Medicine to help relieve pain and inflammation (corticosteroids). This can be given by mouth (orally) or as an injection.  Assistive devices, such as a brace, wrap, splint, specialized glove, or cane.  Surgery, such as: ? An osteotomy. This is done to reposition the bones and relieve pain or to remove loose pieces of bone and cartilage. ? Joint replacement surgery. You may need this surgery if you have very bad (advanced) osteoarthritis. Follow these  instructions at home: Activity  Rest your affected joints as directed by your health care provider.  Do not drive or use heavy machinery while taking prescription pain medicine.  Exercise as directed. Your health care provider or physical therapist may recommend specific types of exercise, such as: ? Strengthening exercises. These are done to strengthen the muscles  that support joints that are affected by arthritis. They can be performed with weights or with exercise bands to add resistance. ? Aerobic activities. These are exercises, such as brisk walking or water aerobics, that get your heart pumping. ? Range-of-motion activities. These keep your joints easy to move. ? Balance and agility exercises. Managing pain, stiffness, and swelling      If directed, apply heat to the affected area as often as told by your health care provider. Use the heat source that your health care provider recommends, such as a moist heat pack or a heating pad. ? If you have a removable assistive device, remove it as told by your health care provider. ? Place a towel between your skin and the heat source. If your health care provider tells you to keep the assistive device on while you apply heat, place a towel between the assistive device and the heat source. ? Leave the heat on for 20-30 minutes. ? Remove the heat if your skin turns bright red. This is especially important if you are unable to feel pain, heat, or cold. You may have a greater risk of getting burned.  If directed, put ice on the affected joint: ? If you have a removable assistive device, remove it as told by your health care provider. ? Put ice in a plastic bag. ? Place a towel between your skin and the bag. If your health care provider tells you to keep the assistive device on during icing, place a towel between the assistive device and the bag. ? Leave the ice on for 20 minutes, 2-3 times a day. General instructions  Take over-the-counter and prescription medicines only as told by your health care provider.  Maintain a healthy weight. Follow instructions from your health care provider for weight control. These may include dietary restrictions.  Do not use any products that contain nicotine or tobacco, such as cigarettes and e-cigarettes. These can delay bone healing. If you need help quitting, ask your  health care provider.  Use assistive devices as directed by your health care provider.  Keep all follow-up visits as told by your health care provider. This is important. Where to find more information  [employer]culoskeletal [employer] Skin [employer]: www.niams.http://www.myers.net/nih.gov  [employer] Aging: https://walker.com/www.nia.nih.gov  American College of Rheumatology: www.rheumatology.org Contact a health care provider if:  Your skin turns red.  You develop a rash.  You have pain that gets worse.  You have a fever along with joint or muscle aches. Get help right away if:  You lose a lot of weight.  You suddenly lose your appetite.  You have night sweats. Summary  Osteoarthritis is a type of arthritis that affects tissue covering the ends of bones in joints (cartilage).  This condition is caused by age-related wearing down of cartilage that covers the ends of bones.  The main symptom of this condition is pain, swelling, and stiffness in the joint.  There is no cure for this condition, but treatment can help to control pain and improve joint function.    Osteopenia  Osteopenia is a loss of thickness (density) inside of  the bones. Another name for osteopenia is low bone mass. Mild osteopenia is a normal part of aging. It is not a disease, and it does not cause symptoms. However, if you have osteopenia and continue to lose bone mass, you could develop a condition that causes the bones to become thin and break more easily (osteoporosis). You may also lose some height, have back pain, and have a stooped posture. Although osteopenia is not a disease, making changes to your lifestyle and diet can help to prevent osteopenia from developing into osteoporosis. What are the causes? Osteopenia is caused by loss of calcium in the bones.  Bones are constantly changing. Old bone cells are continually being replaced with new bone cells. This process builds new bone. The mineral calcium  is needed to build new bone and maintain bone density. Bone density is usually highest around age 7. After that, most people's bodies cannot replace all the bone they have lost with new bone. What increases the risk? You are more likely to develop this condition if:  You are older than age 47.  You are a woman who went through menopause early.  You have a long illness that keeps you in bed.  You do not get enough exercise.  You lack certain nutrients (malnutrition).  You have an overactive thyroid gland (hyperthyroidism).  You smoke.  You drink a lot of alcohol.  You are taking medicines that weaken the bones, such as steroids. What are the signs or symptoms? This condition does not cause any symptoms. You may have a slightly higher risk for bone breaks (fractures), so getting fractures more easily than normal may be an indication of osteopenia. How is this diagnosed? Your health care provider can diagnose this condition with a special type of X-ray exam that measures bone density (dual-energy X-ray absorptiometry, DEXA). This test can measure bone density in your hips, spine, and wrists. Osteopenia has no symptoms, so this condition is usually diagnosed after a routine bone density screening test is done for osteoporosis. This routine screening is usually done for:  Women who are age 6 or older.  Men who are age 70 or older. If you have risk factors for osteopenia, you may have the screening test at an earlier age. How is this treated? Making dietary and lifestyle changes can lower your risk for osteoporosis. If you have severe osteopenia that is close to becoming osteoporosis, your health care provider may prescribe medicines and dietary supplements such as calcium and vitamin D. These supplements help to rebuild bone density. Follow these instructions at home:   Take over-the-counter and prescription medicines only as told by your health care provider. These include vitamins  and supplements.  Eat a diet that is high in calcium and vitamin D. ? Calcium is found in dairy products, beans, salmon, and leafy green vegetables like spinach and broccoli. ? Look for foods that have vitamin D and calcium added to them (fortified foods), such as orange juice, cereal, and bread.  Do 30 or more minutes of a weight-bearing exercise every day, such as walking, jogging, or playing a sport. These types of exercises strengthen the bones.  Take precautions at home to lower your risk of falling, such as: ? Keeping rooms well-lit and free of clutter, such as cords. ? Installing safety rails on stairs. ? Using rubber mats in the bathroom or other areas that are often wet or slippery.  Do not use any products that contain nicotine or tobacco, such as cigarettes  and e-cigarettes. If you need help quitting, ask your health care provider.  Avoid alcohol or limit alcohol intake to no more than 1 drink a day for nonpregnant women and 2 drinks a day for men. One drink equals 12 oz of beer, 5 oz of wine, or 1 oz of hard liquor.  Keep all follow-up visits as told by your health care provider. This is important. Contact a health care provider if:  You have not had a bone density screening for osteoporosis and you are: ? A woman, age 74 or older. ? A man, age 59 or older.  You are a postmenopausal woman who has not had a bone density screening for osteoporosis.  You are older than age 66 and you want to know if you should have bone density screening for osteoporosis. Summary  Osteopenia is a loss of thickness (density) inside of the bones. Another name for osteopenia is low bone mass.  Osteopenia is not a disease, but it may increase your risk for a condition that causes the bones to become thin and break more easily (osteoporosis).  You may be at risk for osteopenia if you are older than age 86 or if you are a woman who went through early menopause.  Osteopenia does not cause any  symptoms, but it can be diagnosed with a bone density screening test.  Dietary and lifestyle changes are the first treatment for osteopenia. These may lower your risk for osteoporosis.

## 2020-09-09 NOTE — Progress Notes (Signed)
Sandra Curtis    [id number]    1956/06/10  Primary Care Physician:Nche, Bonna Gains, NP  Referring Physician: [name], NP [address] [place],  [place] [address]  Chief complaint: Follow-up for pulmonary fibrosis   HPI: 64 year old with history of allergies, GERD, esophageal stricture Complains of chronic cough for several years, worsening dyspnea on exertion. She had a CT scan done by Dr. Elease Etienne, primary care showing pulmonary fibrosis and has been referred for further evaluation.  Previously followed by Dr. Russella Dar, GI with EGD in 2004 showing esophageal stricture secondary to acid reflux.  She has not followed up since then Started on Nexium in August 2021 by primary care.  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  No mold, hot tub, Jacuzzi.  No feather pillows or comforters Smoking history: 18-pack-year smoker.  Quit 1 month ago Travel history: No significant travel history Relevant family history: No significant family issue of lung disease  Interval history: Has followed up with Dr. Russella Dar, GI who has increased her Protonix to 40 mg twice daily and scheduled EGD Has also seen Dr. Dimple Casey, rheumatology for evaluation of forced of ANA and SSA.  He feels she does not have any signs of autoimmune disease.  Continues to have chronic dyspnea on exertion.  Outpatient Encounter Medications as of 09/09/2020  Medication Sig  . acetaminophen (TYLENOL 8 HOUR ARTHRITIS PAIN) 650 MG CR tablet Take 1 tablet (650 mg total) by mouth every 8 (eight) hours as needed for pain.  Marland Kitchen albuterol (VENTOLIN HFA) 108 (90 Base) MCG/ACT inhaler Inhale 1-2 puffs into the lungs every 6 (six) hours as needed.  . benzonatate (TESSALON) 100 MG capsule Take 1 capsule (100 mg total) by mouth every 8 (eight) hours.  . diclofenac Sodium (VOLTAREN) 1 % GEL  Apply 2 g topically 4 (four) times daily. (Patient taking differently: Apply 2 g topically daily. )  . gabapentin (NEURONTIN) 100 MG capsule Take 3 capsules (300 mg total) by mouth at bedtime.  . pantoprazole (PROTONIX) 40 MG tablet Take 1 tablet (40 mg total) by mouth 2 (two) times daily before a meal.  . Spacer/Aero-Holding Chambers (AEROCHAMBER PLUS FLO-VU W/MASK) MISC USE AS DIRECTED ONCE FOR 1 DOSE  . sucralfate (CARAFATE) 1 GM/10ML suspension Take 10 mLs (1 g total) by mouth 4 (four) times daily -  with meals and at bedtime.   No facility-administered encounter medications on file as of 09/09/2020.    Physical Exam: Blood pressure 124/64, pulse 83, temperature 97.8 F (36.6 C), temperature source Skin, height 5' (1.524 m), weight 140 lb (63.5 kg), SpO2 98 %. Gen:      No acute distress HEENT:  EOMI, sclera anicteric Neck:     No masses; no thyromegaly Lungs:   Bibasal crackles CV:         Regular rate and rhythm; no murmurs Abd:      + bowel sounds; soft, non-tender; no palpable masses, no distension Ext:    No edema; adequate peripheral perfusion Skin:      Warm and dry; no rash Neuro: alert and oriented x 3 Psych: normal mood and affect  Data Reviewed: Imaging: Chest x-ray 05/15/2020-diffuse interstitial opacities CT chest 06/17/2020-reticulation with traction bronchiectasis, honeycombing most prominent in the right lung base.  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  Will review her scan and the ILD multidisciplinary conference. She does have mild arthritis symptoms which may be secondary to osteoarthritis but does endorse some morning stiffness and small joint pain.  She had  some occupational exposures to textile fiber and dust but no asbestos No evidence of autoimmune process, connective tissue disease per rheumatology evaluation  History notable for significant GERD symptoms, reflux and esophageal stricture.  Reflux may partly explain pulmonary fibrosis especially as it is asymmetric.  She has read established with GI and is scheduled for EGD  We will start her on Esbriet given UIP pattern pulmonary fibrosis Check CMP  Plan/Recommendations: Multidisciplinary ILD discretion Start Esbriet. Check CMP  Chilton Greathouse MD [facility] 09/09/2020, 11:16 AM  CC: Nche, Bonna Gains, NP

## 2020-09-09 NOTE — Patient Instructions (Signed)
We will paperwork for medication called Esbriet which will slow the progression of scarring down I will review your case at our conference to get an opinion of other doctors Check comprehensive metabolic panel today  Follow-up in 1 to 2 months.

## 2020-09-09 NOTE — Addendum Note (Signed)
Addended by: Demetrio Lapping E on: 09/09/2020 11:36 AM   Modules accepted: Orders

## 2020-09-09 NOTE — Progress Notes (Signed)
Full PFT performed today. °

## 2020-09-10 ENCOUNTER — Telehealth: Payer: Self-pay | Admitting: Pharmacy Technician

## 2020-09-10 ENCOUNTER — Other Ambulatory Visit: Payer: Self-pay

## 2020-09-10 NOTE — Telephone Encounter (Signed)
Received New start paperwork for ESBRIET. Will update as we work through the benefits process.  

## 2020-09-11 ENCOUNTER — Encounter: Payer: BC Managed Care – PPO | Admitting: Nurse Practitioner

## 2020-09-11 NOTE — Telephone Encounter (Signed)
Submitted a Prior Authorization request to BCBS/ [employer] for ESBRIET Tabs via [employer] Portal. Will update once we receive a response.  PA# [id number] RxBenefits # [phone].

## 2020-09-15 ENCOUNTER — Encounter: Payer: Self-pay | Admitting: *Deleted

## 2020-09-16 MED ORDER — ESBRIET 267 MG PO TABS: ORAL_TABLET | ORAL | 0 refills | Status: AC

## 2020-09-16 MED ORDER — ESBRIET 267 MG PO TABS: 801.0000 mg | ORAL_TABLET | Freq: Three times a day (TID) | ORAL | 11 refills | Status: DC

## 2020-09-16 NOTE — Telephone Encounter (Signed)
Pt's loading dose Rx and maintenance dose Rx have both been sent to [facility] for pt. Nothing further needed.

## 2020-09-16 NOTE — Telephone Encounter (Signed)
Received notification from [place] regarding a prior authorization for ESBRIET. Approval letter with authorization dates will be faxed to office.  Authorization # [id number] Per plan, patient must fill through [facility]. Patient's insurance is under the 1st name Sandra Curtis.  Please send in prescriptions for Esbriet loading doses and maintenance dose to [facility].   Patient has been enrolled for an Esbriet copay card which will be mailed to patient's home with her welcome kit.  Reference number: [id number]  Esbriet copay card [phone].

## 2020-09-18 ENCOUNTER — Telehealth: Payer: Self-pay | Admitting: Pulmonary Disease

## 2020-09-18 NOTE — Telephone Encounter (Signed)
ATC Patient. LM to call back.  Per Pharmacy Team- 09/18/20  Received notification from RX Benefits regarding a prior authorization for ESBRIET. Approval letter with authorization dates will be faxed to office.  Authorization # [id number] Per plan, patient must fill through [place] [facility]. Patient's insurance is under the 1st name [name].  Please send in prescriptions for Esbriet loading doses and maintenance dose to [facility].   Patient has been enrolled for an Esbriet copay card which will be mailed to patient's home with her welcome kit.  Reference number: [id number]  Esbriet copay card [phone].

## 2020-09-21 NOTE — Telephone Encounter (Signed)
[phone] pt calling back said best time to call is morning

## 2020-09-21 NOTE — Telephone Encounter (Signed)
Called Esbriet and got patient's copay card information:  [id number]  ID# [id number]  Called [facility] and provided information to process for she was able process for $5.00 copay  Rep tried to reach out to patient to schedule. No answer.  Called patient, left message that Ricki Rodriguez is a specialty medication and per insurance she must fill though [facility]. Provided phone number to call to set up shipment.  Phone# [phone]

## 2020-09-22 ENCOUNTER — Ambulatory Visit: Payer: Self-pay | Admitting: Pulmonary Disease

## 2020-09-22 DIAGNOSIS — J8410 Pulmonary fibrosis, unspecified: Secondary | ICD-10-CM

## 2020-09-24 ENCOUNTER — Telehealth: Payer: Self-pay | Admitting: Orthopaedic Surgery

## 2020-09-24 NOTE — Telephone Encounter (Signed)
Patient submitted medical release form, [place] [employer] disability forms, and $25.00 check payment to [employer]. Accepted 09/24/20.

## 2020-10-06 ENCOUNTER — Encounter: Payer: Self-pay | Admitting: Orthopaedic Surgery

## 2020-10-06 ENCOUNTER — Ambulatory Visit (INDEPENDENT_AMBULATORY_CARE_PROVIDER_SITE_OTHER): Payer: BC Managed Care – PPO | Admitting: Orthopaedic Surgery

## 2020-10-06 VITALS — BP 138/73 | HR 84 | Ht 60.0 in | Wt 136.0 lb

## 2020-10-06 DIAGNOSIS — M419 Scoliosis, unspecified: Secondary | ICD-10-CM | POA: Diagnosis not present

## 2020-10-06 DIAGNOSIS — M5137 Other intervertebral disc degeneration, lumbosacral region: Secondary | ICD-10-CM | POA: Diagnosis not present

## 2020-10-06 NOTE — Progress Notes (Signed)
Office Visit Note   Patient: Sandra Curtis           Date of Birth: January 06, 1956           MRN: [id number] Visit Date: 10/06/2020              Requested by: [name], NP [address] [place],  [place] [address] PCP: [name], NP   Assessment & Plan: Visit Diagnoses:  1. DDD (degenerative disc disease), lumbosacral   2. Scoliosis of lumbar spine, unspecified scoliosis type   3.     Healed L1 compression fracture from 2019  Plan: We discussed with her she could look for some activity where she does not to do as much repetitive bending.  She is wanting to make it age 16 which would be June.  I discussed with her we could obtain a new MRI scan and she can call if she like to proceed with MRI scan see if there is something that shows compression or an area that we could do some treatment to improve her symptoms.  She will call us back if she like to proceed with a repeat lumbar MRI scan.  Follow-Up Instructions: No follow-ups on file.   Orders:  No orders of the defined types were placed in this encounter.  No orders of the defined types were placed in this encounter.     Procedures: No procedures performed   Clinical Data: No additional findings.   Subjective: Chief Complaint  Patient presents with  . Lower Back - Pain    HPI 64 year old female returns with past history of 2019 compression fracture L1 which is healed.  MRI scan November 2019 showed some mild narrowing at L4-5 with 3 mm of anterolisthesis and some degenerative facet changes.  L1 compression loss of 30% height without stenosis.  Patient's been out of work for holiday and some with the pandemic.  Patient states normally on a job she does a lot of bending and stooping.  Bending tends to bother her back patient states that when she does not do work activity her back feels better.  She does have significant pulmonary fibrosis longstanding. No chills or fever.  No associated  bowel bladder symptoms. Review of Systems   Objective: Vital Signs: BP 138/73   Pulse 84   Ht 5' (1.524 m)   Wt 136 lb (61.7 kg)   BMI 26.56 kg/m   Physical Exam Constitutional:      Appearance: She is well-developed.  HENT:     Head: Normocephalic.     Right Ear: External ear normal.     Left Ear: External ear normal.  Eyes:     Pupils: Pupils are equal, round, and reactive to light.  Neck:     Thyroid: No thyromegaly.     Trachea: No tracheal deviation.  Cardiovascular:     Rate and Rhythm: Normal rate.  Pulmonary:     Effort: Pulmonary effort is normal.  Abdominal:     Palpations: Abdomen is soft.  Skin:    General: Skin is warm and dry.  Neurological:     Mental Status: She is alert and oriented to person, place, and time.  Psychiatric:        Mood and Affect: Mood and affect normal.        Behavior: Behavior normal.     Ortho Exam patient able ambulate she is can heel and toe walk negative straight leg raising 90 degrees.  She  has tenderness over the left sacroiliac joint region.  No sciatic notch tenderness no trochanteric bursal tenderness.  Anterior tib EHL is strong quads are strong no atrophy lower extremities.  Distal pulses are normal.  Specialty Comments:  No specialty comments available.  Imaging: CLINICAL DATA:  Left low back pain over the last month. L1 compression fracture suspected by radiography.  EXAM: MRI LUMBAR SPINE WITHOUT CONTRAST  TECHNIQUE: Multiplanar, multisequence MR imaging of the lumbar spine was performed. No intravenous contrast was administered.  COMPARISON:  Radiography 07/19/2018 and 08/08/2018  FINDINGS: Segmentation:  5 lumbar type vertebral bodies.  Alignment: Curvature convex to the left with the apex at L2-3. 2 mm anterolisthesis L4-5.  Vertebrae: Acute or subacute superior endplate fracture at L1 with loss of height of 30%. Mild posterior bowing of the posterosuperior margin of the vertebral body  towards the left, but without compressive stenosis. Mild edema at the right inferior endplate of T12 without evidence of larger fracture.  Conus medullaris and cauda equina: Conus extends to the L1 level. Conus and cauda equina appear normal.  Paraspinal and other soft tissues: Negative.  Right renal cyst.  Disc levels:  T11-12: Central disc herniation indents the ventral subarachnoid space but does not affect the neural structures.  T12-L1: Bulging of the disc more towards the left. Mild posterior bowing of the posterosuperior margin of the L1 vertebral body more towards the left. No compressive stenosis at this level.  L1-2: Mild disc bulge.  No stenosis.  L2-3: Moderate disc bulge.  No stenosis.  L3-4: Moderate disc bulge. Mild facet and ligamentous hypertrophy. Mild stenosis of both lateral recesses without definite neural compression.  L4-5: Bilateral facet arthropathy with 3 mm of anterolisthesis. This could worsen with standing or flexion. Bulging of the disc. Mild narrowing of the lateral recesses without definite neural compression.  L5-S1: Endplate osteophytes and shallow protrusion of the disc. Facet degeneration and hypertrophy. Stenosis of both subarticular lateral recesses and neural foramina, left worse than right. Neural compression could occur at this level, particularly on the left.  IMPRESSION: Acute or subacute superior endplate fracture at L1 with loss of height of 30%. This has progressed slightly from the initial radiography of 07/19/2018. Mild posterior bowing of the posterosuperior margin of the vertebral body but no compressive stenosis. Minimal inferior endplate edema at M42.  Non-compressive degenerative changes at multiple levels as described above.  Facet arthropathy at L4-5 with 3 mm of anterolisthesis. This could worsen with standing or flexion. Bulging of the disc. Mild stenosis of the lateral recesses that could worsen  with standing or flexion.  Chronic degenerative change at L5-S1 with endplate osteophytes and shallow protrusion of the disc. Lateral recess and foraminal narrowing left worse than right. Neural compression could occur on the left, but the findings are probably chronic.   Electronically Signed   By: Paulina Fusi M.D.   On: 08/18/2018 08:00   PMFS History: Patient Active Problem List   Diagnosis Date Noted  . Positive ANA (antinuclear antibody) 09/09/2020  . Bilateral hand pain 09/09/2020  . Pulmonary fibrosis (HCC) 06/26/2020  . Chronic cough 05/13/2020  . Rash 10/30/2019  . Atrophic vaginitis 01/09/2019  . Menopausal flushing 01/09/2019  . Colon polyp, hyperplastic 01/09/2019  . Type 2 diabetes mellitus without complication, without long-term current use of insulin (HCC) 12/11/2017  . Anemia 12/11/2017  . Insomnia 02/06/2017  . Gastroesophageal reflux disease with esophagitis 02/06/2017  . DDD (degenerative disc disease), lumbosacral 02/06/2017  . Constipation 02/06/2017  .  Dysphagia 02/06/2017  . Scoliosis of lumbar spine 02/06/2017  . Systolic murmur 10/02/2013  . Headache 03/01/2012  . Seasonal allergic rhinitis 03/01/2012   Past Medical History:  Diagnosis Date  . Allergic rhinitis   . Allergy   . Chronic back pain 07/20/2018  . Chronic midline low back pain 02/06/2017  . Depression   . Esophageal stricture   . GERD (gastroesophageal reflux disease)   . Inflammatory polyps of colon (HCC) 2010  . Systolic murmur   . UTI (urinary tract infection)     Family History  Problem Relation Age of Onset  . Arthritis Mother   . Thyroid disease Mother   . Heart disease Father   . Hypertension Father   . Diabetes Father   . Arthritis Sister   . Diabetes Brother   . Colon cancer Neg Hx   . Stomach cancer Neg Hx     Past Surgical History:  Procedure Laterality Date  . COLONOSCOPY    . UPPER GASTROINTESTINAL ENDOSCOPY     Social History   Occupational  History  . Occupation: Biomedical engineer: paperworks  Tobacco Use  . Smoking status: Former Smoker    Packs/day: 0.50    Years: 35.00    Pack years: 17.50    Types: Cigarettes    Quit date: 04/12/2018    Years since quitting: 2.4  . Smokeless tobacco: Never Used  Vaping Use  . Vaping Use: Never used  Substance and Sexual Activity  . Alcohol use: Not Currently  . Drug use: No  . Sexual activity: Not Currently

## 2020-10-07 ENCOUNTER — Encounter: Payer: BC Managed Care – PPO | Admitting: Nurse Practitioner

## 2020-10-14 ENCOUNTER — Telehealth: Payer: Self-pay | Admitting: Pulmonary Disease

## 2020-10-14 NOTE — Progress Notes (Signed)
   Interstitial Lung Disease Multidisciplinary Conference   Destynie Toomey    MRN [id number]    DOB 10-23-1955  Primary Care Physician:Nche, Bonna Gains, NP  Referring Physician: Chilton Greathouse MD  Time of Conference: 7.30am- 8.30am Date of conference: 09/22/2020 Location of Conference: -  Virtual  Participating Pulmonary: Dr. Kalman Shan, MD,  Dr Chilton Greathouse, MD Pathology: Dr Holley Bouche, MD Radiology:Dr Lauralyn Primes Others  Brief History:  65 year old with history of allergies, GERD, esophageal stricture Complains of chronic cough for several years, worsening dyspnea on exertion. She had a CT scan done by Dr. Elease Etienne, primary care showing pulmonary fibrosis   Serology: ANA 06/27/2019 1:40, cytoplasmic, rheumatoid factor less than 14 ANA 07/16/2020-1:40, SSA greater than 8  MDD discussion of CT scan   06/17/2020 Reticulation with traction bronchiectasis, honeycombing most prominent in the right lung base.  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  Pattern is not suggestive of chronic aspiration as she has fibrosis and ILD changes in nondependent areas.   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 Dimple Casey, rheumatology for evaluation of elevated ANA and SSA.  He feels she does not have any signs of autoimmune disease.  Agree with starting Esbriet antifibrotic therapy.  Time Spent in preparation and discussion:  > 30 min    SIGNATURE   Sunny Aguon MD [facility] Please see [email or web address] for pager details.  10/14/2020, 10:41 AM

## 2020-10-14 NOTE — Telephone Encounter (Signed)
lmtcb for pt.  

## 2020-10-15 NOTE — Telephone Encounter (Signed)
Lmtcb for pt.  

## 2020-10-20 NOTE — Telephone Encounter (Signed)
Due to unsuccessful attempts to reach pt with no return call this message will be closed per triage protocol.

## 2020-10-26 ENCOUNTER — Encounter: Payer: Self-pay | Admitting: Gastroenterology

## 2020-10-27 ENCOUNTER — Encounter: Payer: BC Managed Care – PPO | Admitting: Gastroenterology

## 2020-10-27 ENCOUNTER — Ambulatory Visit (AMBULATORY_SURGERY_CENTER): Payer: BC Managed Care – PPO | Admitting: Gastroenterology

## 2020-10-27 ENCOUNTER — Other Ambulatory Visit: Payer: Self-pay

## 2020-10-27 ENCOUNTER — Telehealth: Payer: Self-pay | Admitting: Gastroenterology

## 2020-10-27 ENCOUNTER — Encounter: Payer: Self-pay | Admitting: Internal Medicine

## 2020-10-27 VITALS — BP 119/60 | HR 71 | Temp 97.2°F | Resp 22 | Ht 59.0 in | Wt 136.0 lb

## 2020-10-27 DIAGNOSIS — K222 Esophageal obstruction: Secondary | ICD-10-CM | POA: Diagnosis not present

## 2020-10-27 DIAGNOSIS — K209 Esophagitis, unspecified without bleeding: Secondary | ICD-10-CM

## 2020-10-27 DIAGNOSIS — K2100 Gastro-esophageal reflux disease with esophagitis, without bleeding: Secondary | ICD-10-CM | POA: Diagnosis present

## 2020-10-27 DIAGNOSIS — R1310 Dysphagia, unspecified: Secondary | ICD-10-CM

## 2020-10-27 DIAGNOSIS — K449 Diaphragmatic hernia without obstruction or gangrene: Secondary | ICD-10-CM

## 2020-10-27 MED ORDER — SODIUM CHLORIDE 0.9 % IV SOLN: 500.0000 mL | Freq: Once | INTRAVENOUS | Status: DC

## 2020-10-27 NOTE — Patient Instructions (Signed)
Please read handouts provided. Clear liquid diet today from 12 pm to 2 pm, then soft diet for the rest of todayResume prior diet tomorrow. Antireflux measure long term. 4 inch bed blocks long term. Continue present medications, including pantoprazole 40 mg twice daily. Take 30 minutes before breakfast and dinner. Await pathology results. Return to GI clinic in 6 weeks.      YOU HAD AN ENDOSCOPIC PROCEDURE TODAY AT THE [facility]:   Refer to the procedure report that was given to you for any specific questions about what was found during the examination.  If the procedure report does not answer your questions, please call your gastroenterologist to clarify.  If you requested that your care partner not be given the details of your procedure findings, then the procedure report has been included in a sealed envelope for you to review at your convenience later.  YOU SHOULD EXPECT: Some feelings of bloating in the abdomen. Passage of more gas than usual.  Walking can help get rid of the air that was put into your GI tract during the procedure and reduce the bloating. If you had a lower endoscopy (such as a colonoscopy or flexible sigmoidoscopy) you may notice spotting of blood in your stool or on the toilet paper. If you underwent a bowel prep for your procedure, you may not have a normal bowel movement for a few days.  Please Note:  You might notice some irritation and congestion in your nose or some drainage.  This is from the oxygen used during your procedure.  There is no need for concern and it should clear up in a day or so.  SYMPTOMS TO REPORT IMMEDIATELY:    Following upper endoscopy (EGD)  Vomiting of blood or coffee ground material  New chest pain or pain under the shoulder blades  Painful or persistently difficult swallowing  New shortness of breath  Fever of 100F or higher  Black, tarry-looking stools  For urgent or emergent issues, a gastroenterologist can be  reached at any hour by calling [phone]. Do not use MyChart messaging for urgent concerns.    DIET:  Drink plenty of fluids but you should avoid alcoholic beverages for 24 hours.  ACTIVITY:  You should plan to take it easy for the rest of today and you should NOT DRIVE or use heavy machinery until tomorrow (because of the sedation medicines used during the test).    FOLLOW UP: Our staff will call the number listed on your records 48-72 hours following your procedure to check on you and address any questions or concerns that you may have regarding the information given to you following your procedure. If we do not reach you, we will leave a message.  We will attempt to reach you two times.  During this call, we will ask if you have developed any symptoms of COVID 19. If you develop any symptoms (ie: fever, flu-like symptoms, shortness of breath, cough etc.) before then, please call [phone].  If you test positive for Covid 19 in the 2 weeks post procedure, please call and report this information to [place].    If any biopsies were taken you will be contacted by phone or by letter within the next 1-3 weeks.  Please call us at [phone] if you have not heard about the biopsies in 3 weeks.    SIGNATURES/CONFIDENTIALITY: You and/or your care partner have signed paperwork which will be entered into your electronic medical record.  These signatures attest  to the fact that that the information above on your After Visit Summary has been reviewed and is understood.  Full responsibility of the confidentiality of this discharge information lies with you and/or your care-partner. 

## 2020-10-27 NOTE — Progress Notes (Signed)
To PACU, VSS. Report to [email or web address] 

## 2020-10-27 NOTE — Op Note (Signed)
[facility] Patient Name: Sandra MoloneyHenrietta Curtis Procedure Date: 10/27/2020 11:01 AM MRN: [id number] Endoscopist: Meryl DareMalcolm T Jasmin Winberry , MD Age: 89 Referring MD:  Date of Birth: 03/24/1956 Gender: Female Account #: [id number] Procedure:                Upper GI endoscopy Indications:              Dysphagia, Gastroesophageal reflux disease Medicines:                Monitored Anesthesia Care Procedure:                Pre-Anesthesia Assessment:                           - Prior to the procedure, a History and Physical                            was performed, and patient medications and                            allergies were reviewed. The patient's tolerance of                            previous anesthesia was also reviewed. The risks                            and benefits of the procedure and the sedation                            options and risks were discussed with the patient.                            All questions were answered, and informed consent                            was obtained. Prior Anticoagulants: The patient has                            taken no previous anticoagulant or antiplatelet                            agents. ASA Grade Assessment: III - A patient with                            severe systemic disease. After reviewing the risks                            and benefits, the patient was deemed in                            satisfactory condition to undergo the procedure.                           After obtaining informed consent, the endoscope was  passed under direct vision. Throughout the                            procedure, the patient's blood pressure, pulse, and                            oxygen saturations were monitored continuously. The                            Endoscope was introduced through the mouth, and                            advanced to the second part of duodenum. The upper                            GI  endoscopy was accomplished without difficulty.                            The patient tolerated the procedure well. Scope In: Scope Out: Findings:                 LA Grade C (one or more mucosal breaks continuous                            between tops of 2 or more mucosal folds, less than                            75% circumference) esophagitis with no bleeding was                            found in the distal esophagus.                           Localized, white plaques were found in the mid                            esophagus. Biopsies were taken with a cold forceps                            for histology.                           One benign-appearing, intrinsic moderate stenosis                            was found 31 cm from the incisors. This stenosis                            measured 1.3 cm (inner diameter) x less than one cm                            (in length). The stenosis was traversed. A  guidewire was placed and the scope was withdrawn.                            Dilations were performed with Savary dilators with                            mild resistance at 15 mm, 16 mm and 17 mm. Small                            amount of heme on last dilator.                           The exam of the esophagus was otherwise normal.                           A medium-sized hiatal hernia was present.                           The exam of the stomach was otherwise normal.                           The duodenal bulb and second portion of the                            duodenum were normal. Complications:            No immediate complications. Estimated Blood Loss:     Estimated blood loss was minimal. Impression:               - LA Grade C reflux esophagitis with no bleeding.                           - Esophageal plaques were found, suspicious for                            candidiasis. Biopsied.                           - Benign-appearing esophageal  stenosis. Dilated.                           - Medium-sized hiatal hernia.                           - Normal duodenal bulb and second portion of the                            duodenum. Recommendation:           - Patient has a contact number available for                            emergencies. The signs and symptoms of potential                            delayed complications were discussed with  the                            patient. Return to normal activities tomorrow.                            Written discharge instructions were provided to the                            patient.                           - Clear liquid diet for 2 hours then advance as                            tolerated to soft diet today.                           - Resume prior diet tomorrow.                           - Closely follow all antireflux measures long term.                           - 4 inch bedblocks long term.                           - Continue present medications including                            pantoprazole 40 mg po bid taken about 30 minutes ac                            breakfast and dinner.                           - Await pathology results.                           - Return to GI office in 6 weeks. Meryl Dare, MD 10/27/2020 11:42:49 AM This report has been signed electronically.

## 2020-10-27 NOTE — Telephone Encounter (Signed)
Initiated PA with Rx benefits. Will await response.

## 2020-10-27 NOTE — Progress Notes (Signed)
Called to room to assist during endoscopic procedure.  Patient ID and intended procedure confirmed with present staff. Received instructions for my participation in the procedure from the performing physician.  

## 2020-10-27 NOTE — Telephone Encounter (Signed)
Essie from [employer] is requesting a preauthorization on the pt's pantoprazole.   CB [id number] Website: rxb.promptpa.com

## 2020-10-28 NOTE — Telephone Encounter (Signed)
Received fax PA approval for pantoprazole 40 mg. It has been approved from 10/28/20-10/27/21.

## 2020-10-29 ENCOUNTER — Telehealth: Payer: Self-pay | Admitting: *Deleted

## 2020-10-29 ENCOUNTER — Telehealth: Payer: Self-pay

## 2020-10-29 NOTE — Telephone Encounter (Signed)
No answer for post procedure call back. Left message for patient to call with questions or concerns. 

## 2020-10-29 NOTE — Telephone Encounter (Signed)
Called [phone] and left a message we tried to reach pt for a follow up call. maw

## 2020-11-02 ENCOUNTER — Telehealth: Payer: Self-pay | Admitting: Pulmonary Disease

## 2020-11-02 DIAGNOSIS — Z5181 Encounter for therapeutic drug level monitoring: Secondary | ICD-10-CM

## 2020-11-02 NOTE — Telephone Encounter (Signed)
Called and spoke with Patient.  Patient stated she has been on Esbriet for about 6 weeks.  Patient stated she is having headaches,dizzyness, and feeling fatigue. Patient stated she stands at work and gets dizzy while trying to work.  Patient stated she has also noticed increased sneezing, runny nose, and feels her sinuses are more open. Patient stated she spoke with pharmacy and was told she is having side effects from Esbriet. Patient is concerned Ricki Rodriguez is effecting her at work and is scared she is going to get fired. Patient is scheduled to follow up 11/24/20, with Dr. Isaiah Serge. Patient is requesting dose change, medication change, or additional medication to help her feel better.   Message routed to Dr. Isaiah Serge to advise on Esbriet

## 2020-11-03 NOTE — Telephone Encounter (Signed)
ATC patient with Dr. Daneil Dan recs no answer and mailbox full so unable to leave message. Will try again tomorrow

## 2020-11-03 NOTE — Telephone Encounter (Signed)
Please order CMP as she needs liver panel checked. Ask her to hold the Esbriet for now. We will discuss alternatives at time of return visit.

## 2020-11-04 ENCOUNTER — Encounter: Payer: Self-pay | Admitting: Gastroenterology

## 2020-11-05 NOTE — Telephone Encounter (Signed)
Patient is returning phone call. Patient phone number is [phone].

## 2020-11-05 NOTE — Telephone Encounter (Signed)
Spoke with patient. She is aware of the lab order and holding Esbriet until 11/24/20. She will come by the office tomorrow for labs.   Nothing further needed at time of call.

## 2020-11-06 ENCOUNTER — Other Ambulatory Visit (INDEPENDENT_AMBULATORY_CARE_PROVIDER_SITE_OTHER): Payer: BC Managed Care – PPO

## 2020-11-06 DIAGNOSIS — Z5181 Encounter for therapeutic drug level monitoring: Secondary | ICD-10-CM | POA: Diagnosis not present

## 2020-11-06 LAB — COMPREHENSIVE METABOLIC PANEL
ALT: 11 U/L (ref 0–35)
AST: 23 U/L (ref 0–37)
Albumin: 4.3 g/dL (ref 3.5–5.2)
Alkaline Phosphatase: 67 U/L (ref 39–117)
BUN: 16 mg/dL (ref 6–23)
CO2: 30 mEq/L (ref 19–32)
Calcium: 9.8 mg/dL (ref 8.4–10.5)
Chloride: 104 mEq/L (ref 96–112)
Creatinine, Ser: 1.02 mg/dL (ref 0.40–1.20)
GFR: 58.09 mL/min — ABNORMAL LOW (ref >60.00)
Glucose, Bld: 88 mg/dL (ref 70–99)
Potassium: 4.4 mEq/L (ref 3.5–5.1)
Sodium: 139 mEq/L (ref 135–145)
Total Bilirubin: 0.3 mg/dL (ref 0.2–1.2)
Total Protein: 7.8 g/dL (ref 6.0–8.3)

## 2020-11-12 ENCOUNTER — Telehealth: Payer: Self-pay | Admitting: Pulmonary Disease

## 2020-11-12 ENCOUNTER — Encounter: Payer: Self-pay | Admitting: *Deleted

## 2020-11-12 NOTE — Telephone Encounter (Signed)
Yes. Hold esbriet till 2/15

## 2020-11-12 NOTE — Telephone Encounter (Signed)
ATC patient unable to reach and mailbox is full  Looks like previous phone note states to hold until 11/24/20   Dr. Isaiah Serge is this still correct?

## 2020-11-13 ENCOUNTER — Telehealth: Payer: Self-pay | Admitting: Orthopaedic Surgery

## 2020-11-13 NOTE — Telephone Encounter (Signed)
Patient is aware of below recommendations.  She voiced her understanding and had no further questions.  Nothing further needed.  

## 2020-11-13 NOTE — Telephone Encounter (Signed)
Patient submitted medical release form, $25.00 check payment to [employer], short term disability. Accepted 11/13/20

## 2020-11-16 ENCOUNTER — Encounter: Payer: Self-pay | Admitting: Nurse Practitioner

## 2020-11-16 ENCOUNTER — Encounter: Payer: BC Managed Care – PPO | Admitting: Nurse Practitioner

## 2020-11-16 ENCOUNTER — Other Ambulatory Visit: Payer: Self-pay

## 2020-11-17 ENCOUNTER — Encounter: Payer: Self-pay | Admitting: Nurse Practitioner

## 2020-11-17 ENCOUNTER — Telehealth: Payer: Self-pay | Admitting: Nurse Practitioner

## 2020-11-17 ENCOUNTER — Other Ambulatory Visit: Payer: BC Managed Care – PPO

## 2020-11-17 ENCOUNTER — Ambulatory Visit (INDEPENDENT_AMBULATORY_CARE_PROVIDER_SITE_OTHER): Payer: BC Managed Care – PPO | Admitting: Nurse Practitioner

## 2020-11-17 VITALS — BP 120/64 | HR 74 | Temp 96.7°F | Ht 60.0 in | Wt 137.4 lb

## 2020-11-17 DIAGNOSIS — E119 Type 2 diabetes mellitus without complications: Secondary | ICD-10-CM

## 2020-11-17 DIAGNOSIS — Z13220 Encounter for screening for lipoid disorders: Secondary | ICD-10-CM | POA: Diagnosis not present

## 2020-11-17 DIAGNOSIS — F329 Major depressive disorder, single episode, unspecified: Secondary | ICD-10-CM | POA: Diagnosis not present

## 2020-11-17 DIAGNOSIS — I351 Nonrheumatic aortic (valve) insufficiency: Secondary | ICD-10-CM

## 2020-11-17 DIAGNOSIS — Z136 Encounter for screening for cardiovascular disorders: Secondary | ICD-10-CM

## 2020-11-17 DIAGNOSIS — F32A Depression, unspecified: Secondary | ICD-10-CM

## 2020-11-17 DIAGNOSIS — Z0001 Encounter for general adult medical examination with abnormal findings: Secondary | ICD-10-CM | POA: Diagnosis not present

## 2020-11-17 DIAGNOSIS — D649 Anemia, unspecified: Secondary | ICD-10-CM

## 2020-11-17 LAB — BASIC METABOLIC PANEL
BUN: 16 mg/dL (ref 6–23)
CO2: 30 mEq/L (ref 19–32)
Calcium: 9.7 mg/dL (ref 8.4–10.5)
Chloride: 102 mEq/L (ref 96–112)
Creatinine, Ser: 0.88 mg/dL (ref 0.40–1.20)
GFR: 69.34 mL/min (ref >60.00)
Glucose, Bld: 84 mg/dL (ref 70–99)
Potassium: 4.1 mEq/L (ref 3.5–5.1)
Sodium: 137 mEq/L (ref 135–145)

## 2020-11-17 LAB — FERRITIN: Ferritin: 28 ng/mL (ref 10.0–291.0)

## 2020-11-17 LAB — LIPID PANEL
Cholesterol: 172 mg/dL (ref 0–200)
HDL: 62.6 mg/dL (ref >39.00)
LDL Cholesterol: 94 mg/dL (ref 0–99)
NonHDL: 109.87
Total CHOL/HDL Ratio: 3
Triglycerides: 81 mg/dL (ref 0.0–149.0)
VLDL: 16.2 mg/dL (ref 0.0–40.0)

## 2020-11-17 LAB — CBC WITH DIFFERENTIAL/PLATELET
Basophils Absolute: 0 10*3/uL (ref 0.0–0.1)
Basophils Relative: 0.7 % (ref 0.0–3.0)
Eosinophils Absolute: 0.2 10*3/uL (ref 0.0–0.7)
Eosinophils Relative: 4.1 % (ref 0.0–5.0)
HCT: 32.1 % — ABNORMAL LOW (ref 36.0–46.0)
Hemoglobin: 10.5 g/dL — ABNORMAL LOW (ref 12.0–15.0)
Lymphocytes Relative: 29.7 % (ref 12.0–46.0)
Lymphs Abs: 1.8 10*3/uL (ref 0.7–4.0)
MCHC: 32.5 g/dL (ref 30.0–36.0)
MCV: 89.9 fl (ref 78.0–100.0)
Monocytes Absolute: 0.7 10*3/uL (ref 0.1–1.0)
Monocytes Relative: 12.5 % — ABNORMAL HIGH (ref 3.0–12.0)
Neutro Abs: 3.1 10*3/uL (ref 1.4–7.7)
Neutrophils Relative %: 53 % (ref 43.0–77.0)
Platelets: 240 10*3/uL (ref 150.0–400.0)
RBC: 3.57 Mil/uL — ABNORMAL LOW (ref 3.87–5.11)
RDW: 14.2 % (ref 11.5–15.5)
WBC: 5.9 10*3/uL (ref 4.0–10.5)

## 2020-11-17 LAB — B12 AND FOLATE PANEL
Folate: 12.3 ng/mL (ref >5.9)
Vitamin B-12: 288 pg/mL (ref 211–911)

## 2020-11-17 LAB — HEMOGLOBIN A1C: Hgb A1c MFr Bld: 6.6 % — ABNORMAL HIGH (ref 4.6–6.5)

## 2020-11-17 MED ORDER — TRAZODONE HCL 50 MG PO TABS: 25.0000 mg | ORAL_TABLET | Freq: Every day | ORAL | 5 refills | Status: DC

## 2020-11-17 NOTE — Assessment & Plan Note (Addendum)
Chronic, normocytic EGD done 10/2020 by Dr. Russella Dar: esophagitis and stenosis, hiatal hernia, and negative biopsy. Colonoscopy done 2015: normal, no polyp. Repeat in 71yrs.  Repeat cbc, ferritin, B12 and folate: stable cbc, normal ferritin, folate and B12 Repeat cbc annually

## 2020-11-17 NOTE — Assessment & Plan Note (Addendum)
Affects sleep quality the most.  worsening No previous SSRI No improvement with OTC sleep aid  Start trazodone F/up in 1-52months

## 2020-11-17 NOTE — Progress Notes (Signed)
Subjective:    Patient ID: Sandra Curtis, female    DOB: 10/12/1955, 65 y.o.   MRN: [id number]  Patient presents today for CPE and eval of chronic conditions  HPI Aortic regurgitation Repeat echo 07/2020: EF 60-65%, moderate aortic regurgitation without stenosis, no LVH. Has chronic cough and SOB due to pulmonary fibrosis. No LE edema.  Anemia Chronic, normocytic EGD done 10/2020 by Dr. Russella Dar: esophagitis and stenosis, hiatal hernia, and negative biopsy. Colonoscopy done 2015: normal, no polyp. Repeat in 65yrs.  Repeat cbc, ferritin, B12 and folate  Depressive disorder Affects sleep quality the most.  worsening No previous SSRI No improvement with OTC sleep aid  Start trazodone F/up in 1-11months  Type 2 diabetes mellitus without complication, without long-term current use of insulin (HCC) Controlled with diet No neuropathy or nephropathy. BP at goal Advised to schedule annual diabetic eye exam. Repeat lipid panel, HgbA1c and BMP today  Sexual History (orientation,birth control, marital status, STD):needs PAP smear and mammogram. She plans to schedule an appt with GYN: Dr. Chestine Spore  Depression/Suicide: Depression screen [facility] 2/9 11/17/2020 05/26/2020 11/27/2019 01/09/2019 12/08/2017 02/06/2017  Decreased Interest 3 0 0 0 0 0  Down, Depressed, Hopeless 1 0 0 0 0 0  PHQ - 2 Score 4 0 0 0 0 0  Altered sleeping 3 - - - - -  Tired, decreased energy 3 - - - - -  Change in appetite 3 - - - - -  Feeling bad or failure about yourself  0 - - - - -  Trouble concentrating 0 - - - - -  Moving slowly or fidgety/restless 3 - - - - -  Suicidal thoughts 0 - - - - -  PHQ-9 Score 16 - - - - -  Difficult doing work/chores Somewhat difficult - - - - -   GAD 7 : Generalized Anxiety Score 11/17/2020  Nervous, Anxious, on Edge 0  Control/stop worrying 0  Worry too much - different things 0  Trouble relaxing 3  Restless 0  Easily annoyed or irritable 3  Afraid - awful might happen 0   Total GAD 7 Score 6  Anxiety Difficulty Not difficult at all   Vision:will schedule appt with opthalmology   Dental:will schedule appt with dentist  Immunizations: (TDAP, Hep C screen, Pneumovax, Influenza, zoster)  Health Maintenance  Topic Date Due  . Mammogram  06/06/2018  . COVID-19 Vaccine (2 - Booster for [employer] series) 03/14/2020  . Pap Smear  05/19/2020  . Urine Protein Check  11/26/2020  . Flu Shot  01/07/2021*  . Eye exam for diabetics  11/25/2020  . Complete foot exam   11/26/2020  . Hemoglobin A1C  11/26/2020  . Colon Cancer Screening  01/04/2024  . Tetanus Vaccine  12/09/2027  .  Hepatitis C: One time screening is recommended by Center for Disease Control  (CDC) for  adults born from [birth year] through 1965.   Completed  . HIV Screening  Completed  *Topic was postponed. The date shown is not the original due date.   Diet:regular.  Weight:  Wt Readings from Last 3 Encounters:  11/17/20 137 lb 6.4 oz (62.3 kg)  10/27/20 136 lb (61.7 kg)  10/06/20 136 lb (61.7 kg)    Fall Risk: Fall Risk  11/17/2020 11/27/2019 01/09/2019 09/27/2017 02/06/2017  Falls in the past year? 1 0 0 No No  Number falls in past yr: 1 - - - -  Injury with Fall? 0 - - - -  Risk  for fall due to : History of fall(s) - - - -  Follow up Falls evaluation completed - - - -   Advanced Directives 10/27/2020  Does Patient Have a Medical Advance Directive? No  Would patient like information on creating a medical advance directive? No - Patient declined    Medications and allergies reviewed with patient and updated if appropriate.  Patient Active Problem List   Diagnosis Date Noted  . Positive ANA (antinuclear antibody) 09/09/2020  . Bilateral hand pain 09/09/2020  . Pulmonary fibrosis (HCC) 06/26/2020  . Chronic cough 05/13/2020  . Rash 10/30/2019  . Atrophic vaginitis 01/09/2019  . Menopausal flushing 01/09/2019  . Colon polyp, hyperplastic 01/09/2019  . Type 2 diabetes mellitus without  complication, without long-term current use of insulin (HCC) 12/11/2017  . Anemia 12/11/2017  . Depressive disorder 02/06/2017  . Gastroesophageal reflux disease with esophagitis 02/06/2017  . DDD (degenerative disc disease), lumbosacral 02/06/2017  . Dysphagia 02/06/2017  . Scoliosis of lumbar spine 02/06/2017  . Aortic regurgitation 10/02/2013  . Headache 03/01/2012  . Seasonal allergic rhinitis 03/01/2012    Current Outpatient Medications on File Prior to Visit  Medication Sig Dispense Refill  . acetaminophen (TYLENOL 8 HOUR ARTHRITIS PAIN) 650 MG CR tablet Take 1 tablet (650 mg total) by mouth every 8 (eight) hours as needed for pain.    Marland Kitchen albuterol (VENTOLIN HFA) 108 (90 Base) MCG/ACT inhaler Inhale 1-2 puffs into the lungs every 6 (six) hours as needed. 6.7 g 0  . Cholecalciferol (VITAMIN D3) 25 MCG (1000 UT) CAPS Take by mouth.    . gabapentin (NEURONTIN) 100 MG capsule Take 3 capsules (300 mg total) by mouth at bedtime. 90 capsule 5  . pantoprazole (PROTONIX) 40 MG tablet Take 1 tablet (40 mg total) by mouth 2 (two) times daily before a meal. 60 tablet 11  . Spacer/Aero-Holding Chambers (AEROCHAMBER PLUS FLO-VU W/MASK) MISC USE AS DIRECTED ONCE FOR 1 DOSE 1 each 0  . sucralfate (CARAFATE) 1 GM/10ML suspension Take 10 mLs (1 g total) by mouth 4 (four) times daily -  with meals and at bedtime. 420 mL 2   No current facility-administered medications on file prior to visit.   Past Medical History:  Diagnosis Date  . Allergic rhinitis   . Allergy   . Chronic back pain 07/20/2018  . Chronic midline low back pain 02/06/2017  . Depression   . Esophageal stricture   . GERD (gastroesophageal reflux disease)   . Inflammatory polyps of colon (HCC) 2010  . Pulmonary filariasis   . Systolic murmur   . UTI (urinary tract infection)     Past Surgical History:  Procedure Laterality Date  . COLONOSCOPY    . UPPER GASTROINTESTINAL ENDOSCOPY      Social History   Socioeconomic  History  . Marital status: Single    Spouse name: Not on file  . Number of children: 1  . Years of education: Not on file  . Highest education level: Not on file  Occupational History  . Occupation: Biomedical engineer: paperworks  Tobacco Use  . Smoking status: Former Smoker    Packs/day: 0.50    Years: 35.00    Pack years: 17.50    Types: Cigarettes    Quit date: 04/12/2018    Years since quitting: 2.6  . Smokeless tobacco: Never Used  Vaping Use  . Vaping Use: Never used  Substance and Sexual Activity  . Alcohol use: Not Currently  . Drug use:  No  . Sexual activity: Not Currently  Other Topics Concern  . Not on file  Social History Narrative  . Not on file   Social Determinants of Health   Financial Resource Strain: Not on file  Food Insecurity: Not on file  Transportation Needs: Not on file  Physical Activity: Not on file  Stress: Not on file  Social Connections: Not on file    Family History  Problem Relation Age of Onset  . Arthritis Mother   . Thyroid disease Mother   . Heart disease Father   . Hypertension Father   . Diabetes Father   . Arthritis Sister   . Diabetes Brother   . Colon cancer Neg Hx   . Stomach cancer Neg Hx   . Rectal cancer Neg Hx   . Esophageal cancer Neg Hx         ROS  Objective:   Vitals:   11/17/20 0947  BP: 120/64  Pulse: 74  Temp: (!) 96.7 F (35.9 C)  SpO2: 99%    Body mass index is 26.83 kg/m.   Physical Examination:  Physical Exam Vitals reviewed.  Constitutional:      Appearance: She is obese.  HENT:     Right Ear: Tympanic membrane, ear canal and external ear normal.     Left Ear: Tympanic membrane, ear canal and external ear normal.  Cardiovascular:     Rate and Rhythm: Normal rate and regular rhythm.     Pulses: Normal pulses.     Heart sounds: Normal heart sounds.  Pulmonary:     Effort: Pulmonary effort is normal.     Breath sounds: Normal breath sounds.  Abdominal:     General: Bowel  sounds are normal.     Palpations: Abdomen is soft.  Genitourinary:    Comments: Declined pelvic and breast exam Musculoskeletal:     Cervical back: Normal range of motion and neck supple.     Right lower leg: No edema.     Left lower leg: No edema.  Lymphadenopathy:     Cervical: No cervical adenopathy.  Skin:    General: Skin is warm and dry.  Neurological:     Mental Status: She is alert and oriented to person, place, and time.     Gait: Gait normal.    ASSESSMENT and PLAN: This visit occurred during the SARS-CoV-2 public health emergency.  Safety protocols were in place, including screening questions prior to the visit, additional usage of staff PPE, and extensive cleaning of exam room while observing appropriate contact time as indicated for disinfecting solutions.   Falen was seen today for annual exam.  Diagnoses and all orders for this visit:  Encounter for preventative adult health care exam with abnormal findings -     Basic metabolic panel  Type 2 diabetes mellitus without complication, without long-term current use of insulin (HCC) -     Hemoglobin A1c  Encounter for lipid screening for cardiovascular disease  Anemia, unspecified type -     CBC w/Diff -     Ferritin -     B12 and Folate Panel  Aortic valve insufficiency, etiology of cardiac valve disease unspecified  Depressive disorder -     traZODone (DESYREL) 50 MG tablet; Take 0.5-1 tablets (25-50 mg total) by mouth at bedtime.  Maintain appts with ortho, pulmonology and GI Schedule appt with GYN for pelvic and breast exam. Schedule appt for COVID vaccine booster dose.    Problem List Items Addressed This  Visit      Cardiovascular and Mediastinum   Aortic regurgitation    Repeat echo 07/2020: EF 60-65%, moderate aortic regurgitation without stenosis, no LVH. Has chronic cough and SOB due to pulmonary fibrosis. No LE edema.        Endocrine   Type 2 diabetes mellitus without complication,  without long-term current use of insulin (HCC)    Controlled with diet No neuropathy or nephropathy. BP at goal Advised to schedule annual diabetic eye exam. Repeat lipid panel, HgbA1c and BMP today      Relevant Orders   Hemoglobin A1c     Other   Anemia    Chronic, normocytic EGD done 10/2020 by Dr. Russella Dar: esophagitis and stenosis, hiatal hernia, and negative biopsy. Colonoscopy done 2015: normal, no polyp. Repeat in 21yrs.  Repeat cbc, ferritin, B12 and folate      Relevant Orders   CBC w/Diff   Ferritin   B12 and Folate Panel   Depressive disorder    Affects sleep quality the most.  worsening No previous SSRI No improvement with OTC sleep aid  Start trazodone F/up in 1-66months      Relevant Medications   traZODone (DESYREL) 50 MG tablet    Other Visit Diagnoses    Encounter for preventative adult health care exam with abnormal findings    -  Primary   Relevant Orders   Basic metabolic panel   Encounter for lipid screening for cardiovascular disease          Follow up: Return in about 4 weeks (around 12/15/2020) for depression and insomnia (video appt).  Alysia Penna, NP

## 2020-11-17 NOTE — Addendum Note (Signed)
Addended by: Michaela Corner on: 11/17/2020 03:26 PM   Modules accepted: Orders

## 2020-11-17 NOTE — Assessment & Plan Note (Signed)
Repeat echo 07/2020: EF 60-65%, moderate aortic regurgitation without stenosis, no LVH. Has chronic cough and SOB due to pulmonary fibrosis. No LE edema.

## 2020-11-17 NOTE — Telephone Encounter (Signed)
Patient notified VIA phone and scheduled for VV on 4/5 @ 9:00 am.  Patient agreeable to appt. Dm/cma

## 2020-11-17 NOTE — Patient Instructions (Signed)
Go to lab for blood draw Maintain appts with ortho, pulmonology and GI  Schedule appt with GYN for pelvic and breast exam.  Schedule appt for COVID vaccine booster dose.  Preventive Care 65-65 Years Old, Female Preventive care refers to lifestyle choices and visits with your health care provider that can promote health and wellness. This includes:  A yearly physical exam. This is also called an annual wellness visit.  Regular dental and eye exams.  Immunizations.  Screening for certain conditions.  Healthy lifestyle choices, such as: ? Eating a healthy diet. ? Getting regular exercise. ? Not using drugs or products that contain nicotine and tobacco. ? Limiting alcohol use. What can I expect for my preventive care visit? Physical exam Your health care provider will check your:  Height and weight. These may be used to calculate your BMI (body mass index). BMI is a measurement that tells if you are at a healthy weight.  Heart rate and blood pressure.  Body temperature.  Skin for abnormal spots. Counseling Your health care provider may ask you questions about your:  Past medical problems.  Family's medical history.  Alcohol, tobacco, and drug use.  Emotional well-being.  Home life and relationship well-being.  Sexual activity.  Diet, exercise, and sleep habits.  Work and work Statistician.  Access to firearms.  Method of birth control.  Menstrual cycle.  Pregnancy history. What immunizations do I need? Vaccines are usually given at various ages, according to a schedule. Your health care provider will recommend vaccines for you based on your age, medical history, and lifestyle or other factors, such as travel or where you work.   What tests do I need? Blood tests  Lipid and cholesterol levels. These may be checked every 5 years, or more often if you are over 65 years old.  Hepatitis C test.  Hepatitis B test. Screening  Lung cancer screening. You may  have this screening every year starting at age 65 if you have a 30-pack-year history of smoking and currently smoke or have quit within the past 15 years.  Colorectal cancer screening. ? All adults should have this screening starting at age 65 and continuing until age 75. ? Your health care provider may recommend screening at age 75 if you are at increased risk. ? You will have tests every 1-10 years, depending on your results and the type of screening test.  Diabetes screening. ? This is done by checking your blood sugar (glucose) after you have not eaten for a while (fasting). ? You may have this done every 1-3 years.  Mammogram. ? This may be done every 1-2 years. ? Talk with your health care provider about when you should start having regular mammograms. This may depend on whether you have a family history of breast cancer.  BRCA-related cancer screening. This may be done if you have a family history of breast, ovarian, tubal, or peritoneal cancers.  Pelvic exam and Pap test. ? This may be done every 3 years starting at age 65. ? Starting at age 65, this may be done every 5 years if you have a Pap test in combination with an HPV test. Other tests  STD (sexually transmitted disease) testing, if you are at risk.  Bone density scan. This is done to screen for osteoporosis. You may have this scan if you are at high risk for osteoporosis. Talk with your health care provider about your test results, treatment options, and if necessary, the need for more tests.  Follow these instructions at home: Eating and drinking  Eat a diet that includes fresh fruits and vegetables, whole grains, lean protein, and low-fat dairy products.  Take vitamin and mineral supplements as recommended by your health care provider.  Do not drink alcohol if: ? Your health care provider tells you not to drink. ? You are pregnant, may be pregnant, or are planning to become pregnant.  If you drink  alcohol: ? Limit how much you have to 0-1 drink a day. ? Be aware of how much alcohol is in your drink. In the [place] one drink equals one 12 oz bottle of beer (355 mL), one 5 oz glass of wine (148 mL), or one 1 oz glass of hard liquor (44 mL).   Lifestyle  Take daily care of your teeth and gums. Brush your teeth every morning and night with fluoride toothpaste. Floss one time each day.  Stay active. Exercise for at least 30 minutes 5 or more days each week.  Do not use any products that contain nicotine or tobacco, such as cigarettes, e-cigarettes, and chewing tobacco. If you need help quitting, ask your health care provider.  Do not use drugs.  If you are sexually active, practice safe sex. Use a condom or other form of protection to prevent STIs (sexually transmitted infections).  If you do not wish to become pregnant, use a form of birth control. If you plan to become pregnant, see your health care provider for a prepregnancy visit.  If told by your health care provider, take low-dose aspirin daily starting at age 65.  Find healthy ways to cope with stress, such as: ? Meditation, yoga, or listening to music. ? Journaling. ? Talking to a trusted person. ? Spending time with friends and family. Safety  Always wear your seat belt while driving or riding in a vehicle.  Do not drive: ? If you have been drinking alcohol. Do not ride with someone who has been drinking. ? When you are tired or distracted. ? While texting.  Wear a helmet and other protective equipment during sports activities.  If you have firearms in your house, make sure you follow all gun safety procedures. What's next?  Visit your health care provider once a year for an annual wellness visit.  Ask your health care provider how often you should have your eyes and teeth checked.  Stay up to date on all vaccines. This information is not intended to replace advice given to you by your health care provider. Make  sure you discuss any questions you have with your health care provider. Document Revised: 06/30/2020 Document Reviewed: 06/07/2018 Elsevier Patient Education  2021 [employer].

## 2020-11-17 NOTE — Assessment & Plan Note (Addendum)
Controlled with diet No neuropathy or nephropathy. BP at goal Advised to schedule annual diabetic eye exam. Repeat lipid panel, HgbA1c and BMP today Stable HgbA1c at 6.5 Repeat in 76months Normal renal function

## 2020-11-18 ENCOUNTER — Telehealth: Payer: Self-pay | Admitting: Pulmonary Disease

## 2020-11-18 NOTE — Telephone Encounter (Signed)
Patient dropped off disability paperwork - pt last seen in 12/202, no mention of being out of work, and recommendation was to follow up in 1-2 months - pt is scheduled to see Dr. Isaiah Serge on 11/24/2020. Will hold paperwork until after that visit.   Dr. Isaiah Serge if you can please address Ms. Sandra Curtis being able to return to work - also she states she can't work on prescription meds due to dizziness, headache, and fatigue. -pr

## 2020-11-23 ENCOUNTER — Telehealth: Payer: Self-pay | Admitting: Pulmonary Disease

## 2020-11-23 NOTE — Telephone Encounter (Signed)
Per patient's chart, we have received the paperwork. Will close encounter.

## 2020-11-23 NOTE — Telephone Encounter (Signed)
Looks like this is the disability paperwork that was referenced in previous phone note.

## 2020-11-24 ENCOUNTER — Ambulatory Visit (INDEPENDENT_AMBULATORY_CARE_PROVIDER_SITE_OTHER): Payer: BC Managed Care – PPO | Admitting: Pulmonary Disease

## 2020-11-24 ENCOUNTER — Encounter: Payer: Self-pay | Admitting: Pulmonary Disease

## 2020-11-24 ENCOUNTER — Other Ambulatory Visit: Payer: Self-pay

## 2020-11-24 VITALS — BP 122/68 | HR 97 | Temp 97.3°F | Ht 60.0 in | Wt 136.6 lb

## 2020-11-24 DIAGNOSIS — J8410 Pulmonary fibrosis, unspecified: Secondary | ICD-10-CM | POA: Diagnosis not present

## 2020-11-24 DIAGNOSIS — Z5181 Encounter for therapeutic drug level monitoring: Secondary | ICD-10-CM | POA: Diagnosis not present

## 2020-11-24 NOTE — Patient Instructions (Signed)
I am sorry that you are not able to tolerate the Esbriet We will try you on an alternate medication called Ofev 100 mg twice daily.  Follow-up in 2 months.

## 2020-11-24 NOTE — Progress Notes (Signed)
Sandra Curtis    [id number]    1956-06-29  Primary Care Physician:Nche, Bonna Gains, NP  Referring Physician: [name], NP [address] [place],  [place] [address]  Chief complaint: Follow-up for pulmonary fibrosis   HPI: 65 year old with history of allergies, GERD, esophageal stricture Complains of chronic cough for several years, worsening dyspnea on exertion. She had a CT scan done by Dr. Elease Etienne, primary care showing pulmonary fibrosis and has been referred for further evaluation.  Previously followed by Dr. Russella Dar, GI with EGD in 2004 showing esophageal stricture secondary to acid reflux.  She has not followed up since then Started on Nexium in August 2021 by primary care.  Has followed up with Dr. Russella Dar, GI who has increased her Protonix to 40 mg twice daily and scheduled EGD Has also seen Dr. Dimple Casey, rheumatology for evaluation of forced of ANA and SSA.  He feels she does not have any signs of autoimmune disease.  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  No mold, hot tub, Jacuzzi.  No feather pillows or comforters Smoking history: 18-pack-year smoker.  Quit 1 month ago Travel history: No significant travel history Relevant family history: No significant family issue of lung disease  Interval history: Continues to have chronic dyspnea on exertion. Did not tolerate Esbriet due to dizziness, headache.  She has stopped. LFTs continue to be normal.  Outpatient Encounter Medications as of 11/24/2020  Medication Sig  . acetaminophen (TYLENOL 8 HOUR ARTHRITIS PAIN) 650 MG CR tablet Take 1 tablet (650 mg total) by mouth every 8 (eight) hours as needed for pain.  Marland Kitchen albuterol (VENTOLIN HFA) 108 (90 Base) MCG/ACT inhaler Inhale 1-2 puffs into the lungs every 6 (six) hours as needed.  . Cholecalciferol (VITAMIN D3) 25 MCG (1000  UT) CAPS Take by mouth.  . gabapentin (NEURONTIN) 100 MG capsule Take 3 capsules (300 mg total) by mouth at bedtime.  . pantoprazole (PROTONIX) 40 MG tablet Take 1 tablet (40 mg total) by mouth 2 (two) times daily before a meal.  . Spacer/Aero-Holding Chambers (AEROCHAMBER PLUS FLO-VU W/MASK) MISC USE AS DIRECTED ONCE FOR 1 DOSE  . sucralfate (CARAFATE) 1 GM/10ML suspension Take 10 mLs (1 g total) by mouth 4 (four) times daily -  with meals and at bedtime.  . traZODone (DESYREL) 50 MG tablet Take 0.5-1 tablets (25-50 mg total) by mouth at bedtime.   No facility-administered encounter medications on file as of 11/24/2020.    Physical Exam: Blood pressure 122/68, pulse 97, temperature (!) 97.3 F (36.3 C), temperature source Temporal, height 5' (1.524 m), weight 136 lb 9.6 oz (62 kg), SpO2 97 %. Gen:      No acute distress HEENT:  EOMI, sclera anicteric Neck:     No masses; no thyromegaly Lungs:    Bibasal crackles CV:         Regular rate and rhythm; no murmurs Abd:      + bowel sounds; soft, non-tender; no palpable masses, no distension Ext:    No edema; adequate peripheral perfusion Skin:      Warm and dry; no rash Neuro: alert and oriented x 3 Psych: normal mood and affect  Data Reviewed: Imaging: Chest x-ray 05/15/2020-diffuse interstitial opacities CT chest 06/17/2020-reticulation with traction bronchiectasis, honeycombing most prominent in the right lung base.  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 She does have mild arthritis symptoms which may be secondary to osteoarthritis but does endorse some morning stiffness and small joint pain.  She had some occupational exposures to  textile fiber and dust but no asbestos No evidence of autoimmune process, connective tissue disease per rheumatology evaluation.  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  GERD History notable for significant GERD symptoms, reflux and esophageal stricture.  Follows with Dr. Russella Dar.    Plan/Recommendations: Stop Esbriet, start Dondra Spry MD  Pulmonary and Critical Care 11/24/2020, 9:05 AM  CC: Nche, Bonna Gains, NP

## 2020-11-26 ENCOUNTER — Encounter: Payer: Self-pay | Admitting: Nurse Practitioner

## 2020-11-26 DIAGNOSIS — Z0289 Encounter for other administrative examinations: Secondary | ICD-10-CM

## 2020-11-26 NOTE — Telephone Encounter (Signed)
Patient seen on 11/24/2020 - will give paperwork to Dr. Isaiah Serge to review and sign. -pr

## 2020-11-26 NOTE — Telephone Encounter (Signed)
Received paperwork back from Dr. Isaiah Serge. Waiting on Marisue Ivan to drop form fee. -pr

## 2020-11-27 ENCOUNTER — Telehealth: Payer: Self-pay | Admitting: Pulmonary Disease

## 2020-11-27 ENCOUNTER — Telehealth: Payer: Self-pay | Admitting: Orthopaedic Surgery

## 2020-11-27 NOTE — Telephone Encounter (Signed)
I called patient. The paperwork sent over from [employer] is STD paperwork and Dr. Ophelia Charter has not taken her out of work. Patient states that she "needs a break". I explained the difference between what she has had filled out previously which is FMLA paperwork and the STD paperwork. I also explained that Dr. Ophelia Charter has not taken her out of work and that she will need an appointment to discuss all of this with him prior to paperwork being able to be completed. She expressed understanding. Appt made for next week.  Paperwork in my inbox bin to be addressed at appointment.

## 2020-11-27 NOTE — Telephone Encounter (Signed)
Pt called and states that she called [employer] and they said they're waiting for Dr.Yates to sign the forms and she would like to be contacted when it has been done. CB [phone]

## 2020-11-27 NOTE — Telephone Encounter (Signed)
Forms are complete and charge dropped. Called and left message on pt vm that she can come by the office to pay fee. There are 2 additional forms that she will need to sign that was not signed at form drop off. I have the forms in my office. -pr

## 2020-11-28 NOTE — Telephone Encounter (Signed)
Per previous note for this patient - patient was contacted to pay form fee of $29 - at which time I can fax forms. -pr

## 2020-11-30 NOTE — Telephone Encounter (Signed)
LMTCB

## 2020-12-01 ENCOUNTER — Ambulatory Visit: Payer: BC Managed Care – PPO | Admitting: Orthopaedic Surgery

## 2020-12-01 NOTE — Telephone Encounter (Signed)
Patient came and paid fee and picked up forms - pr

## 2020-12-02 ENCOUNTER — Telehealth: Payer: Self-pay | Admitting: Pharmacy Technician

## 2020-12-02 DIAGNOSIS — J84112 Idiopathic pulmonary fibrosis: Secondary | ICD-10-CM

## 2020-12-02 DIAGNOSIS — J8410 Pulmonary fibrosis, unspecified: Secondary | ICD-10-CM

## 2020-12-02 NOTE — Telephone Encounter (Signed)
Received New start paperwork for OFEV 100mg . Will update as we work through the benefits process.  Patient could not tolerate Esbriet.

## 2020-12-02 NOTE — Telephone Encounter (Signed)
Submitted a Prior Authorization request to [employer] for OFEV 100mg  via [employer] Portal. Will update once we receive a response.  [email or web address]  EOC- 

## 2020-12-04 NOTE — Telephone Encounter (Signed)
Received a fax regarding Prior Authorization from Rx Benefits for OFEV. Authorization has been DENIED because the requested medication is approved for diagnosis of IPF if there has been exclusion of other known causes of ILD.  Phone#[phone]

## 2020-12-09 ENCOUNTER — Other Ambulatory Visit: Payer: Self-pay | Admitting: Pulmonary Disease

## 2020-12-09 NOTE — Telephone Encounter (Signed)
LMTCB

## 2020-12-10 MED ORDER — PREDNISONE 10 MG PO TABS: ORAL_TABLET | ORAL | 0 refills | Status: AC

## 2020-12-10 MED ORDER — PREDNISONE 10 MG PO TABS: 10.0000 mg | ORAL_TABLET | Freq: Every day | ORAL | 0 refills | Status: AC

## 2020-12-10 MED ORDER — HYDROCODONE-HOMATROPINE 5-1.5 MG/5ML PO SYRP: 5.0000 mL | ORAL_SOLUTION | Freq: Four times a day (QID) | ORAL | 0 refills | Status: DC | PRN

## 2020-12-10 NOTE — Telephone Encounter (Signed)
Pt also says not being able yo keep food down due to the cough.

## 2020-12-10 NOTE — Telephone Encounter (Signed)
Call returned to patient, confirmed DOB. Patient reports her cough is getting worse. She said the esbriet was D/C due to the side effects. She reports her food is coming back up. She reports mostly it is a dry cough that will not stop. She states she has been out of work for 2 days due to the constant coughing. She also reports she is still waiting on her ofev. I made her aware they are still working on the [facility]. Denies fever. She reports she has a throbbing headache from coughing so much. Denies chest pain or acute distress. She has not tried anything over the counter to address cough. She is requesting something for relief from cough.   JD please advise. Thanks :)

## 2020-12-10 NOTE — Telephone Encounter (Signed)
Please send in prescription for for steroid taper with prednisone 10mg  tablets: 4 tablets daily for 3 days 3 tablets daily for 3 days 2 tablets daily for 3 days Then 1 tablet daily until she is able to start her OFEV IF she notices relief from this taper.    Please send in prescription for hycodan cough syrup and I can sign it this afternoon for her to pick up. Or let me know if it's better to send electronically.  Thanks, 

## 2020-12-10 NOTE — Telephone Encounter (Signed)
Call returned to patient, confirmed DOB. Made aware of JD recommendations. Voiced understanding. Re-iterated she is not to start the prednisone 10 mg daily until she completes the taper and this is only until she starts her OFEV if she see's an improvement with the prednisone. Voiced understanding. She requested that hycodan be sent electronically.   Will route message to MD to send in electronically.   JD medication has been pended for you. Thanks.

## 2020-12-11 ENCOUNTER — Telehealth: Payer: Self-pay | Admitting: Pulmonary Disease

## 2020-12-11 ENCOUNTER — Telehealth: Payer: Self-pay | Admitting: Pharmacy Technician

## 2020-12-11 NOTE — Telephone Encounter (Signed)
Patient called requesting a doctor's note for work. States she missed 3 days this week due to breathing complications.  Requests a call back.

## 2020-12-11 NOTE — Telephone Encounter (Signed)
Spoke to patient and advised office is working on her [employer]. We will advise her once we hear back from her insurance.

## 2020-12-11 NOTE — Telephone Encounter (Signed)
disregard

## 2020-12-11 NOTE — Telephone Encounter (Signed)
Called and spoke with pt and she stated that she has missed 3 days of work this week and she is needing a doctors note for this.  She has been having a dry cough and headache and she did pick up the meds for her cough and this is helping her some.  She stated that she is now vomiting and she is not able to keep anything down. She stated that she cannot work while taking those meds as she works around heavy machines.  She also stated that she cannot work around the dust and silicone as this makes her cough worse.   PM please advise. Thanks

## 2020-12-11 NOTE — Telephone Encounter (Signed)
Okay to give a doctor's note for work as requested by the patient  Can you please clarify if she is taking either Esbriet or Ofev.

## 2020-12-11 NOTE — Telephone Encounter (Signed)
She stated that she was stopped on the esbriet and she has not started on the ofev.  Pharmacy is working on this.

## 2020-12-14 ENCOUNTER — Telehealth: Payer: Self-pay | Admitting: Pulmonary Disease

## 2020-12-14 NOTE — Telephone Encounter (Signed)
Returned patient's call and advised we have submitted urgent appeal to insurance for her Ofev. Will f/u with insurance on Friday for update. Patient advised we will provide her updates as we get them from insurance  Chesley Mires, PharmD, MPH Clinical Pharmacist (Rheumatology and Pulmonology)

## 2020-12-14 NOTE — Telephone Encounter (Signed)
Please see previous encounter in regards to this. Will close this encounter.

## 2020-12-14 NOTE — Telephone Encounter (Signed)
Pt wanting note because she has been out of work Wednesday and would like a note. I explained our doctors will not write a note for pt to be out. Pt states she cannot work on the hycodan cough syrup that JD prescribed. Pt states she has insomnia and depression and is only sleeping a few hours a night. Pt is VERY tearful and wanting to know what to do. Pt is also waiting on Ofev to be approved. Message has been resent to Pharmacy about this. Please advise. [phone]

## 2020-12-14 NOTE — Telephone Encounter (Signed)
Faxed URGENT appeal to [employer] for Ofev  Fax: [phone] Phone: [phone]  Chesley Mires, PharmD, MPH Clinical Pharmacist (Rheumatology and Pulmonology)

## 2020-12-14 NOTE — Telephone Encounter (Signed)
Letter has been typed up and signed by Dr. Laurann Montana and spoke with patient to let her know that it was ready and asked if she wanted to pick it up or if she wanted it mailed. She stated she is going to go back to work today and that she will pick it up. Advised her that we would include in the letter  She also stated that she cannot work around the dust and silicone as this makes her cough worse, but that it would be up to her job on how to handle it. She expressed understanding. Advised patient to only take cough syrup as needed and not at work but that we could not take her completely out of work due to her being on the medication as needed. She expressed understanding. Nothing further needed at this time.

## 2020-12-21 ENCOUNTER — Telehealth: Payer: Self-pay | Admitting: Pulmonary Disease

## 2020-12-21 NOTE — Telephone Encounter (Signed)
LaShonda from RX benefits calling to inform of what is needed for the appeal. [place] can be reached at [phone].

## 2020-12-21 NOTE — Telephone Encounter (Signed)
Re-faxed [employer] with appeal documentation including dx of IPF. Included notes from multi-disciplinary round and supporting primary literature.  Fax: [phone]  Chesley Mires, PharmD, MPH Clinical Pharmacist (Rheumatology and Pulmonology)

## 2020-12-21 NOTE — Telephone Encounter (Signed)
LMTCB with patient.   Call made to pharmacy, hycodan was last filled 12/10/20 but was only a 12 day supply.   Will route message to provider to see if he is willing to refill medication.   PM please advise if willing to refill. Medication has been pended. Thanks

## 2020-12-21 NOTE — Telephone Encounter (Signed)
Faxed [employer] with appeal documentation including dx of IPF. Included notes from multi-disciplinary rounds.  Fax: [phone]  Chesley Mires, PharmD, MPH Clinical Pharmacist (Rheumatology and Pulmonology)

## 2020-12-22 ENCOUNTER — Encounter: Payer: Self-pay | Admitting: Pulmonary Disease

## 2020-12-22 ENCOUNTER — Other Ambulatory Visit: Payer: Self-pay

## 2020-12-22 ENCOUNTER — Ambulatory Visit (INDEPENDENT_AMBULATORY_CARE_PROVIDER_SITE_OTHER): Payer: BC Managed Care – PPO | Admitting: Pulmonary Disease

## 2020-12-22 ENCOUNTER — Encounter: Payer: Self-pay | Admitting: Nurse Practitioner

## 2020-12-22 VITALS — BP 126/76 | HR 83 | Temp 98.3°F | Ht 60.0 in | Wt 136.2 lb

## 2020-12-22 DIAGNOSIS — J84112 Idiopathic pulmonary fibrosis: Secondary | ICD-10-CM | POA: Diagnosis not present

## 2020-12-22 DIAGNOSIS — J8410 Pulmonary fibrosis, unspecified: Secondary | ICD-10-CM | POA: Diagnosis not present

## 2020-12-22 NOTE — Patient Instructions (Addendum)
Pharmacy still working with insurance to get the new medication Ofev approved We will refer you to pulmonary rehab  Please contact the pulmonary fibrosis support group at [email or web address]  Follow-up in 3 months.

## 2020-12-22 NOTE — Progress Notes (Addendum)
Sandra Curtis    [id number]    25-May-1956  Primary Care Physician:Nche, Bonna Gains, NP  Referring Physician: [name], NP [address] [place],  [place] [address]  Chief complaint: Follow-up for IPF   HPI: 65 year old with history of allergies, GERD, esophageal stricture Complains of chronic cough for several years, worsening dyspnea on exertion. She had a CT scan done by Dr. Elease Etienne, primary care showing pulmonary fibrosis and has been referred for further evaluation.  Previously followed by Dr. Russella Dar, GI with EGD in 2004 showing esophageal stricture secondary to acid reflux.  She has not followed up since then Started on Nexium in August 2021 by primary care.  Has followed up with Dr. Russella Dar, GI who has increased her Protonix to 40 mg twice daily and scheduled EGD Has also seen Dr. Dimple Casey, rheumatology for evaluation of forced of ANA and SSA.  He feels she does not have any signs of autoimmune disease.  Started Esbriet in December 2021.  But did not tolerate even at a low dose due to dizziness and headache.  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  No mold, hot tub, Jacuzzi.  No feather pillows or comforters Smoking history: 18-pack-year smoker.  Quit 1 month ago Travel history: No significant travel history Relevant family history: No significant family issue of lung disease  Interval history: She has not tolerated Transport planner for [employer] placed however insurance is requiring clarification if this is ILD or IPF We have faxed the results of the multidisciplinary conference which clearly give her a diagnosis of IPF.  Continues to have dyspnea on exertion She has been struggling with mood changes and depression with her new diagnosis Currently on temporary disability.  Outpatient Encounter Medications as of 12/22/2020   Medication Sig  . acetaminophen (TYLENOL 8 HOUR) 650 MG CR tablet Take 650 mg by mouth every 8 (eight) hours as needed for pain.  Marland Kitchen albuterol (VENTOLIN HFA) 108 (90 Base) MCG/ACT inhaler Inhale 1-2 puffs into the lungs every 6 (six) hours as needed.  . Cholecalciferol (VITAMIN D3) 25 MCG (1000 UT) CAPS Take by mouth.  . gabapentin (NEURONTIN) 100 MG capsule Take 3 capsules (300 mg total) by mouth at bedtime.  . pantoprazole (PROTONIX) 40 MG tablet Take 1 tablet (40 mg total) by mouth 2 (two) times daily before a meal.  . predniSONE (DELTASONE) 10 MG tablet Take 1 tablet (10 mg total) by mouth daily with breakfast.  . Spacer/Aero-Holding Chambers (AEROCHAMBER PLUS FLO-VU W/MASK) MISC USE AS DIRECTED ONCE FOR 1 DOSE  . sucralfate (CARAFATE) 1 GM/10ML suspension Take 10 mLs (1 g total) by mouth 4 (four) times daily -  with meals and at bedtime.  . traZODone (DESYREL) 50 MG tablet Take 0.5-1 tablets (25-50 mg total) by mouth at bedtime.  Marland Kitchen HYDROcodone-homatropine (HYCODAN) 5-1.5 MG/5ML syrup Take 5 mLs by mouth every 6 (six) hours as needed for cough. (Patient not taking: Reported on 12/22/2020)  . [DISCONTINUED] acetaminophen (TYLENOL 8 HOUR ARTHRITIS PAIN) 650 MG CR tablet Take 1 tablet (650 mg total) by mouth every 8 (eight) hours as needed for pain.   No facility-administered encounter medications on file as of 12/22/2020.    Physical Exam: Blood pressure 122/68, pulse 97, temperature (!) 97.3 F (36.3 C), temperature source Temporal, height 5' (1.524 m), weight 136 lb 9.6 oz (62 kg),  SpO2 97 %. Gen:      No acute distress HEENT:  EOMI, sclera anicteric Neck:     No masses; no thyromegaly Lungs:    Bibasal crackles CV:         Regular rate and rhythm; no murmurs Abd:      + bowel sounds; soft, non-tender; no palpable masses, no distension Ext:    No edema; adequate peripheral perfusion Skin:      Warm and dry; no rash Neuro: alert and oriented x 3 Psych: normal mood and affect  Data  Reviewed: Imaging: Chest x-ray 05/15/2020-diffuse interstitial opacities CT chest 06/17/2020-reticulation with traction bronchiectasis, honeycombing most prominent in the right lung base.  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  PFTs: 09/09/2020 FVC 1.80 [85%], FEV1 1.55 [94%], F/F 86, TLC 3.05 [60%], DLCO 10.61 [61%] Mild-moderate restriction and diffusion defect  Labs: ANA 06/27/2019 1:40, cytoplasmic, rheumatoid factor less than 14 ANA 07/16/2020-1:40, SSA greater than 8  Assessment:  IPF CT with UIP pattern fibrosis. She does have mild arthritis symptoms which may be secondary to osteoarthritis but does endorse some morning stiffness and small joint pain.  She had some occupational exposures to textile fiber and dust but no asbestos. Her exposures are not a cause of the pulmonary finrosis No evidence of autoimmune process, connective tissue disease per rheumatology evaluation.  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  Pharmacy is working with insurance to get this approved.  I have given her the contact information for the pulmonary fibrosis support group, referred to pulmonary rehab She may need referral for transplant eventually.  GERD History notable for significant GERD symptoms, reflux and esophageal stricture.  Follows with Dr. Russella Dar.    Plan/Recommendations: Animal nutritionist for [employer] Pulmonary rehab  Chilton Greathouse MD [facility] 12/22/2020, 10:12 AM  CC: Nche, Bonna Gains, NP

## 2020-12-23 ENCOUNTER — Telehealth: Payer: Self-pay | Admitting: Pulmonary Disease

## 2020-12-23 ENCOUNTER — Other Ambulatory Visit: Payer: Self-pay

## 2020-12-23 MED ORDER — HYDROCODONE-HOMATROPINE 5-1.5 MG/5ML PO SYRP: 5.0000 mL | ORAL_SOLUTION | Freq: Four times a day (QID) | ORAL | 0 refills | Status: DC | PRN

## 2020-12-23 NOTE — Telephone Encounter (Signed)
Ok. Renewed.

## 2020-12-23 NOTE — Telephone Encounter (Signed)
Called and left detailed msg letting pt know her rx was sent (ok per DPR)

## 2020-12-24 ENCOUNTER — Encounter (HOSPITAL_COMMUNITY): Payer: Self-pay | Admitting: *Deleted

## 2020-12-24 NOTE — Progress Notes (Signed)
Received referral from Dr. Isaiah Serge for this pt to participate in pulmonary rehab with the the diagnosis of Pulmonary Fibrosis. Clinical review of pt follow up appt on 3/15 Pulmonary office note. Also reviewed follow up appt with her PCP and orthopedic provider Pt with Covid Risk Score - 3. Pt appropriate for scheduling for Pulmonary rehab.  Will forward to support staff for scheduling and verification of insurance eligibility/benefits with pt consent. Alanson Aly, BSN Cardiac and Emergency planning/management officer

## 2020-12-25 NOTE — Telephone Encounter (Signed)
Rec'd completed paperwork back from Dr. Isaiah Serge - faxed to [employer] at [phone]. I have called patient and advised her form was sent -pr

## 2020-12-29 NOTE — Telephone Encounter (Signed)
Called [employer] to check on appeal, rep Kennyth Lose advised that reviewer is requesting additional documentation for diagnosis. She will refax request to office.  Insurance has patient's name as Sandra Curtis Patient ID# [id number] Phone# [phone]

## 2020-12-30 LAB — HM MAMMOGRAPHY

## 2021-01-01 NOTE — Telephone Encounter (Signed)
Re-faxed appeal to [employer]. Patient's diagnosis is IPF and dx was further discussed and determined at mulit-disciplinary case meeting. Will f/u  Fax: [phone] Phone: [phone]

## 2021-01-04 ENCOUNTER — Other Ambulatory Visit: Payer: Self-pay | Admitting: Obstetrics

## 2021-01-04 DIAGNOSIS — R928 Other abnormal and inconclusive findings on diagnostic imaging of breast: Secondary | ICD-10-CM

## 2021-01-04 NOTE — Telephone Encounter (Signed)
Pt returning a phone call about her ofev patient assistance paperwork. Pt can be reached at [phone]

## 2021-01-06 NOTE — Telephone Encounter (Signed)
Per [employer], appeal for Ofev is in final review. Expected response is 01/11/21.  Spoke with patient advising that [facility] will not be able to fill the medication until authorization is approved. We discussed in detail that prior auth would have to be approved her insurance first before the pharmacy can fill it, otherwise she'd have to pay out-of-pocket for it. I believe there is some confusion about the process of approval  Patient was upset that she has been waiting to get approved. Apologized for the delay and that we are taking her health seriously. Advised that we have marked the appeal as urgent each time and continue to f/u with her insurance routinely for updates  Chesley Mires, PharmD, MPH Clinical Pharmacist (Rheumatology and Pulmonology)

## 2021-01-06 NOTE — Telephone Encounter (Signed)
Pt returning a phone call about her appeal for her ofev. Pt can be reached at [phone].

## 2021-01-06 NOTE — Telephone Encounter (Signed)
Pt needs a perscription sent to [facility] for her ofev once it is approved  It can e-scribed  faxed or called in phone to call in is [phone] or fax # [phone].Caren Griffins

## 2021-01-07 ENCOUNTER — Encounter: Payer: Self-pay | Admitting: Nurse Practitioner

## 2021-01-08 MED ORDER — OFEV 100 MG PO CAPS: 100.0000 mg | ORAL_CAPSULE | Freq: Two times a day (BID) | ORAL | 4 refills | Status: DC

## 2021-01-08 NOTE — Telephone Encounter (Signed)
Received notification from [employer] regarding a prior authorization for OFEV 100mg . Authorization has been APPROVED from 01/07/21 to 01/06/22.   Authorization # 01/08/22  Patient must fill through [facility]  Patient is eligible for Ofev copay card. She has been enrolled into [facility] Program. Phone# [phone].

## 2021-01-08 NOTE — Addendum Note (Signed)
Addended by: Murrell Redden on: 01/08/2021 03:16 PM   Modules accepted: Orders

## 2021-01-08 NOTE — Telephone Encounter (Signed)
Patient counseled on purpose, proper use, and potential adverse effects including diarrhea, nausea, vomiting, abdominal pain, decreased appetite, weight loss, and increased blood pressure. Stressed the importance of routine lab monitoring. Will monitor LFT's every month for the first 6 months of treatment then every 3 months. Will monitor CBC every 3 months.  Ofev dose will be 100 mg capsule every 12 hours with food. Stressed importance of taking with food to minimize stomach upset.    Patient was previously taking Esbriet - last dose was in December. Stopped taking d/t fatigue and dizziness while at work.   Patient reports she is still in the process of disability paperwork. She will be transitioning to Medicare coverage in about 2-3 months and states she has already enrolled into a plan. Advised that once she enrolls into Medicare, we will have to pursue patient assistance and will need income documents.  Patient verbalized understanding. Will plan to call [facility] today or Monday, 01/11/21 to connect with nurse as well as copay card information. Rx sent to [facility] as before, patient has phone number since this is where she was filling Esbriet.   Chesley Mires, PharmD, MPH Clinical Pharmacist (Rheumatology and Pulmonology)

## 2021-01-12 ENCOUNTER — Encounter: Payer: Self-pay | Admitting: Nurse Practitioner

## 2021-01-12 ENCOUNTER — Telehealth (INDEPENDENT_AMBULATORY_CARE_PROVIDER_SITE_OTHER): Payer: BC Managed Care – PPO | Admitting: Nurse Practitioner

## 2021-01-12 VITALS — Ht 60.0 in | Wt 136.0 lb

## 2021-01-12 DIAGNOSIS — I700 Atherosclerosis of aorta: Secondary | ICD-10-CM | POA: Insufficient documentation

## 2021-01-12 DIAGNOSIS — F5101 Primary insomnia: Secondary | ICD-10-CM

## 2021-01-12 DIAGNOSIS — M5137 Other intervertebral disc degeneration, lumbosacral region: Secondary | ICD-10-CM | POA: Diagnosis not present

## 2021-01-12 MED ORDER — TEMAZEPAM 7.5 MG PO CAPS: 7.5000 mg | ORAL_CAPSULE | Freq: Every evening | ORAL | 5 refills | Status: DC | PRN

## 2021-01-12 MED ORDER — ATORVASTATIN CALCIUM 20 MG PO TABS: 20.0000 mg | ORAL_TABLET | Freq: Every day | ORAL | 5 refills | Status: DC

## 2021-01-12 NOTE — Assessment & Plan Note (Addendum)
No improvement with trazodone. Improved and stable mood. Changed trazodone to restoril Advised about risk of over sedation, therefore she should not take gabapentin and restoril and hycodan within 4hrs of each other. Avoid ambien due to risk of hepatic toxicity and DDI with atorvastatin and ofev.

## 2021-01-12 NOTE — Progress Notes (Signed)
Virtual Visit via Video Note  I connected with@ on 01/12/21 at  9:00 AM EDT by a video enabled telemedicine application and verified that I am speaking with the correct person using two identifiers.  Location: Patient:Home Provider: Office Participants: patient and provider  I discussed the limitations of evaluation and management by telemedicine and the availability of in person appointments. I also discussed with the patient that there may be a patient responsible charge related to this service. The patient expressed understanding and agreed to proceed.  OZ:HYQMVHQI and anxiety f/up  History of Present Illness: Depression screen [facility] 2/9 01/12/2021 11/17/2020 05/26/2020  Decreased Interest 0 3 0  Down, Depressed, Hopeless 0 1 0  PHQ - 2 Score 0 4 0  Altered sleeping 3 3 -  Tired, decreased energy 0 3 -  Change in appetite 1 3 -  Feeling bad or failure about yourself  0 0 -  Trouble concentrating 0 0 -  Moving slowly or fidgety/restless 0 3 -  Suicidal thoughts 0 0 -  PHQ-9 Score 4 16 -  Difficult doing work/chores Not difficult at all Somewhat difficult -   GAD 7 : Generalized Anxiety Score 01/12/2021 11/17/2020  Nervous, Anxious, on Edge 0 0  Control/stop worrying 0 0  Worry too much - different things 0 0  Trouble relaxing 3 3  Restless 0 0  Easily annoyed or irritable 1 3  Afraid - awful might happen 0 0  Total GAD 7 Score 4 6  Anxiety Difficulty Not difficult at all Not difficult at all   Observations/Objective: Physical Exam Vitals reviewed.  Cardiovascular:     Rate and Rhythm: Normal rate.     Pulses: Normal pulses.  Neurological:     Mental Status: She is alert and oriented to person, place, and time.  Psychiatric:        Mood and Affect: Mood normal.        Behavior: Behavior normal.        Thought Content: Thought content normal.    Assessment and Plan: Arraya was seen today for follow-up.  Diagnoses and all orders for this visit:  Primary insomnia -      temazepam (RESTORIL) 7.5 MG capsule; Take 1 capsule (7.5 mg total) by mouth at bedtime as needed for sleep.  Atherosclerosis of aorta (HCC) -     atorvastatin (LIPITOR) 20 MG tablet; Take 1 tablet (20 mg total) by mouth at bedtime.   Follow Up Instructions: See above   I discussed the assessment and treatment plan with the patient. The patient was provided an opportunity to ask questions and all were answered. The patient agreed with the plan and demonstrated an understanding of the instructions.   The patient was advised to call back or seek an in-person evaluation if the symptoms worsen or if the condition fails to improve as anticipated.  Alysia Penna, NP

## 2021-01-12 NOTE — Assessment & Plan Note (Addendum)
Start atorvastatin 20mg  BP at goal Controlled DM with diet Repeat labs in 51months

## 2021-01-13 ENCOUNTER — Ambulatory Visit: Payer: BC Managed Care – PPO | Admitting: Pulmonary Disease

## 2021-01-13 NOTE — Telephone Encounter (Signed)
Patient's Ofev was delivered on 01/12/21 oer [facility].  Will f/u with patient in 1 month for labs

## 2021-01-13 NOTE — Telephone Encounter (Signed)
Called [facility] regarding Ofev prescription  Chesley Mires, PharmD, MPH Clinical Pharmacist (Rheumatology and Pulmonology)

## 2021-01-20 ENCOUNTER — Other Ambulatory Visit: Payer: Self-pay

## 2021-01-20 ENCOUNTER — Ambulatory Visit
Admission: RE | Admit: 2021-01-20 | Discharge: 2021-01-20 | Disposition: A | Discharge status: Home / Self Care | Payer: BC Managed Care – PPO | Source: Ambulatory Visit | Attending: Obstetrics | Admitting: Obstetrics

## 2021-01-20 ENCOUNTER — Other Ambulatory Visit: Payer: Self-pay | Admitting: Obstetrics

## 2021-01-20 DIAGNOSIS — R928 Other abnormal and inconclusive findings on diagnostic imaging of breast: Secondary | ICD-10-CM

## 2021-01-20 DIAGNOSIS — N6489 Other specified disorders of breast: Secondary | ICD-10-CM

## 2021-01-21 ENCOUNTER — Telehealth: Payer: Self-pay | Admitting: Pulmonary Disease

## 2021-01-22 ENCOUNTER — Ambulatory Visit
Admission: RE | Admit: 2021-01-22 | Discharge: 2021-01-22 | Disposition: A | Discharge status: Home / Self Care | Payer: BC Managed Care – PPO | Source: Ambulatory Visit | Attending: Obstetrics | Admitting: Obstetrics

## 2021-01-22 ENCOUNTER — Other Ambulatory Visit: Payer: Self-pay

## 2021-01-22 DIAGNOSIS — N6489 Other specified disorders of breast: Secondary | ICD-10-CM

## 2021-01-22 HISTORY — PX: BREAST BIOPSY: SHX20

## 2021-01-23 ENCOUNTER — Encounter: Payer: Self-pay | Admitting: Nurse Practitioner

## 2021-01-28 NOTE — Telephone Encounter (Signed)
Rec'd completed form back - faxed to [employer] at [phone] - mailed copy to pt home per her request. -pr

## 2021-02-08 ENCOUNTER — Telehealth (HOSPITAL_COMMUNITY): Payer: Self-pay

## 2021-02-08 NOTE — Telephone Encounter (Signed)
Called patient to see if she is interested in the Pulmonary Rehab Program. Patient expressed interest. Explained scheduling process and went over insurance, patient verbalized understanding. Also adv pt where we are with scheduling for PR and that we have a backlog 1-3 months. 

## 2021-02-08 NOTE — Telephone Encounter (Signed)
Pt insurance is active and benefits verified through [place] [place]. Co-pay $0.00, DED $4,000.00/$4,000.00 met, out of pocket $6,000.00/$6,000.00 met, co-insurance 0%. No pre-authorization required. Susan/[employer], 02/08/21 @ 1:55PM, [phone]  Will contact patient to see if she is interested in the Pulmonary Rehab Program.

## 2021-02-18 ENCOUNTER — Telehealth: Payer: Self-pay | Admitting: Pharmacist

## 2021-02-18 NOTE — Telephone Encounter (Signed)
Called and spoke with patient. She has been scheduled to see Dr. Isaiah Serge on 02/22/21 at 945am. She verbalized understanding of appt time and location.   Nothing further needed at time of call.

## 2021-02-18 NOTE — Telephone Encounter (Signed)
Spoke with patient regarding Ofev since starting. She states she had some diarrhea initially but it has resolved.  She states that she has shortness of breath when climbing stairs since her bedroom is on the second floor. She states she is still working on disability paperwork - but now working with Tree surgeon.  Patient advised that she is overdue for f/u appt with Dr. Isaiah Serge and labs and she verbalized understanding that the scheduling team will reach out for appt. Will route to triage to have patient scheduled.  Chesley Mires, PharmD, MPH Clinical Pharmacist (Rheumatology and Pulmonology)

## 2021-02-22 ENCOUNTER — Encounter: Payer: Self-pay | Admitting: *Deleted

## 2021-02-22 ENCOUNTER — Encounter: Payer: Self-pay | Admitting: Pulmonary Disease

## 2021-02-22 ENCOUNTER — Other Ambulatory Visit: Payer: Self-pay

## 2021-02-22 ENCOUNTER — Ambulatory Visit (INDEPENDENT_AMBULATORY_CARE_PROVIDER_SITE_OTHER): Payer: BC Managed Care – PPO | Admitting: Pulmonary Disease

## 2021-02-22 VITALS — BP 122/62 | HR 83 | Temp 97.7°F | Ht 60.0 in | Wt 139.0 lb

## 2021-02-22 DIAGNOSIS — J84112 Idiopathic pulmonary fibrosis: Secondary | ICD-10-CM | POA: Diagnosis not present

## 2021-02-22 DIAGNOSIS — Z5181 Encounter for therapeutic drug level monitoring: Secondary | ICD-10-CM

## 2021-02-22 LAB — HEPATIC FUNCTION PANEL
ALT: 11 U/L (ref 0–35)
AST: 22 U/L (ref 0–37)
Albumin: 4.5 g/dL (ref 3.5–5.2)
Alkaline Phosphatase: 54 U/L (ref 39–117)
Bilirubin, Direct: 0.1 mg/dL (ref 0.0–0.3)
Total Bilirubin: 0.4 mg/dL (ref 0.2–1.2)
Total Protein: 8.1 g/dL (ref 6.0–8.3)

## 2021-02-22 NOTE — Addendum Note (Signed)
Addended by: Demetrio Lapping E on: 02/22/2021 10:10 AM   Modules accepted: Orders

## 2021-02-22 NOTE — Progress Notes (Signed)
Sandra Curtis    [id number]    August 12, 1956  Primary Care Physician:Nche, Bonna Gains, NP  Referring Physician: [name], NP [address] [place],  [place] [address]  Chief complaint: Follow-up for IPF Did not tolerate [name] April 2022  HPI: 65 year old with history of allergies, GERD, esophageal stricture Complains of chronic cough for several years, worsening dyspnea on exertion. She had a CT scan done by Dr. Elease Etienne, primary care showing pulmonary fibrosis and has been referred for further evaluation.  Previously followed by Dr. Russella Dar, GI with EGD in 2004 showing esophageal stricture secondary to acid reflux.  She has not followed up since then. Started on Nexium in August 2021 by primary care.  Has followed up with Dr. Russella Dar, GI who has increased her Protonix to 40 mg twice daily and scheduled EGD Has also seen Dr. Dimple Casey, rheumatology for evaluation of forced of ANA and SSA.  He feels she does not have any signs of autoimmune disease.  Started Esbriet in December 2021.  But did not tolerate even at a low dose due to dizziness and headache.  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  No mold, hot tub, Jacuzzi.  No feather pillows or comforters Smoking history: 18-pack-year smoker.  Quit 1 month ago Travel history: No significant travel history Relevant family history: No significant family issue of lung disease  Interval history: Underwent insurance appeal for Ofev which was successful Antifibrotic switched to Ofev in April 2022 She is tolerating this better  Continues to have dyspnea on exertion She has been struggling with mood changes and depression with her new diagnosis Currently on temporary disability.  Outpatient Encounter Medications as of 02/22/2021  Medication Sig  . acetaminophen (TYLENOL)  650 MG CR tablet Take 650 mg by mouth every 8 (eight) hours as needed for pain.  Marland Kitchen albuterol (VENTOLIN HFA) 108 (90 Base) MCG/ACT inhaler Inhale 1-2 puffs into the lungs every 6 (six) hours as needed.  Marland Kitchen atorvastatin (LIPITOR) 20 MG tablet Take 1 tablet (20 mg total) by mouth at bedtime.  . Cholecalciferol (VITAMIN D3) 25 MCG (1000 UT) CAPS Take by mouth.  . gabapentin (NEURONTIN) 100 MG capsule Take 3 capsules (300 mg total) by mouth at bedtime as needed.  Marland Kitchen HYDROcodone-homatropine (HYCODAN) 5-1.5 MG/5ML syrup Take 5 mLs by mouth every 6 (six) hours as needed for cough.  . Nintedanib (OFEV) 100 MG CAPS Take 1 capsule (100 mg total) by mouth 2 (two) times daily.  . pantoprazole (PROTONIX) 40 MG tablet Take 1 tablet (40 mg total) by mouth 2 (two) times daily before a meal.  . Spacer/Aero-Holding Chambers (AEROCHAMBER PLUS FLO-VU W/MASK) MISC USE AS DIRECTED ONCE FOR 1 DOSE  . temazepam (RESTORIL) 7.5 MG capsule Take 1 capsule (7.5 mg total) by mouth at bedtime as needed for sleep.   No facility-administered encounter medications on file as of 02/22/2021.    Physical Exam: Blood pressure 122/62, pulse 83, temperature 97.7 F (36.5 C), temperature source Temporal, height 5' (1.524 m), weight 139 lb (63 kg), SpO2 99 %. Gen:      No acute distress HEENT:  EOMI, sclera anicteric Neck:     No masses; no thyromegaly Lungs:    Clear to auscultation bilaterally; normal respiratory effort CV:         Regular rate and rhythm; no murmurs  Abd:      + bowel sounds; soft, non-tender; no palpable masses, no distension Ext:    No edema; adequate peripheral perfusion Skin:      Warm and dry; no rash Neuro: alert and oriented x 3 Psych: normal mood and affect  Data Reviewed: Imaging: Chest x-ray 05/15/2020-diffuse interstitial opacities CT chest 06/17/2020-reticulation with traction bronchiectasis, honeycombing most prominent in the right lung base.  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  PFTs: 09/09/2020 FVC 1.80  [85%], FEV1 1.55 [94%], F/F 86, TLC 3.05 [60%], DLCO 10.61 [61%] Mild-moderate restriction and diffusion defect  Labs: ANA 06/27/2019 1:40, cytoplasmic, rheumatoid factor less than 14 ANA 07/16/2020-1:40, SSA greater than 8  Assessment:  IPF CT with UIP pattern fibrosis. She does have mild arthritis symptoms which may be secondary to osteoarthritis but does endorse some morning stiffness and small joint pain.  She had some occupational exposures to textile fiber and dust but no asbestos. Her exposures are not a cause of the pulmonary finrosis No evidence of autoimmune process, connective tissue disease per rheumatology evaluation.  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  Awaiting initiation of pulmonary rehab Monitor hepatic panel She may need referral for transplant eventually.  Depression She is still struggling with her new diagnosis with feelings of depression I have offered referral to psychiatry but she wants to hold off for now Have encouraged her to follow-up with primary care  GERD History notable for significant GERD symptoms, reflux and esophageal stricture.  Follows with Dr. Russella Dar.    Plan/Recommendations: Continue Durel Salts, check hepatic panel Pulmonary rehab  Chilton Greathouse MD [facility] 02/22/2021, 9:53 AM  CC: Nche, Bonna Gains, NP

## 2021-02-22 NOTE — Patient Instructions (Signed)
Please discuss with your primary care provider depression and mood.  We can make referral to psychiatry if you wish  Please contact the patient support group at [email or web address]  Continue Ofev We will check your liver tests monthly for the next 6 months  Follow-up in 6 months.

## 2021-02-25 ENCOUNTER — Telehealth (HOSPITAL_COMMUNITY): Payer: Self-pay

## 2021-02-25 NOTE — Telephone Encounter (Signed)
Called patient to see if she was interested in participating in the Pulmonary Rehab Program. Patient stated yes. Patient will come in for orientation on 03/15/21 @ 9AM and will attend the 1:15 exercise class.  Pensions consultant.

## 2021-03-01 ENCOUNTER — Encounter: Payer: Self-pay | Admitting: Gastroenterology

## 2021-03-01 ENCOUNTER — Ambulatory Visit (INDEPENDENT_AMBULATORY_CARE_PROVIDER_SITE_OTHER): Payer: BC Managed Care – PPO | Admitting: Gastroenterology

## 2021-03-01 VITALS — BP 112/64 | HR 88 | Ht 59.75 in | Wt 140.2 lb

## 2021-03-01 DIAGNOSIS — K2100 Gastro-esophageal reflux disease with esophagitis, without bleeding: Secondary | ICD-10-CM | POA: Diagnosis not present

## 2021-03-01 DIAGNOSIS — R079 Chest pain, unspecified: Secondary | ICD-10-CM

## 2021-03-01 MED ORDER — FAMOTIDINE 40 MG PO TABS: 40.0000 mg | ORAL_TABLET | Freq: Every day | ORAL | 11 refills | Status: DC

## 2021-03-01 MED ORDER — ESOMEPRAZOLE MAGNESIUM 40 MG PO CPDR: 40.0000 mg | DELAYED_RELEASE_CAPSULE | Freq: Two times a day (BID) | ORAL | 11 refills | Status: DC

## 2021-03-01 NOTE — Progress Notes (Signed)
    History of Present Illness: This is a 65 year old female complaining of burning in her chest and throat.  She states her reflux symptoms significantly improved on pantoprazole however pantoprazole has not been as effective recently.  She describes a burning sensation across her upper chest and her throat which is not necessarily related to meals.   EGD 10/2020 - LA Grade C reflux esophagitis with no bleeding. - Esophageal plaques were found, suspicious for candidiasis. Biopsied. - Benign-appearing esophageal stenosis. Dilated. - Medium-sized hiatal hernia. - Normal duodenal bulb and second portion of the duodenum.  Current Medications, Allergies, Past Medical History, Past Surgical History, Family History and Social History were reviewed in [employer] record.   Physical Exam: General: Well developed, well nourished, no acute distress Head: Normocephalic and atraumatic Eyes: Sclerae anicteric, EOMI Ears: Normal auditory acuity Mouth: Not examined, mask on during Covid-19 pandemic Lungs: Clear throughout to auscultation Heart: Regular rate and rhythm; no murmurs, rubs or bruits Abdomen: Soft, non tender and non distended. No masses, hepatosplenomegaly or hernias noted. Normal Bowel sounds Rectal: Not done Musculoskeletal: Symmetrical with no gross deformities  Pulses:  Normal pulses noted Extremities: No clubbing, cyanosis, edema or deformities noted Neurological: Alert oriented x 4, grossly nonfocal Psychological:  Alert and cooperative. Normal mood and affect   Assessment and Recommendations:  1. GERD with LA Class C esophagitis.  Chest and throat pain.  Her symptoms are likely a combination of reflux and pulmonary.  Change to Nexium 40 mg twice daily.  Begin famotidine 40 mg at bedtime.  Closely follow antireflux measures.  Begin the use of 4 inch bed blocks for long-term GERD management.  REV in 3 months.   2. IPF. Follow up with Dr. Isaiah Serge.

## 2021-03-01 NOTE — Patient Instructions (Addendum)
Stop taking pantoprazole.  We have sent the following medications to your pharmacy for you to pick up at your convenience: Nexium 40 mg twice daily and famotidine 40 mg at bedtime.   Patient advised to avoid spicy, acidic, citrus, chocolate, mints, fruit and fruit juices.  Limit the intake of caffeine, alcohol and Soda.  Don't exercise too soon after eating.  Don't lie down within 3-4 hours of eating.  Elevate the head of your bed.  Thank you for choosing me and [facility].  Sandra Curtis. Pleas Koch., MD., Clementeen Graham

## 2021-03-03 ENCOUNTER — Telehealth: Payer: Self-pay | Admitting: Pulmonary Disease

## 2021-03-10 NOTE — Telephone Encounter (Signed)
Sent message to Dr. Isaiah Serge to see best way to have him received document for signature. -pr

## 2021-03-12 ENCOUNTER — Telehealth (HOSPITAL_COMMUNITY): Payer: Self-pay

## 2021-03-12 NOTE — Telephone Encounter (Signed)
Attempted to contact pt to confirm [facility] appt 03/15/21 @ 9AM. LMTCB

## 2021-03-15 ENCOUNTER — Encounter (HOSPITAL_COMMUNITY)
Admission: RE | Admit: 2021-03-15 | Discharge: 2021-03-15 | Disposition: A | Discharge status: Home / Self Care | Payer: BC Managed Care – PPO | Source: Ambulatory Visit | Attending: Pulmonary Disease | Admitting: Pulmonary Disease

## 2021-03-15 ENCOUNTER — Other Ambulatory Visit: Payer: Self-pay

## 2021-03-15 ENCOUNTER — Encounter (HOSPITAL_COMMUNITY): Payer: Self-pay

## 2021-03-15 VITALS — BP 148/70 | HR 66 | Ht 60.5 in | Wt 140.7 lb

## 2021-03-15 DIAGNOSIS — J8410 Pulmonary fibrosis, unspecified: Secondary | ICD-10-CM | POA: Insufficient documentation

## 2021-03-15 NOTE — Progress Notes (Signed)
Pulmonary Individual Treatment Plan  Patient Details  Name: Sandra Curtis MRN: [id number] Date of Birth: Mar 13, 1956 Referring Provider:   Doristine Devoid Pulmonary Rehab Walk Test from 03/15/2021 in [facility] CARDI[facility]  Referring Provider Dr. Isaiah Serge      Initial Encounter Date:  Flowsheet Row Pulmonary Rehab Walk Test from 03/15/2021 in [facility] CARD[facility]HAB  Date 03/15/21      Visit Diagnosis: Pulmonary fibrosis (HCC)  Patient's Home Medications on Admission:   Current Outpatient Medications:  .  acetaminophen (TYLENOL) 650 MG CR tablet, Take 650 mg by mouth every 8 (eight) hours as needed for pain., Disp: , Rfl:  .  albuterol (VENTOLIN HFA) 108 (90 Base) MCG/ACT inhaler, Inhale 1-2 puffs into the lungs every 6 (six) hours as needed., Disp: 6.7 g, Rfl: 0 .  atorvastatin (LIPITOR) 20 MG tablet, Take 1 tablet (20 mg total) by mouth at bedtime., Disp: 30 tablet, Rfl: 5 .  Cholecalciferol (VITAMIN D3) 25 MCG (1000 UT) CAPS, Take by mouth., Disp: , Rfl:  .  famotidine (PEPCID) 40 MG tablet, Take 1 tablet (40 mg total) by mouth at bedtime., Disp: 30 tablet, Rfl: 11 .  gabapentin (NEURONTIN) 100 MG capsule, Take 3 capsules (300 mg total) by mouth at bedtime as needed., Disp: 90 capsule, Rfl: 5 .  HYDROcodone-homatropine (HYCODAN) 5-1.5 MG/5ML syrup, Take 5 mLs by mouth every 6 (six) hours as needed for cough., Disp: 240 mL, Rfl: 0 .  Nintedanib (OFEV) 100 MG CAPS, Take 1 capsule (100 mg total) by mouth 2 (two) times daily., Disp: 60 capsule, Rfl: 4 .  predniSONE (DELTASONE) 10 MG tablet, Take 10 mg by mouth daily with breakfast., Disp: , Rfl:  .  Spacer/Aero-Holding Chambers (AEROCHAMBER PLUS FLO-VU W/MASK) MISC, USE AS DIRECTED ONCE FOR 1 DOSE, Disp: 1 each, Rfl: 0 .  temazepam (RESTORIL) 7.5 MG capsule, Take 1 capsule (7.5 mg total) by mouth at bedtime as needed for sleep., Disp: 30 capsule, Rfl: 5 .  esomeprazole (NEXIUM) 40 MG capsule, Take  1 capsule (40 mg total) by mouth 2 (two) times daily before a meal. (Patient not taking: Reported on 03/15/2021), Disp: 60 capsule, Rfl: 11  Past Medical History: Past Medical History:  Diagnosis Date  . Allergic rhinitis   . Allergy   . Chronic back pain 07/20/2018  . Chronic midline low back pain 02/06/2017  . Depression   . Esophageal stricture   . GERD (gastroesophageal reflux disease)   . Inflammatory polyps of colon (HCC) 2010  . Pulmonary filariasis   . Systolic murmur   . UTI (urinary tract infection)     Tobacco Use: Social History   Tobacco Use  Smoking Status Former Smoker  . Packs/day: 0.50  . Years: 35.00  . Pack years: 17.50  . Types: Cigarettes  . Quit date: 04/12/2018  . Years since quitting: 2.9  Smokeless Tobacco Never Used    Labs: Recent Review Flowsheet Data    Labs for ITP Cardiac and Pulmonary Rehab Latest Ref Rng & Units 06/12/2018 12/12/2018 11/27/2019 05/26/2020 11/17/2020   Cholestrol 0 - 200 mg/dL - - 301 - 601   LDLCALC 0 - 99 mg/dL - - 093(A) - 94   HDL >35.57 mg/dL - - 32.20 - 25.42   Trlycerides 0.0 - 149.0 mg/dL - - 70.6 - 23.7   Hemoglobin A1c 4.6 - 6.5 % 6.4 5.9(A) 7.2(H) 6.5 6.6(H)      Capillary Blood Glucose: No results found for: GLUCAP  Pulmonary Assessment Scores:  Pulmonary Assessment Scores    Row Name 03/15/21 1200         ADL UCSD   ADL Phase Entry     SOB Score total 82           CAT Score   CAT Score 30           mMRC Score   mMRC Score 4           UCSD: Self-administered rating of dyspnea associated with activities of daily living (ADLs) 6-point scale (0 = "not at all" to 5 = "maximal or unable to do because of breathlessness")  Scoring Scores range from 0 to 120.  Minimally important difference is 5 units  CAT: CAT can identify the health impairment of COPD patients and is better correlated with disease progression.  CAT has a scoring range of zero to 40. The CAT score is classified into four groups of low  (less than 10), medium (10 - 20), high (21-30) and very high (31-40) based on the impact level of disease on health status. A CAT score over 10 suggests significant symptoms.  A worsening CAT score could be explained by an exacerbation, poor medication adherence, poor inhaler technique, or progression of COPD or comorbid conditions.  CAT MCID is 2 points  mMRC: mMRC (Modified Medical Research Council) Dyspnea Scale is used to assess the degree of baseline functional disability in patients of respiratory disease due to dyspnea. No minimal important difference is established. A decrease in score of 1 point or greater is considered a positive change.   Pulmonary Function Assessment:  Pulmonary Function Assessment - 03/15/21 1032      Breath   Shortness of Breath Yes;Limiting activity           Exercise Target Goals: Exercise Program Goal: Individual exercise prescription set using results from initial 6 min walk test and THRR while considering  patient's activity barriers and safety.   Exercise Prescription Goal: Initial exercise prescription builds to 30-45 minutes a day of aerobic activity, 2-3 days per week.  Home exercise guidelines will be given to patient during program as part of exercise prescription that the participant will acknowledge.  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 HR 128 bpm     Max Ex. BP 152/72     2 Minute Post BP 142/66  Interval HR   1 Minute HR 100     2 Minute HR 117     3 Minute HR 107     4 Minute HR 114     5 Minute HR 121     6 Minute HR 128     2 Minute Post HR 84     Interval Heart Rate? Yes           Interval Oxygen   Interval Oxygen? Yes     Baseline Oxygen Saturation % 97 %     1 Minute Oxygen Saturation % 98 %     1 Minute Liters of Oxygen 0 L     2 Minute Oxygen Saturation % 92 %     2 Minute Liters of Oxygen 0 L     3 Minute Oxygen Saturation % 97 %     3 Minute Liters of Oxygen 0 L     4 Minute Oxygen Saturation % 97 %     4 Minute Liters of Oxygen 0 L     5 Minute Oxygen Saturation % 93 %     5 Minute Liters of Oxygen 0 L     6 Minute Oxygen Saturation % 93 %     6 Minute Liters of Oxygen 0 L     2 Minute Post Oxygen Saturation % 99 %     2 Minute Post Liters of Oxygen 0 L            Oxygen Initial Assessment:  Oxygen Initial Assessment - 03/15/21 1031      Home Oxygen   Home Oxygen Device None    Sleep Oxygen Prescription None    Home Exercise Oxygen Prescription None    Home Resting Oxygen Prescription None      Initial 6 min Walk   Oxygen Used None      Program Oxygen Prescription   Program Oxygen Prescription None      Intervention   Short Term Goals To learn and understand importance of monitoring SPO2 with pulse oximeter and demonstrate accurate use of the pulse oximeter.;To learn and understand importance of maintaining oxygen saturations>88%;To learn and demonstrate proper pursed lip breathing techniques or other breathing techniques.;To learn and demonstrate proper use of respiratory medications    Long  Term Goals Verbalizes importance of monitoring SPO2 with pulse oximeter and return demonstration;Maintenance of O2 saturations>88%;Exhibits proper breathing techniques, such as  pursed lip breathing or other method taught during program session;Compliance with respiratory medication;Demonstrates proper use of MDI's           Oxygen Re-Evaluation:   Oxygen Discharge (Final Oxygen Re-Evaluation):   Initial Exercise Prescription:  Initial Exercise Prescription - 03/15/21 1200      Date of Initial Exercise RX and Referring Provider   Date 03/15/21    Referring Provider Dr. Isaiah Serge    Expected Discharge Date 05/20/21      NuStep   Level 1    SPM 80    Minutes 15      Track   Minutes 15      Prescription Details   Frequency (times per week) 2    Duration Progress to 30 minutes of continuous aerobic without signs/symptoms of physical distress      Intensity   THRR 40-80% of Max Heartrate 62-124    Ratings of Perceived Exertion 11-13    Perceived Dyspnea 0-4      Progression   Progression Continue to progress workloads to maintain  intensity without signs/symptoms of physical distress.      Resistance Training   Training Prescription Yes    Weight Red bands    Reps 10-15           Perform Capillary Blood Glucose checks as needed.  Exercise Prescription Changes:   Exercise Comments:   Exercise Goals and Review:  Exercise Goals    Row Name 03/15/21 1143             Exercise Goals   Increase Physical Activity Yes       Intervention Provide advice, education, support and counseling about physical activity/exercise needs.;Develop an individualized exercise prescription for aerobic and resistive training based on initial evaluation findings, risk stratification, comorbidities and participant's personal goals.       Expected Outcomes Short Term: Attend rehab on a regular basis to increase amount of physical activity.;Long Term: Add in home exercise to make exercise part of routine and to increase amount of physical activity.;Long Term: Exercising regularly at least 3-5 days a week.       Increase Strength and Stamina Yes        Intervention Provide advice, education, support and counseling about physical activity/exercise needs.;Develop an individualized exercise prescription for aerobic and resistive training based on initial evaluation findings, risk stratification, comorbidities and participant's personal goals.       Expected Outcomes Short Term: Increase workloads from initial exercise prescription for resistance, speed, and METs.;Short Term: Perform resistance training exercises routinely during rehab and add in resistance training at home;Long Term: Improve cardiorespiratory fitness, muscular endurance and strength as measured by increased METs and functional capacity (6MWT)       Able to understand and use rate of perceived exertion (RPE) scale Yes       Intervention Provide education and explanation on how to use RPE scale       Expected Outcomes Short Term: Able to use RPE daily in rehab to express subjective intensity level;Long Term:  Able to use RPE to guide intensity level when exercising independently       Able to understand and use Dyspnea scale Yes       Intervention Provide education and explanation on how to use Dyspnea scale       Expected Outcomes Short Term: Able to use Dyspnea scale daily in rehab to express subjective sense of shortness of breath during exertion;Long Term: Able to use Dyspnea scale to guide intensity level when exercising independently       Knowledge and understanding of Target Heart Rate Range (THRR) Yes       Intervention Provide education and explanation of THRR including how the numbers were predicted and where they are located for reference       Expected Outcomes Short Term: Able to state/look up THRR;Long Term: Able to use THRR to govern intensity when exercising independently;Short Term: Able to use daily as guideline for intensity in rehab       Understanding of Exercise Prescription Yes       Intervention Provide education, explanation, and written materials on patient's  individual exercise prescription       Expected Outcomes Short Term: Able to explain program exercise prescription;Long Term: Able to explain home exercise prescription to exercise independently              Exercise Goals Re-Evaluation :   Discharge Exercise Prescription (Final Exercise Prescription Changes):   Nutrition:  Target Goals: Understanding of nutrition guidelines, daily intake of sodium 1500mg , cholesterol 200mg ,  calories 30% from fat and 7% or less from saturated fats, daily to have 5 or more servings of fruits and vegetables.  Biometrics:  Pre Biometrics - 03/15/21 1144      Pre Biometrics   Grip Strength 27 kg            Nutrition Therapy Plan and Nutrition Goals:   Nutrition Assessments:  MEDIFICTS Score Key:  ?70 Need to make dietary changes   40-70 Heart Healthy Diet  ? 40 Therapeutic Level Cholesterol Diet   Picture Your Plate Scores:  <16 Unhealthy dietary pattern with much room for improvement.  41-50 Dietary pattern unlikely to meet recommendations for good health and room for improvement.  51-60 More healthful dietary pattern, with some room for improvement.   >60 Healthy dietary pattern, although there may be some specific behaviors that could be improved.    Nutrition Goals Re-Evaluation:   Nutrition Goals Discharge (Final Nutrition Goals Re-Evaluation):   Psychosocial: Target Goals: Acknowledge presence or absence of significant depression and/or stress, maximize coping skills, provide positive support system. Participant is able to verbalize types and ability to use techniques and skills needed for reducing stress and depression.  Initial Review & Psychosocial Screening:  Initial Psych Review & Screening - 03/15/21 1032      Initial Review   Current issues with Current Depression;Current Sleep Concerns      Family Dynamics   Good Support System? Yes   Family, daughter, and mom     Barriers   Psychosocial barriers to  participate in program The patient should benefit from training in stress management and relaxation.      Screening Interventions   Interventions Encouraged to exercise    Expected Outcomes Long Term Goal: Stressors or current issues are controlled or eliminated.;Short Term goal: Identification and review with participant of any Quality of Life or Depression concerns found by scoring the questionnaire.;Long Term goal: The participant improves quality of Life and PHQ9 Scores as seen by post scores and/or verbalization of changes           Quality of Life Scores:  Scores of 19 and below usually indicate a poorer quality of life in these areas.  A difference of  2-3 points is a clinically meaningful difference.  A difference of 2-3 points in the total score of the Quality of Life Index has been associated with significant improvement in overall quality of life, self-image, physical symptoms, and general health in studies assessing change in quality of life.  PHQ-9: Recent Review Flowsheet Data    Depression screen [facility] 2/9 03/15/2021 01/12/2021 11/17/2020 05/26/2020 11/27/2019   Decreased Interest 3 0 3 0 0   Down, Depressed, Hopeless 1 0 1 0 0   PHQ - 2 Score 4 0 4 0 0   Altered sleeping - -   Tired, decreased energy 1 0 3 - -   Change in appetite - -   Feeling bad or failure about yourself  0 0 0 - -   Trouble concentrating 0 0 0 - -   Moving slowly or fidgety/restless 0 0 3 - -   Suicidal thoughts 0 0 0 - -   PHQ-9 Score - -   Difficult doing work/chores Somewhat difficult Not difficult at all Somewhat difficult - -     Interpretation of Total Score  Total Score Depression Severity:  1-4 = Minimal depression, 5-9 = Mild depression, 10-14 = Moderate depression, 15-19 =  Moderately severe depression, 20-27 = Severe depression   Psychosocial Evaluation and Intervention:  Psychosocial Evaluation - 03/15/21 1145      Psychosocial Evaluation & Interventions   Interventions  Encouraged to exercise with the program and follow exercise prescription    Comments Patient does admit to having depression and states it is due to her health an inability to do things. She was diagnosed the pulmonary fibrosis in December of 2021, so it is fairly recent and she is learning to deal with it. She states she cried for weeks after finding out her diagnosis. She currently does take medication for depression and has been referred to a counselor by her doctor.    Expected Outcomes For pt to learn healthy ways to cope with stressors and to manage depression symptoms in healthy ways. Also to improve pt's quality of life by decreasing shortness of breath in order for pt to be able to do more things she enjoys.    Continue Psychosocial Services  Follow up required by staff           Psychosocial Re-Evaluation:   Psychosocial Discharge (Final Psychosocial Re-Evaluation):   Education: Education Goals: Education classes will be provided on a weekly basis, covering required topics. Participant will state understanding/return demonstration of topics presented.  Learning Barriers/Preferences:  Learning Barriers/Preferences - 03/15/21 1033      Learning Barriers/Preferences   Learning Barriers None    Learning Preferences Individual Instruction;Skilled Demonstration           Education Topics: Risk Factor Reduction:  -Group instruction that is supported by a PowerPoint presentation. Instructor discusses the definition of a risk factor, different risk factors for pulmonary disease, and how the heart and lungs work together.     Nutrition for Pulmonary Patient:  -Group instruction provided by PowerPoint slides, verbal discussion, and written materials to support subject matter. The instructor gives an explanation and review of healthy diet recommendations, which includes a discussion on weight management, recommendations for fruit and vegetable consumption, as well as protein, fluid,  caffeine, fiber, sodium, sugar, and alcohol. Tips for eating when patients are short of breath are discussed.   Pursed Lip Breathing:  -Group instruction that is supported by demonstration and informational handouts. Instructor discusses the benefits of pursed lip and diaphragmatic breathing and detailed demonstration on how to preform both.     Oxygen Safety:  -Group instruction provided by PowerPoint, verbal discussion, and written material to support subject matter. There is an overview of "What is Oxygen" and "Why do we need it".  Instructor also reviews how to create a safe environment for oxygen use, the importance of using oxygen as prescribed, and the risks of noncompliance. There is a brief discussion on traveling with oxygen and resources the patient may utilize.   Oxygen Equipment:  -Group instruction provided by [facility] Staff utilizing handouts, written materials, and equipment demonstrations.   Signs and Symptoms:  -Group instruction provided by written material and verbal discussion to support subject matter. Warning signs and symptoms of infection, stroke, and heart attack are reviewed and when to call the physician/911 reinforced. Tips for preventing the spread of infection discussed.   Advanced Directives:  -Group instruction provided by verbal instruction and written material to support subject matter. Instructor reviews Advanced Directive laws and proper instruction for filling out document.   Pulmonary Video:  -Group video education that reviews the importance of medication and oxygen compliance, exercise, good nutrition, pulmonary hygiene, and pursed lip and diaphragmatic breathing for  the pulmonary patient.   Exercise for the Pulmonary Patient:  -Group instruction that is supported by a PowerPoint presentation. Instructor discusses benefits of exercise, core components of exercise, frequency, duration, and intensity of an exercise routine, importance of utilizing  pulse oximetry during exercise, safety while exercising, and options of places to exercise outside of rehab.     Pulmonary Medications:  -Verbally interactive group education provided by instructor with focus on inhaled medications and proper administration.   Anatomy and Physiology of the Respiratory System and Intimacy:  -Group instruction provided by PowerPoint, verbal discussion, and written material to support subject matter. Instructor reviews respiratory cycle and anatomical components of the respiratory system and their functions. Instructor also reviews differences in obstructive and restrictive respiratory diseases with examples of each. Intimacy, Sex, and Sexuality differences are reviewed with a discussion on how relationships can change when diagnosed with pulmonary disease. Common sexual concerns are reviewed.   MD DAY -A group question and answer session with a medical doctor that allows participants to ask questions that relate to their pulmonary disease state.   OTHER EDUCATION -Group or individual verbal, written, or video instructions that support the educational goals of the pulmonary rehab program.   Holiday Eating Survival Tips:  -Group instruction provided by PowerPoint slides, verbal discussion, and written materials to support subject matter. The instructor gives patients tips, tricks, and techniques to help them not only survive but enjoy the holidays despite the onslaught of food that accompanies the holidays.   Knowledge Questionnaire Score:  Knowledge Questionnaire Score - 03/15/21 1158      Knowledge Questionnaire Score   Pre Score 15/18           Core Components/Risk Factors/Patient Goals at Admission:  Personal Goals and Risk Factors at Admission - 03/15/21 1150      Core Components/Risk Factors/Patient Goals on Admission    Weight Management Weight Loss    Improve shortness of breath with ADL's Yes    Intervention Provide education,  individualized exercise plan and daily activity instruction to help decrease symptoms of SOB with activities of daily living.    Expected Outcomes Short Term: Improve cardiorespiratory fitness to achieve a reduction of symptoms when performing ADLs;Long Term: Be able to perform more ADLs without symptoms or delay the onset of symptoms    Lipids Yes    Intervention Provide education and support for participant on nutrition & aerobic/resistive exercise along with prescribed medications to achieve LDL 70mg , HDL >40mg .    Expected Outcomes Short Term: Participant states understanding of desired cholesterol values and is compliant with medications prescribed. Participant is following exercise prescription and nutrition guidelines.;Long Term: Cholesterol controlled with medications as prescribed, with individualized exercise RX and with personalized nutrition plan. Value goals: LDL < , HDL > 40 mg.           Core Components/Risk Factors/Patient Goals Review:    Core Components/Risk Factors/Patient Goals at Discharge (Final Review):    ITP Comments:   Comments:

## 2021-03-15 NOTE — Telephone Encounter (Signed)
Rec'd signed form - faxed to [employer] at [phone] -pr

## 2021-03-15 NOTE — Progress Notes (Signed)
Sandra Curtis 65 y.o. female Pulmonary Rehab Orientation Note This patient who was referred to Pulmonary rehab by Dr. Isaiah Serge with the diagnosis of Pulmonary Fibrosis arrived today in Cardiac and Pulmonary Rehab. She arrived ambulatory with steady gait. She does not carry portable oxygen. Per pt she uses oxygen never. Color good, skin warm and dry. Patient is oriented to time and place. Patient's medical history, psychosocial health, and medications reviewed. Psychosocial assessment reveals pt lives alone. Pt is currently disabled. Pt reports her stress level is moderate. Areas of stress/anxiety include Health Finances.  Pt does exhibit signs of depression. Signs of depression include crying spells and sadness and difficulty falling asleep. PHQ2/9 score 4/5. Pt shows fair coping skills with positive outlook. Pt states she does take medication for depression when needed and she has also been referred to a counselor for depression. She is in the process of setting up an appointment with a counselor. Dondra Spry was offered emotional support and reassurance. Will continue to monitor and evaluate progress toward psychosocial goal(s) of finding healthy ways to cope with stressors and anxiety and to also improve quality of life as shown by a decrease in PHQ9 scores. Patient reports she does take medications as prescribed. Patient states she follows a Regular diet. The patient reports no specific efforts to gain or lose weight. Patient's weight will be monitored closely. Demonstration and practice of PLB using pulse oximeter. Patient able to return demonstration satisfactorily. Safety and hand hygiene in the exercise area reviewed with patient. Patient voices understanding of the information reviewed. Department expectations discussed with patient and achievable goals were set. The patient shows enthusiasm about attending the program and we look forward to working with this nice lady. The patient completed a 6 min walk  test today and is scheduled to begin exercise on 03/23/21.  45 minutes was spent on a variety of activities such as assessment of the patient, obtaining baseline data including height, weight, BMI, and grip strength, verifying medical history, allergies, and current medications, and teaching patient strategies for performing tasks with less respiratory effort with emphasis on pursed lip breathing.  Norris Cross MS, ACSM CEP 12:14 PM 03/15/2021

## 2021-03-18 NOTE — Progress Notes (Signed)
Pulmonary Rehab Orientation Physical Assessment Note  Physical assessment reveals  Pt is alert and oriented x 3.  Heart rate is normal, breath sounds bibasilar crackles characteristic of fibrosis . Reports productive cough at times.  Bowel sounds present.  Pt denies any abdominal discomfort, nausea, vomiting or diarrhea. Grip strength equal, strong. Distal pulses palpable ; no swelling to lower extremities. Sandra Curtis is looking forward to participating in pulmonary rehab. Alanson Aly, BSN Cardiac and Emergency planning/management officer

## 2021-03-23 ENCOUNTER — Encounter (HOSPITAL_COMMUNITY)
Admission: RE | Admit: 2021-03-23 | Discharge: 2021-03-23 | Disposition: A | Discharge status: Home / Self Care | Payer: BC Managed Care – PPO | Source: Ambulatory Visit | Attending: Pulmonary Disease | Admitting: Pulmonary Disease

## 2021-03-23 ENCOUNTER — Other Ambulatory Visit: Payer: Self-pay

## 2021-03-23 VITALS — Wt 141.1 lb

## 2021-03-23 DIAGNOSIS — J8410 Pulmonary fibrosis, unspecified: Secondary | ICD-10-CM | POA: Diagnosis not present

## 2021-03-23 NOTE — Progress Notes (Signed)
Daily Session Note  Patient Details  Name: Sandra Curtis MRN: [id number] Date of Birth: 1956-08-14 Referring Provider:   [name] Pulmonary Rehab Walk Test from 03/15/2021 in [place]  Referring Provider Dr. Vaughan Browner       Encounter Date: 03/23/2021  Check In:  Session Check In - 03/23/21 1334       Check-In   Supervising physician immediately available to respond to emergencies [facility] immediately available    Physician(s) Dr. Lonny Prude    Location [facility]    Staff Present [name], RN, BSN;[name], RN;Asianna Brundage Hassell Done, MS, ACSM-CEP, Exercise Physiologist    Virtual Visit No    Medication changes reported     No    Fall or balance concerns reported    No    Tobacco Cessation No Change    Warm-up and Cool-down Performed as group-led instruction    Resistance Training Performed Yes    VAD Patient? No    PAD/SET Patient? No      Pain Assessment   Currently in Pain? No/denies    Multiple Pain Sites No             Capillary Blood Glucose: No results found for this or any previous visit (from the past 24 hour(s)).   Exercise Prescription Changes - 03/23/21 1400       Response to Exercise   Blood Pressure (Admit) 122/58    Blood Pressure (Exercise) 116/60    Blood Pressure (Exit) 120/70    Heart Rate (Admit) 77 bpm    Heart Rate (Exercise) 103 bpm    Heart Rate (Exit) 88 bpm    Oxygen Saturation (Admit) 99 %    Oxygen Saturation (Exercise) 94 %    Oxygen Saturation (Exit) 97 %    Rating of Perceived Exertion (Exercise) 13    Perceived Dyspnea (Exercise) 2    Duration Progress to 30 minutes of  aerobic without signs/symptoms of physical distress    Intensity Other (comment)   40-80% HR max     Progression   Progression Continue to progress workloads to maintain intensity without signs/symptoms of physical distress.      Resistance Training   Training Prescription Yes    Weight Red  bands    Reps 10-15    Time 10 Minutes      NuStep   Level 2    SPM 80    Minutes 15    METs 2.1      Track   Laps 14    Minutes 15    METs 2.63             Social History   Tobacco Use  Smoking Status Former   Packs/day: 0.50   Years: 35.00   Pack years: 17.50   Types: Cigarettes   Quit date: 04/12/2018   Years since quitting: 2.9  Smokeless Tobacco Never    Goals Met:  Proper associated with RPD/PD & O2 Sat Exercise tolerated well No report of cardiac concerns or symptoms Strength training completed today  Goals Unmet:  Not Applicable  Comments: Service time is from 1315 to 1430 Patient completed first day of exercise and tolerated well with no concerns.     Dr. Fransico Him is Medical Director for Cardiac Rehab at [facility].

## 2021-03-25 ENCOUNTER — Other Ambulatory Visit: Payer: Self-pay

## 2021-03-25 ENCOUNTER — Encounter (HOSPITAL_COMMUNITY)
Admission: RE | Admit: 2021-03-25 | Discharge: 2021-03-25 | Disposition: A | Discharge status: Home / Self Care | Payer: BC Managed Care – PPO | Source: Ambulatory Visit | Attending: Pulmonary Disease | Admitting: Pulmonary Disease

## 2021-03-25 ENCOUNTER — Other Ambulatory Visit (INDEPENDENT_AMBULATORY_CARE_PROVIDER_SITE_OTHER): Payer: BC Managed Care – PPO

## 2021-03-25 DIAGNOSIS — J8410 Pulmonary fibrosis, unspecified: Secondary | ICD-10-CM

## 2021-03-25 DIAGNOSIS — Z5181 Encounter for therapeutic drug level monitoring: Secondary | ICD-10-CM | POA: Diagnosis not present

## 2021-03-25 LAB — HEPATIC FUNCTION PANEL
ALT: 16 U/L (ref 0–35)
AST: 23 U/L (ref 0–37)
Albumin: 4.4 g/dL (ref 3.5–5.2)
Alkaline Phosphatase: 50 U/L (ref 39–117)
Bilirubin, Direct: 0.1 mg/dL (ref 0.0–0.3)
Total Bilirubin: 0.3 mg/dL (ref 0.2–1.2)
Total Protein: 7.8 g/dL (ref 6.0–8.3)

## 2021-03-25 NOTE — Progress Notes (Signed)
Daily Session Note  Patient Details  Name: Sandra Curtis MRN: [id number] Date of Birth: Sep 15, 1956 Referring Provider:   [name] Pulmonary Rehab Walk Test from 03/15/2021 in [place]  Referring Provider Dr. Vaughan Browner       Encounter Date: 03/25/2021  Check In:  Session Check In - 03/25/21 1452       Check-In   Supervising physician immediately available to respond to emergencies [facility] immediately available    Physician(s) Dr. Arbutus Ped    Location [facility]    Staff Present [name], RN, BSN;Other;Mizuki Hoel Ysidro Evert, RN    Virtual Visit No    Medication changes reported     No    Fall or balance concerns reported    No    Tobacco Cessation No Change    Warm-up and Cool-down Performed as group-led instruction    Resistance Training Performed Yes    VAD Patient? No    PAD/SET Patient? No      Pain Assessment   Currently in Pain? No/denies    Multiple Pain Sites No             Capillary Blood Glucose: No results found for this or any previous visit (from the past 24 hour(s)).    Social History   Tobacco Use  Smoking Status Former   Packs/day: 0.50   Years: 35.00   Pack years: 17.50   Types: Cigarettes   Quit date: 04/12/2018   Years since quitting: 2.9  Smokeless Tobacco Never    Goals Met:  Exercise tolerated well No report of cardiac concerns or symptoms Strength training completed today  Goals Unmet:  Not Applicable  Comments: Service time is from 1315 to 1430    Dr. Fransico Him is Medical Director for Cardiac Rehab at [facility].

## 2021-03-26 NOTE — Telephone Encounter (Signed)
Sandra Greathouse, MD  02/22/2021  2:43 PM EDT      Send letter that labs are normal    Message sent to Patient through my chart letting her know liver function was normal.

## 2021-03-30 ENCOUNTER — Encounter (HOSPITAL_COMMUNITY)
Admission: RE | Admit: 2021-03-30 | Discharge: 2021-03-30 | Disposition: A | Discharge status: Home / Self Care | Payer: BC Managed Care – PPO | Source: Ambulatory Visit | Attending: Pulmonary Disease | Admitting: Pulmonary Disease

## 2021-03-30 ENCOUNTER — Other Ambulatory Visit: Payer: Self-pay

## 2021-03-30 DIAGNOSIS — J8410 Pulmonary fibrosis, unspecified: Secondary | ICD-10-CM | POA: Diagnosis not present

## 2021-03-30 NOTE — Progress Notes (Signed)
Pulmonary Individual Treatment Plan  Patient Details  Name: Sandra Curtis MRN: [id number] Date of Birth: [birth year] Referring Provider:   Doristine Devoid Pulmonary Rehab Walk Test from 03/15/2021 in [facility]  Referring Provider Dr. Isaiah Serge       Initial Encounter Date:  Flowsheet Row Pulmonary Rehab Walk Test from 03/15/2021 in [facility] CARDIAC REHAB  Date 03/15/21       Visit Diagnosis: Pulmonary fibrosis (HCC)  Patient's Home Medications on Admission:   Current Outpatient Medications:    acetaminophen (TYLENOL) 650 MG CR tablet, Take 650 mg by mouth every 8 (eight) hours as needed for pain., Disp: , Rfl:    albuterol (VENTOLIN HFA) 108 (90 Base) MCG/ACT inhaler, Inhale 1-2 puffs into the lungs every 6 (six) hours as needed., Disp: 6.7 g, Rfl: 0   atorvastatin (LIPITOR) 20 MG tablet, Take 1 tablet (20 mg total) by mouth at bedtime., Disp: 30 tablet, Rfl: 5   Cholecalciferol (VITAMIN D3) 25 MCG (1000 UT) CAPS, Take by mouth., Disp: , Rfl:    esomeprazole (NEXIUM) 40 MG capsule, Take 1 capsule (40 mg total) by mouth 2 (two) times daily before a meal. (Patient not taking: Reported on 03/15/2021), Disp: 60 capsule, Rfl: 11   famotidine (PEPCID) 40 MG tablet, Take 1 tablet (40 mg total) by mouth at bedtime., Disp: 30 tablet, Rfl: 11   gabapentin (NEURONTIN) 100 MG capsule, Take 3 capsules (300 mg total) by mouth at bedtime as needed., Disp: 90 capsule, Rfl: 5   HYDROcodone-homatropine (HYCODAN) 5-1.5 MG/5ML syrup, Take 5 mLs by mouth every 6 (six) hours as needed for cough., Disp: 240 mL, Rfl: 0   Nintedanib (OFEV) 100 MG CAPS, Take 1 capsule (100 mg total) by mouth 2 (two) times daily., Disp: 60 capsule, Rfl: 4   predniSONE (DELTASONE) 10 MG tablet, Take 10 mg by mouth daily with breakfast., Disp: , Rfl:    Spacer/Aero-Holding Chambers (AEROCHAMBER PLUS FLO-VU W/MASK) MISC, USE AS DIRECTED ONCE FOR 1 DOSE, Disp: 1 each, Rfl: 0    temazepam (RESTORIL) 7.5 MG capsule, Take 1 capsule (7.5 mg total) by mouth at bedtime as needed for sleep., Disp: 30 capsule, Rfl: 5  Past Medical History: Past Medical History:  Diagnosis Date   Allergic rhinitis    Allergy    Chronic back pain 07/20/2018   Chronic midline low back pain 02/06/2017   Depression    Esophageal stricture    GERD (gastroesophageal reflux disease)    Inflammatory polyps of colon (HCC) 2010   Pulmonary filariasis    Systolic murmur    UTI (urinary tract infection)     Tobacco Use: Social History   Tobacco Use  Smoking Status Former   Packs/day: 0.50   Years: 35.00   Pack years: 17.50   Types: Cigarettes   Quit date: 04/12/2018   Years since quitting: 2.9  Smokeless Tobacco Never    Labs: Recent Review Flowsheet Data     Labs for ITP Cardiac and Pulmonary Rehab Latest Ref Rng & Units 06/12/2018 12/12/2018 11/27/2019 05/26/2020 11/17/2020   Cholestrol 0 - 200 mg/dL - - 967 - 591   LDLCALC 0 - 99 mg/dL - - 638(G) - 94   HDL >66.59 mg/dL - - 93.57 - 01.77   Trlycerides 0.0 - 149.0 mg/dL - - 93.9 - 03.0   Hemoglobin A1c 4.6 - 6.5 % 6.4 5.9(A) 7.2(H) 6.5 6.6(H)       Capillary Blood Glucose: No results found  for: GLUCAP   Pulmonary Assessment Scores:  Pulmonary Assessment Scores     Row Name 03/15/21 1200         ADL UCSD   ADL Phase Entry     SOB Score total 82           CAT Score     CAT Score 30           mMRC Score     mMRC Score 4            UCSD: Self-administered rating of dyspnea associated with activities of daily living (ADLs) 6-point scale (0 = "not at all" to 5 = "maximal or unable to do because of breathlessness")  Scoring Scores range from 0 to 120.  Minimally important difference is 5 units  CAT: CAT can identify the health impairment of COPD patients and is better correlated with disease progression.  CAT has a scoring range of zero to 40. The CAT score is classified into four groups of low (less than 10),  medium (10 - 20), high (21-30) and very high (31-40) based on the impact level of disease on health status. A CAT score over 10 suggests significant symptoms.  A worsening CAT score could be explained by an exacerbation, poor medication adherence, poor inhaler technique, or progression of COPD or comorbid conditions.  CAT MCID is 2 points  mMRC: mMRC (Modified Medical Research Council) Dyspnea Scale is used to assess the degree of baseline functional disability in patients of respiratory disease due to dyspnea. No minimal important difference is established. A decrease in score of 1 point or greater is considered a positive change.   Pulmonary Function Assessment:  Pulmonary Function Assessment - 03/15/21 1032       Breath   Shortness of Breath Yes;Limiting activity             Exercise Target Goals: Exercise Program Goal: Individual exercise prescription set using results from initial 6 min walk test and THRR while considering  patient's activity barriers and safety.   Exercise Prescription Goal: Initial exercise prescription builds to 30-45 minutes a day of aerobic activity, 2-3 days per week.  Home exercise guidelines will be given to patient during program as part of exercise prescription that the participant will acknowledge.  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 HR 128 bpm  Max Ex. BP 152/72     2 Minute Post BP 142/66           Interval HR     1 Minute HR 100     2 Minute HR 117     3 Minute HR 107     4 Minute HR 114     5 Minute HR 121     6 Minute HR 128     2 Minute Post HR 84     Interval Heart Rate? Yes           Interval Oxygen     Interval Oxygen? Yes     Baseline Oxygen Saturation % 97 %     1 Minute Oxygen Saturation % 98 %     1 Minute Liters of Oxygen 0 L     2 Minute Oxygen Saturation % 92 %     2 Minute Liters of Oxygen 0 L     3 Minute Oxygen Saturation % 97 %     3 Minute Liters of Oxygen 0 L     4 Minute Oxygen Saturation % 97 %     4 Minute Liters of Oxygen 0 L     5 Minute Oxygen Saturation % 93 %     5 Minute Liters of Oxygen 0 L     6 Minute Oxygen Saturation % 93 %     6 Minute Liters of Oxygen 0 L     2 Minute Post Oxygen Saturation % 99 %     2 Minute Post Liters of Oxygen 0 L             Oxygen Initial Assessment:  Oxygen Initial Assessment - 03/15/21 1031       Home Oxygen   Home Oxygen Device None    Sleep Oxygen Prescription None    Home Exercise Oxygen Prescription None    Home Resting Oxygen Prescription None      Initial 6 min Walk   Oxygen Used None      Program Oxygen Prescription   Program Oxygen Prescription None      Intervention   Short Term Goals To learn and understand importance of monitoring SPO2 with pulse oximeter and demonstrate accurate use of the pulse oximeter.;To learn and understand importance of maintaining oxygen saturations>88%;To learn and demonstrate proper pursed lip breathing techniques or other breathing techniques. ;To learn and demonstrate proper use of respiratory medications    Long  Term Goals Verbalizes importance of monitoring SPO2 with pulse oximeter and return demonstration;Maintenance of O2 saturations>88%;Exhibits proper breathing techniques, such  as pursed lip breathing or other method taught during program session;Compliance with respiratory medication;Demonstrates proper use of MDI's             Oxygen Re-Evaluation:  Oxygen Re-Evaluation     Row Name 03/29/21 1112             Program Oxygen Prescription   Program Oxygen Prescription None               Home Oxygen     Home Oxygen Device None       Sleep Oxygen Prescription None       Home Exercise Oxygen Prescription None       Home Resting Oxygen Prescription None               Goals/Expected Outcomes     Short Term Goals To learn and understand importance of monitoring SPO2 with pulse  oximeter and demonstrate accurate use of the pulse oximeter.;To learn and understand importance of maintaining oxygen saturations>88%;To learn and demonstrate proper pursed lip breathing techniques or other breathing techniques. ;To learn and demonstrate proper use of respiratory medications       Long  Term Goals Verbalizes importance of monitoring SPO2 with pulse oximeter and return demonstration;Maintenance of O2 saturations>88%;Exhibits proper breathing techniques, such as pursed lip breathing or other method taught during program session;Compliance with respiratory medication;Demonstrates proper use of MDI's       Goals/Expected Outcomes compliance and understanding of monitoring oxygen saturation and performing pursed lip breathing when needed.               Oxygen Discharge (Final Oxygen Re-Evaluation):  Oxygen Re-Evaluation - 03/29/21 1112       Program Oxygen Prescription   Program Oxygen Prescription None      Home Oxygen   Home Oxygen Device None    Sleep Oxygen Prescription None    Home Exercise Oxygen Prescription None    Home Resting Oxygen Prescription None      Goals/Expected Outcomes   Short Term Goals To learn and understand importance of monitoring SPO2 with pulse oximeter and demonstrate accurate use of the pulse oximeter.;To learn and understand  importance of maintaining oxygen saturations>88%;To learn and demonstrate proper pursed lip breathing techniques or other breathing techniques. ;To learn and demonstrate proper use of respiratory medications    Long  Term Goals Verbalizes importance of monitoring SPO2 with pulse oximeter and return demonstration;Maintenance of O2 saturations>88%;Exhibits proper breathing techniques, such as pursed lip breathing or other method taught during program session;Compliance with respiratory medication;Demonstrates proper use of MDI's    Goals/Expected Outcomes compliance and understanding of monitoring oxygen saturation and performing pursed lip breathing when needed.             Initial Exercise Prescription:  Initial Exercise Prescription - 03/15/21 1200       Date of Initial Exercise RX and Referring Provider   Date 03/15/21    Referring Provider Dr. Isaiah Serge    Expected Discharge Date 05/20/21      NuStep   Level 1    SPM 80    Minutes 15      Track   Minutes 15      Prescription Details   Frequency (times per week) 2    Duration Progress to 30 minutes of continuous aerobic without signs/symptoms of physical distress      Intensity   THRR 40-80% of Max Heartrate 62-124    Ratings of Perceived Exertion 11-13    Perceived Dyspnea 0-4      Progression   Progression Continue to progress workloads to maintain intensity without signs/symptoms of physical distress.      Resistance Training   Training Prescription Yes    Weight Red bands    Reps 10-15             Perform Capillary Blood Glucose checks as needed.  Exercise Prescription Changes:   Exercise Prescription Changes     Row Name 03/23/21 1400             Response to Exercise   Blood Pressure (Admit) 122/58       Blood Pressure (Exercise) 116/60       Blood Pressure (Exit) 120/70       Heart Rate (Admit) 77 bpm       Heart Rate (Exercise) 103 bpm       Heart Rate (Exit) 88  bpm       Oxygen Saturation  (Admit) 99 %       Oxygen Saturation (Exercise) 94 %       Oxygen Saturation (Exit) 97 %       Rating of Perceived Exertion (Exercise) 13       Perceived Dyspnea (Exercise) 2       Duration Progress to 30 minutes of  aerobic without signs/symptoms of physical distress       Intensity Other (comment)  40-80% HR max               Progression     Progression Continue to progress workloads to maintain intensity without signs/symptoms of physical distress.               Resistance Training     Training Prescription Yes       Weight Red bands       Reps 10-15       Time 10 Minutes               NuStep     Level 2       SPM 80       Minutes 15       METs 2.1               Track     Laps 14       Minutes 15       METs 2.63               Exercise Comments:   Exercise Comments     Row Name 03/23/21 1449           Exercise Comments Pt completed first day of exercise and tolerated well with no concerns. She was able to do 15 minutes on the Nustep with no rest breaks. She also walked the track for 15 minutes and did have to take one seated rest break due to chronic leg pain. Other than that, she had no other complaints. She was also able to do resistance training exercises and stretches with no issues. Will continue to monitor and progress as able.                Exercise Goals and Review:   Exercise Goals     Row Name 03/15/21 1143             Exercise Goals   Increase Physical Activity Yes       Intervention Provide advice, education, support and counseling about physical activity/exercise needs.;Develop an individualized exercise prescription for aerobic and resistive training based on initial evaluation findings, risk stratification, comorbidities and participant's personal goals.       Expected Outcomes Short Term: Attend rehab on a regular basis to increase amount of physical activity.;Long Term: Add in home exercise to make exercise part of routine and to  increase amount of physical activity.;Long Term: Exercising regularly at least 3-5 days a week.       Increase Strength and Stamina Yes       Intervention Provide advice, education, support and counseling about physical activity/exercise needs.;Develop an individualized exercise prescription for aerobic and resistive training based on initial evaluation findings, risk stratification, comorbidities and participant's personal goals.       Expected Outcomes Short Term: Increase workloads from initial exercise prescription for resistance, speed, and METs.;Short Term: Perform resistance training exercises routinely during rehab and add in resistance training at home;Long Term: Improve cardiorespiratory fitness, muscular endurance  and strength as measured by increased METs and functional capacity ( )       Able to understand and use rate of perceived exertion (RPE) scale Yes       Intervention Provide education and explanation on how to use RPE scale       Expected Outcomes Short Term: Able to use RPE daily in rehab to express subjective intensity level;Long Term:  Able to use RPE to guide intensity level when exercising independently       Able to understand and use Dyspnea scale Yes       Intervention Provide education and explanation on how to use Dyspnea scale       Expected Outcomes Short Term: Able to use Dyspnea scale daily in rehab to express subjective sense of shortness of breath during exertion;Long Term: Able to use Dyspnea scale to guide intensity level when exercising independently       Knowledge and understanding of Target Heart Rate Range (THRR) Yes       Intervention Provide education and explanation of THRR including how the numbers were predicted and where they are located for reference       Expected Outcomes Short Term: Able to state/look up THRR;Long Term: Able to use THRR to govern intensity when exercising independently;Short Term: Able to use daily as guideline for intensity in  rehab       Understanding of Exercise Prescription Yes       Intervention Provide education, explanation, and written materials on patient's individual exercise prescription       Expected Outcomes Short Term: Able to explain program exercise prescription;Long Term: Able to explain home exercise prescription to exercise independently                Exercise Goals Re-Evaluation :  Exercise Goals Re-Evaluation     Row Name 03/29/21 1110             Exercise Goal Re-Evaluation   Exercise Goals Review Increase Physical Activity;Increase Strength and Stamina;Able to understand and use rate of perceived exertion (RPE) scale;Able to understand and use Dyspnea scale;Knowledge and understanding of Target Heart Rate Range (THRR);Understanding of Exercise Prescription       Comments Dondra Spry has completed 2 exercise sessions and has tolerated well so far with no concerns. It it too early to note any progression. She is exercising at 1.9 METS on the Nustep and 2.28 METS on the track. Will continue to monitor and progress as she is able.       Expected Outcomes Through exercise at rehab and home, the patient will decrease shortness of breath with daily activities and feel confident in carrying out an exercise regimn at home.                Discharge Exercise Prescription (Final Exercise Prescription Changes):  Exercise Prescription Changes - 03/23/21 1400       Response to Exercise   Blood Pressure (Admit) 122/58    Blood Pressure (Exercise) 116/60    Blood Pressure (Exit) 120/70    Heart Rate (Admit) 77 bpm    Heart Rate (Exercise) 103 bpm    Heart Rate (Exit) 88 bpm    Oxygen Saturation (Admit) 99 %    Oxygen Saturation (Exercise) 94 %    Oxygen Saturation (Exit) 97 %    Rating of Perceived Exertion (Exercise) 13    Perceived Dyspnea (Exercise) 2    Duration Progress to 30 minutes of  aerobic without signs/symptoms of physical distress  Intensity Other (comment)   40-80% HR max      Progression   Progression Continue to progress workloads to maintain intensity without signs/symptoms of physical distress.      Resistance Training   Training Prescription Yes    Weight Red bands    Reps 10-15    Time 10 Minutes      NuStep   Level 2    SPM 80    Minutes 15    METs 2.1      Track   Laps 14    Minutes 15    METs 2.63             Nutrition:  Target Goals: Understanding of nutrition guidelines, daily intake of sodium 1500mg , cholesterol 200mg , calories 30% from fat and 7% or less from saturated fats, daily to have 5 or more servings of fruits and vegetables.  Biometrics:  Pre Biometrics - 03/15/21 1144       Pre Biometrics   Grip Strength 27 kg              Nutrition Therapy Plan and Nutrition Goals:  Nutrition Therapy & Goals - 03/30/21 0855       Nutrition Therapy   RD appointment deferred Yes             Nutrition Assessments:  MEDIFICTS Score Key: ?70 Need to make dietary changes  40-70 Heart Healthy Diet ? 40 Therapeutic Level Cholesterol Diet   Picture Your Plate Scores: <67 Unhealthy dietary pattern with much room for improvement. 41-50 Dietary pattern unlikely to meet recommendations for good health and room for improvement. 51-60 More healthful dietary pattern, with some room for improvement.  >60 Healthy dietary pattern, although there may be some specific behaviors that could be improved.    Nutrition Goals Re-Evaluation:   Nutrition Goals Discharge (Final Nutrition Goals Re-Evaluation):   Psychosocial: Target Goals: Acknowledge presence or absence of significant depression and/or stress, maximize coping skills, provide positive support system. Participant is able to verbalize types and ability to use techniques and skills needed for reducing stress and depression.  Initial Review & Psychosocial Screening:  Initial Psych Review & Screening - 03/15/21 1032       Initial Review   Current issues with  Current Depression;Current Sleep Concerns      Family Dynamics   Good Support System? Yes   Family, daughter, and mom     Barriers   Psychosocial barriers to participate in program The patient should benefit from training in stress management and relaxation.      Screening Interventions   Interventions Encouraged to exercise    Expected Outcomes Long Term Goal: Stressors or current issues are controlled or eliminated.;Short Term goal: Identification and review with participant of any Quality of Life or Depression concerns found by scoring the questionnaire.;Long Term goal: The participant improves quality of Life and PHQ9 Scores as seen by post scores and/or verbalization of changes             Quality of Life Scores:  Scores of 19 and below usually indicate a poorer quality of life in these areas.  A difference of  2-3 points is a clinically meaningful difference.  A difference of 2-3 points in the total score of the Quality of Life Index has been associated with significant improvement in overall quality of life, self-image, physical symptoms, and general health in studies assessing change in quality of life.  PHQ-9: Recent Review Flowsheet Data  Depression screen [facility] 2/9 03/15/2021 01/12/2021 11/17/2020 05/26/2020 11/27/2019   Decreased Interest 3 0 3 0 0   Down, Depressed, Hopeless 1 0 1 0 0   PHQ - 2 Score 4 0 4 0 0   Altered sleeping - -   Tired, decreased energy 1 0 3 - -   Change in appetite - -   Feeling bad or failure about yourself  0 0 0 - -   Trouble concentrating 0 0 0 - -   Moving slowly or fidgety/restless 0 0 3 - -   Suicidal thoughts 0 0 0 - -   PHQ-9 Score - -   Difficult doing work/chores Somewhat difficult Not difficult at all Somewhat difficult - -      Interpretation of Total Score  Total Score Depression Severity:  1-4 = Minimal depression, 5-9 = Mild depression, 10-14 = Moderate depression, 15-19 = Moderately severe depression, 20-27 =  Severe depression   Psychosocial Evaluation and Intervention:  Psychosocial Evaluation - 03/15/21 1145       Psychosocial Evaluation & Interventions   Interventions Encouraged to exercise with the program and follow exercise prescription    Comments Patient does admit to having depression and states it is due to her health an inability to do things. She was diagnosed the pulmonary fibrosis in December of 2021, so it is fairly recent and she is learning to deal with it. She states she cried for weeks after finding out her diagnosis. She currently does take medication for depression and has been referred to a counselor by her doctor.    Expected Outcomes For pt to learn healthy ways to cope with stressors and to manage depression symptoms in healthy ways. Also to improve pt's quality of life by decreasing shortness of breath in order for pt to be able to do more things she enjoys.    Continue Psychosocial Services  Follow up required by staff             Psychosocial Re-Evaluation:  Psychosocial Re-Evaluation     Row Name 03/29/21 1000             Psychosocial Re-Evaluation   Current issues with Current Sleep Concerns;Current Depression       Expected Outcomes Current depression is from her chronic illness of pulmonary fibrosis.  She sees a Veterinary surgeon, but does not want to be on anti-depressant medications.  She takes restoril at night to help with insomnia.  She is stable at this time.       Interventions Encouraged to attend Pulmonary Rehabilitation for the exercise;Relaxation education;Stress management education       Continue Psychosocial Services  No Follow up required                Psychosocial Discharge (Final Psychosocial Re-Evaluation):  Psychosocial Re-Evaluation - 03/29/21 1000       Psychosocial Re-Evaluation   Current issues with Current Sleep Concerns;Current Depression    Expected Outcomes Current depression is from her chronic illness of pulmonary fibrosis.   She sees a Veterinary surgeon, but does not want to be on anti-depressant medications.  She takes restoril at night to help with insomnia.  She is stable at this time.    Interventions Encouraged to attend Pulmonary Rehabilitation for the exercise;Relaxation education;Stress management education    Continue Psychosocial Services  No Follow up required             Education: Education  Goals: Education classes will be provided on a weekly basis, covering required topics. Participant will state understanding/return demonstration of topics presented.  Learning Barriers/Preferences:  Learning Barriers/Preferences - 03/15/21 1033       Learning Barriers/Preferences   Learning Barriers None    Learning Preferences Individual Instruction;Skilled Demonstration             Education Topics: Risk Factor Reduction:  -Group instruction that is supported by a PowerPoint presentation. Instructor discusses the definition of a risk factor, different risk factors for pulmonary disease, and how the heart and lungs work together.     Nutrition for Pulmonary Patient:  -Group instruction provided by PowerPoint slides, verbal discussion, and written materials to support subject matter. The instructor gives an explanation and review of healthy diet recommendations, which includes a discussion on weight management, recommendations for fruit and vegetable consumption, as well as protein, fluid, caffeine, fiber, sodium, sugar, and alcohol. Tips for eating when patients are short of breath are discussed. Flowsheet Row PULMONARY REHAB OTHER RESPIRATORY from 03/25/2021 in [facility] CARDIAC REHAB  Date 03/25/21  Educator Handout       Pursed Lip Breathing:  -Group instruction that is supported by demonstration and informational handouts. Instructor discusses the benefits of pursed lip and diaphragmatic breathing and detailed demonstration on how to preform both.     Oxygen Safety:  -Group  instruction provided by PowerPoint, verbal discussion, and written material to support subject matter. There is an overview of "What is Oxygen" and "Why do we need it".  Instructor also reviews how to create a safe environment for oxygen use, the importance of using oxygen as prescribed, and the risks of noncompliance. There is a brief discussion on traveling with oxygen and resources the patient may utilize.   Oxygen Equipment:  -Group instruction provided by [facility] Staff utilizing handouts, written materials, and equipment demonstrations.   Signs and Symptoms:  -Group instruction provided by written material and verbal discussion to support subject matter. Warning signs and symptoms of infection, stroke, and heart attack are reviewed and when to call the physician/911 reinforced. Tips for preventing the spread of infection discussed.   Advanced Directives:  -Group instruction provided by verbal instruction and written material to support subject matter. Instructor reviews Advanced Directive laws and proper instruction for filling out document.   Pulmonary Video:  -Group video education that reviews the importance of medication and oxygen compliance, exercise, good nutrition, pulmonary hygiene, and pursed lip and diaphragmatic breathing for the pulmonary patient.   Exercise for the Pulmonary Patient:  -Group instruction that is supported by a PowerPoint presentation. Instructor discusses benefits of exercise, core components of exercise, frequency, duration, and intensity of an exercise routine, importance of utilizing pulse oximetry during exercise, safety while exercising, and options of places to exercise outside of rehab.     Pulmonary Medications:  -Verbally interactive group education provided by instructor with focus on inhaled medications and proper administration.   Anatomy and Physiology of the Respiratory System and Intimacy:  -Group instruction provided by PowerPoint,  verbal discussion, and written material to support subject matter. Instructor reviews respiratory cycle and anatomical components of the respiratory system and their functions. Instructor also reviews differences in obstructive and restrictive respiratory diseases with examples of each. Intimacy, Sex, and Sexuality differences are reviewed with a discussion on how relationships can change when diagnosed with pulmonary disease. Common sexual concerns are reviewed.   MD DAY -A group question and answer session with a medical  doctor that allows participants to ask questions that relate to their pulmonary disease state.   OTHER EDUCATION -Group or individual verbal, written, or video instructions that support the educational goals of the pulmonary rehab program.   Holiday Eating Survival Tips:  -Group instruction provided by PowerPoint slides, verbal discussion, and written materials to support subject matter. The instructor gives patients tips, tricks, and techniques to help them not only survive but enjoy the holidays despite the onslaught of food that accompanies the holidays.   Knowledge Questionnaire Score:  Knowledge Questionnaire Score - 03/15/21 1158       Knowledge Questionnaire Score   Pre Score 15/18             Core Components/Risk Factors/Patient Goals at Admission:  Personal Goals and Risk Factors at Admission - 03/15/21 1150       Core Components/Risk Factors/Patient Goals on Admission    Weight Management Weight Loss    Improve shortness of breath with ADL's Yes    Intervention Provide education, individualized exercise plan and daily activity instruction to help decrease symptoms of SOB with activities of daily living.    Expected Outcomes Short Term: Improve cardiorespiratory fitness to achieve a reduction of symptoms when performing ADLs;Long Term: Be able to perform more ADLs without symptoms or delay the onset of symptoms    Lipids Yes    Intervention Provide  education and support for participant on nutrition & aerobic/resistive exercise along with prescribed medications to achieve LDL 70mg , HDL >40mg .    Expected Outcomes Short Term: Participant states understanding of desired cholesterol values and is compliant with medications prescribed. Participant is following exercise prescription and nutrition guidelines.;Long Term: Cholesterol controlled with medications as prescribed, with individualized exercise RX and with personalized nutrition plan. Value goals: LDL < 70mg , HDL > 40 mg.             Core Components/Risk Factors/Patient Goals Review:   Goals and Risk Factor Review     Row Name 03/29/21 1004             Core Components/Risk Factors/Patient Goals Review   Personal Goals Review Develop more efficient breathing techniques such as purse lipped breathing and diaphragmatic breathing and practicing self-pacing with activity.;Increase knowledge of respiratory medications and ability to use respiratory devices properly.;Improve shortness of breath with ADL's       Review Dondra Spry just started pulmonary rehab, has attended 2 exercise sessions, too early to see progression towards goals at this time.       Expected Outcomes See admission goals.                Core Components/Risk Factors/Patient Goals at Discharge (Final Review):   Goals and Risk Factor Review - 03/29/21 1004       Core Components/Risk Factors/Patient Goals Review   Personal Goals Review Develop more efficient breathing techniques such as purse lipped breathing and diaphragmatic breathing and practicing self-pacing with activity.;Increase knowledge of respiratory medications and ability to use respiratory devices properly.;Improve shortness of breath with ADL's    Review Dondra Spry just started pulmonary rehab, has attended 2 exercise sessions, too early to see progression towards goals at this time.    Expected Outcomes See admission goals.             ITP  Comments:   Comments: ITP REVIEW Pt is making expected progress toward pulmonary rehab goals after completing 2 sessions. Recommend continued exercise, life style modification, education, and utilization of breathing techniques to increase stamina  and strength and decrease shortness of breath with exertion.

## 2021-03-30 NOTE — Progress Notes (Signed)
Daily Session Note  Patient Details  Name: Sandra Curtis MRN: [id number] Date of Birth: 03/15/1956 Referring Provider:   [name] Pulmonary Rehab Walk Test from 03/15/2021 in [place]  Referring Provider Dr. Vaughan Browner       Encounter Date: 03/30/2021  Check In:  Session Check In - 03/30/21 1428       Check-In   Supervising physician immediately available to respond to emergencies [facility] immediately available    Physician(s) Dr. Arbutus Ped    Location [facility]    Staff Present [name], RN, BSN;Murel Shenberger Ysidro Evert, RN;Jessica Hassell Done, MS, ACSM-CEP, Exercise Physiologist    Virtual Visit No    Medication changes reported     No    Fall or balance concerns reported    No    Tobacco Cessation No Change    Warm-up and Cool-down Performed as group-led instruction    Resistance Training Performed Yes    VAD Patient? No    PAD/SET Patient? No      Pain Assessment   Currently in Pain? No/denies    Pain Score 0-No pain    Multiple Pain Sites No             Capillary Blood Glucose: No results found for this or any previous visit (from the past 24 hour(s)).    Social History   Tobacco Use  Smoking Status Former   Packs/day: 0.50   Years: 35.00   Pack years: 17.50   Types: Cigarettes   Quit date: 04/12/2018   Years since quitting: 2.9  Smokeless Tobacco Never    Goals Met:  Exercise tolerated well No report of cardiac concerns or symptoms Strength training completed today  Goals Unmet:  Not Applicable  Comments: Service time is from 1315 to 1445    Dr. Fransico Him is Medical Director for Cardiac Rehab at [facility].

## 2021-04-01 ENCOUNTER — Telehealth: Payer: Self-pay | Admitting: Gastroenterology

## 2021-04-01 ENCOUNTER — Other Ambulatory Visit: Payer: Self-pay

## 2021-04-01 ENCOUNTER — Encounter (HOSPITAL_COMMUNITY)
Admission: RE | Admit: 2021-04-01 | Discharge: 2021-04-01 | Disposition: A | Discharge status: Home / Self Care | Payer: BC Managed Care – PPO | Source: Ambulatory Visit | Attending: Pulmonary Disease | Admitting: Pulmonary Disease

## 2021-04-01 DIAGNOSIS — J8410 Pulmonary fibrosis, unspecified: Secondary | ICD-10-CM | POA: Diagnosis not present

## 2021-04-01 NOTE — Telephone Encounter (Signed)
Inbound call from patient stating Nexium is over $300 and would like it to be switched to omeprazole if possible please.

## 2021-04-01 NOTE — Progress Notes (Signed)
Daily Session Note  Patient Details  Name: Sandra Curtis MRN: [id number] Date of Birth: 1956-08-25 Referring Provider:   [name] Pulmonary Rehab Walk Test from 03/15/2021 in [place]  Referring Provider Dr. Vaughan Browner       Encounter Date: 04/01/2021  Check In:  Session Check In - 04/01/21 1421       Check-In   Supervising physician immediately available to respond to emergencies [facility] immediately available    Physician(s) Dr. Lonny Prude    [facility]    Staff Present Rodney Langton, RN;Blasa Raisch Leonia Reeves, RN, Isaac Laud, MS, ACSM-CEP, Exercise Physiologist    Virtual Visit No    Medication changes reported     No    Fall or balance concerns reported    No    Tobacco Cessation No Change    Warm-up and Cool-down Performed as group-led instruction    Resistance Training Performed Yes    VAD Patient? No    PAD/SET Patient? No      Pain Assessment   Currently in Pain? No/denies    Multiple Pain Sites No             Capillary Blood Glucose: No results found for this or any previous visit (from the past 24 hour(s)).    Social History   Tobacco Use  Smoking Status Former   Packs/day: 0.50   Years: 35.00   Pack years: 17.50   Types: Cigarettes   Quit date: 04/12/2018   Years since quitting: 2.9  Smokeless Tobacco Never    Goals Met:  Proper associated with RPD/PD & O2 Sat Exercise tolerated well Strength training completed today  Goals Unmet:  Not Applicable  Comments: Service time is from 1315 to 1430    Dr. Fransico Him is Medical Director for Cardiac Rehab at [facility].

## 2021-04-01 NOTE — Telephone Encounter (Signed)
Left message for patient to return my call.

## 2021-04-05 MED ORDER — OMEPRAZOLE 40 MG PO CPDR: 40.0000 mg | DELAYED_RELEASE_CAPSULE | Freq: Every day | ORAL | 5 refills | Status: DC

## 2021-04-05 NOTE — Telephone Encounter (Signed)
Patient states she was never able to get the Nexium because her insurance would not cover the medication. Patient states her insurance will cover omeprazole and she thinks she remembers it working well for her. Prescription for omeprazole sent to patient's pharmacy.

## 2021-04-05 NOTE — Telephone Encounter (Signed)
Patient returned call. Best contact [phone]

## 2021-04-06 ENCOUNTER — Other Ambulatory Visit: Payer: Self-pay

## 2021-04-06 ENCOUNTER — Telehealth: Payer: Self-pay | Admitting: Pulmonary Disease

## 2021-04-06 ENCOUNTER — Encounter (HOSPITAL_COMMUNITY)
Admission: RE | Admit: 2021-04-06 | Discharge: 2021-04-06 | Disposition: A | Discharge status: Home / Self Care | Payer: BC Managed Care – PPO | Source: Ambulatory Visit | Attending: Pulmonary Disease | Admitting: Pulmonary Disease

## 2021-04-06 VITALS — Wt 142.4 lb

## 2021-04-06 DIAGNOSIS — J8410 Pulmonary fibrosis, unspecified: Secondary | ICD-10-CM

## 2021-04-06 NOTE — Telephone Encounter (Signed)
Sandra Curtis could you please advise?

## 2021-04-06 NOTE — Progress Notes (Signed)
Sandra Curtis 65 y.o. female Nutrition Note  Diagnosis: Pulmonary fibrosis  Past Medical History:  Diagnosis Date   Allergic rhinitis    Allergy    Chronic back pain 07/20/2018   Chronic midline low back pain 02/06/2017   Depression    Esophageal stricture    GERD (gastroesophageal reflux disease)    Inflammatory polyps of colon (HCC) 2010   Pulmonary filariasis    Systolic murmur    UTI (urinary tract infection)      Medications reviewed.   Current Outpatient Medications:    acetaminophen (TYLENOL) 650 MG CR tablet, Take 650 mg by mouth every 8 (eight) hours as needed for pain., Disp: , Rfl:    albuterol (VENTOLIN HFA) 108 (90 Base) MCG/ACT inhaler, Inhale 1-2 puffs into the lungs every 6 (six) hours as needed., Disp: 6.7 g, Rfl: 0   atorvastatin (LIPITOR) 20 MG tablet, Take 1 tablet (20 mg total) by mouth at bedtime., Disp: 30 tablet, Rfl: 5   Cholecalciferol (VITAMIN D3) 25 MCG (1000 UT) CAPS, Take by mouth., Disp: , Rfl:    famotidine (PEPCID) 40 MG tablet, Take 1 tablet (40 mg total) by mouth at bedtime., Disp: 30 tablet, Rfl: 11   gabapentin (NEURONTIN) 100 MG capsule, Take 3 capsules (300 mg total) by mouth at bedtime as needed. (Patient not taking: Reported on 03/30/2021), Disp: 90 capsule, Rfl: 5   HYDROcodone-homatropine (HYCODAN) 5-1.5 MG/5ML syrup, Take 5 mLs by mouth every 6 (six) hours as needed for cough., Disp: 240 mL, Rfl: 0   Nintedanib (OFEV) 100 MG CAPS, Take 1 capsule (100 mg total) by mouth 2 (two) times daily., Disp: 60 capsule, Rfl: 4   omeprazole (PRILOSEC) 40 MG capsule, Take 1 capsule (40 mg total) by mouth daily., Disp: 30 capsule, Rfl: 5   predniSONE (DELTASONE) 10 MG tablet, Take 10 mg by mouth daily with breakfast., Disp: , Rfl:    Spacer/Aero-Holding Chambers (AEROCHAMBER PLUS FLO-VU W/MASK) MISC, USE AS DIRECTED ONCE FOR 1 DOSE, Disp: 1 each, Rfl: 0   temazepam (RESTORIL) 7.5 MG capsule, Take 1 capsule (7.5 mg total) by mouth at bedtime as  needed for sleep., Disp: 30 capsule, Rfl: 5   traZODone (DESYREL) 50 MG tablet, Take 50 mg by mouth at bedtime. Patient takes 1/2 tablet at bedtime, Disp: , Rfl:    Ht Readings from Last 1 Encounters:  03/15/21 5' 0.5" (1.537 m)     Wt Readings from Last 3 Encounters:  03/23/21 141 lb 1.5 oz (64 kg)  03/15/21 140 lb 10.5 oz (63.8 kg)  03/01/21 140 lb 4 oz (63.6 kg)     There is no height or weight on file to calculate BMI.   Social History   Tobacco Use  Smoking Status Former   Packs/day: 0.50   Years: 35.00   Pack years: 17.50   Types: Cigarettes   Quit date: 04/12/2018   Years since quitting: 2.9  Smokeless Tobacco Never      Nutrition Note  Spoke with pt. Pt has Pre-diabetes. Last A1c indicates blood glucose well-controlled. We reviewed prediabetes as a risk factor for diabetes. Reviewed exercise, healthy diet, and A1C monitoring as ways to reduce risk of diabetes progression.  Pt is not taking lipitor every night. She estimates taking it 3 times per week.   Acid reflux: yes. Controlled with diet modifications/medications Appetite:normal Unintentional weight loss: none She avoids lactose products. She still uses lactaid milk for cereal. No GI discomfort. No GI issues with OFEV. She  always eats when she takes OFEV. Diet recall: Breakfast: nabs and yogurt OR instant oatmeal OR honey nut cheerios with lactaid milk Dinner: grilled chicken OR fish OR hot dog. Sometimes a potato or vegetable.  Meals per day: 1-2   Pt expressed understanding of the information reviewed.    Nutrition Diagnosis Food-and nutrition-related knowledge deficit related to lack of exposure to information as related to diagnosis of: ? HTN, HLD, pulmonary fibrosis  Nutrition Intervention Pt's individual nutrition plan reviewed with pt. Benefits of adopting healthy diet reviewed with Rate My Plate survey   Continue client-centered nutrition education by RD, as part of interdisciplinary  care.  Goal(s) Pt to build a healthy plate including vegetables, fruits, whole grains, and low-fat dairy products in a heart healthy meal plan. Pt to reduce refined carbohydrates   Plan:  Will provide client-centered nutrition education as part of interdisciplinary care Monitor and evaluate progress toward nutrition goal with team.   Andrey Campanile, MS, RDN, LDN

## 2021-04-06 NOTE — Progress Notes (Signed)
Daily Session Note  Patient Details  Name: Sandra Curtis MRN: [id number] Date of Birth: 03-15-1956 Referring Provider:   [name] Pulmonary Rehab Walk Test from 03/15/2021 in [place]  Referring Provider Dr. Vaughan Browner       Encounter Date: 04/06/2021  Check In:  Session Check In - 04/06/21 1411       Check-In   Supervising physician immediately available to respond to emergencies [facility] immediately available    Physician(s) Dr. Lonny Prude    [facility]    Staff Present Rodney Langton, RN;Olinty Celesta Aver, MS, ACSM CEP, Exercise Physiologist;Annedrea Rosezella [place], RN, MHA;Jessica Hassell Done, MS, ACSM-CEP, Exercise Physiologist;Other    Virtual Visit No    Medication changes reported     No    Fall or balance concerns reported    No    Tobacco Cessation No Change    Warm-up and Cool-down Performed as group-led instruction    Resistance Training Performed Yes    VAD Patient? No    PAD/SET Patient? No      Pain Assessment   Currently in Pain? No/denies    Pain Score 0-No pain    Multiple Pain Sites No             Capillary Blood Glucose: No results found for this or any previous visit (from the past 24 hour(s)).   Exercise Prescription Changes - 04/06/21 1500       Response to Exercise   Blood Pressure (Admit) 122/74    Blood Pressure (Exercise) 124/68    Blood Pressure (Exit) 120/60    Heart Rate (Admit) 90 bpm    Heart Rate (Exercise) 88 bpm    Heart Rate (Exit) 88 bpm    Oxygen Saturation (Admit) 99 %    Oxygen Saturation (Exercise) 97 %    Oxygen Saturation (Exit) 98 %    Rating of Perceived Exertion (Exercise) 10    Perceived Dyspnea (Exercise) 0    Duration Continue with 30 min of aerobic exercise without signs/symptoms of physical distress.    Intensity THRR unchanged      Progression   Progression Continue to progress workloads to maintain intensity without signs/symptoms of physical  distress.      Resistance Training   Training Prescription Yes    Weight Red bands    Reps 10-15    Time 10 Minutes      NuStep   Level 2    SPM 80    Minutes 30    METs 1.7             Social History   Tobacco Use  Smoking Status Former   Packs/day: 0.50   Years: 35.00   Pack years: 17.50   Types: Cigarettes   Quit date: 04/12/2018   Years since quitting: 2.9  Smokeless Tobacco Never    Goals Met:  Proper associated with RPD/PD & O2 Sat Exercise tolerated well No report of cardiac concerns or symptoms Strength training completed today  Goals Unmet:  Not Applicable  Comments: Service time is from 1315 to 1430    Dr. Fransico Him is Medical Director for Cardiac Rehab at [facility].

## 2021-04-07 NOTE — Telephone Encounter (Signed)
Nothing has been received to date.-pr

## 2021-04-07 NOTE — Telephone Encounter (Signed)
Called and spoke with pt and she stated that she spoke with [employer] this morning and they will not accept ILD as a disability dx.  She stated that she will just have to get a lawyer for this.  Nothing further is needed.

## 2021-04-08 ENCOUNTER — Encounter (HOSPITAL_COMMUNITY)
Admission: RE | Admit: 2021-04-08 | Discharge: 2021-04-08 | Disposition: A | Discharge status: Home / Self Care | Payer: BC Managed Care – PPO | Source: Ambulatory Visit | Attending: Pulmonary Disease | Admitting: Pulmonary Disease

## 2021-04-08 ENCOUNTER — Other Ambulatory Visit: Payer: Self-pay

## 2021-04-08 DIAGNOSIS — J8410 Pulmonary fibrosis, unspecified: Secondary | ICD-10-CM | POA: Diagnosis not present

## 2021-04-08 NOTE — Progress Notes (Signed)
Daily Session Note  Patient Details  Name: Sandra Curtis MRN: [id number] Date of Birth: 03/02/1956 Referring Provider:   [name] Pulmonary Rehab Walk Test from 03/15/2021 in [place]  Referring Provider Dr. Vaughan Browner       Encounter Date: 04/08/2021  Check In:  Session Check In - 04/08/21 [name]       Check-In   Supervising physician immediately available to respond to emergencies [facility] immediately available    Physician(s) Dr. Alfredia Ferguson    [facility]    Staff Present [name], RN, [name], MS, ACSM-CEP, Exercise Physiologist;[name], RN    Virtual Visit No    Medication changes reported     No    Fall or balance concerns reported    No    Tobacco Cessation No Change    Warm-up and Cool-down Performed as group-led instruction    Resistance Training Performed Yes    VAD Patient? No    PAD/SET Patient? No      Pain Assessment   Currently in Pain? No/denies    Multiple Pain Sites No             Capillary Blood Glucose: No results found for this or any previous visit (from the past 24 hour(s)).    Social History   Tobacco Use  Smoking Status Former   Packs/day: 0.50   Years: 35.00   Pack years: 17.50   Types: Cigarettes   Quit date: 04/12/2018   Years since quitting: 2.9  Smokeless Tobacco Never    Goals Met:  Proper associated with RPD/PD & O2 Sat Exercise tolerated well Strength training completed today  Goals Unmet:  Not Applicable  Comments: Service time is from 1315 to 1430    Dr. Fransico Him is Medical Director for Cardiac Rehab at [facility].

## 2021-04-13 ENCOUNTER — Other Ambulatory Visit: Payer: Self-pay

## 2021-04-13 ENCOUNTER — Encounter (HOSPITAL_COMMUNITY)
Admission: RE | Admit: 2021-04-13 | Discharge: 2021-04-13 | Disposition: A | Discharge status: Home / Self Care | Payer: BC Managed Care – PPO | Source: Ambulatory Visit | Attending: Pulmonary Disease | Admitting: Pulmonary Disease

## 2021-04-13 DIAGNOSIS — J8410 Pulmonary fibrosis, unspecified: Secondary | ICD-10-CM | POA: Diagnosis present

## 2021-04-13 NOTE — Progress Notes (Signed)
I have reviewed a Home Exercise Prescription with Sandra Curtis.  Sandra Curtis is currently exercising at home.  Pt states she does the resistance band exercises and stretches nearly day. She does have a treadmill at home but states it is stored away and she would have to dig it out. I did encourage her to get treadmill out so she can get some cardiovascular exercise on her days outside of rehab. Pt was receptive. The patient was advised to walk and perform resistance band exercises 2-3 days a week for 30-45 minutes.  Sandra Curtis and I discussed how to progress their exercise prescription.  The patient stated that their goals were to lose weight and to be able to do more activities with less shortness of breath.  The patient stated that they understand the exercise prescription.  We reviewed exercise guidelines, target heart rate during exercise, RPE Scale, weather conditions, use of rescue inhaler, endpoints for exercise, warmup and cool down.  Patient is encouraged to come to me with any questions. I will continue to follow up with the patient to assist them with progression and safety.    Norris Cross MS, ACSM CEP

## 2021-04-15 ENCOUNTER — Encounter (HOSPITAL_COMMUNITY)
Admission: RE | Admit: 2021-04-15 | Discharge: 2021-04-15 | Disposition: A | Discharge status: Home / Self Care | Payer: BC Managed Care – PPO | Source: Ambulatory Visit | Attending: Pulmonary Disease | Admitting: Pulmonary Disease

## 2021-04-15 ENCOUNTER — Other Ambulatory Visit: Payer: Self-pay

## 2021-04-15 DIAGNOSIS — J8410 Pulmonary fibrosis, unspecified: Secondary | ICD-10-CM

## 2021-04-15 NOTE — Progress Notes (Signed)
Daily Session Note  Patient Details  Name: Yaritsa Savarino MRN: [id number] Date of Birth: 12-13-1955 Referring Provider:   [name] Pulmonary Rehab Walk Test from 03/15/2021 in [place]  Referring Provider Dr. Vaughan Browner       Encounter Date: 04/15/2021  Check In:  Session Check In - 04/15/21 1458       Check-In   Supervising physician immediately available to respond to emergencies [facility] immediately available    Physician(s) Dr. Cruzita Lederer    [facility]    Staff Present [name], RN, Isaac Laud, MS, ACSM-CEP, Exercise Physiologist;Lisa Colletta [place], RN, MHA    Virtual Visit No    Medication changes reported     No    Fall or balance concerns reported    No    Tobacco Cessation No Change    Warm-up and Cool-down Performed as group-led instruction    Resistance Training Performed Yes    VAD Patient? No    PAD/SET Patient? No      Pain Assessment   Currently in Pain? No/denies    Multiple Pain Sites No             Capillary Blood Glucose: No results found for this or any previous visit (from the past 24 hour(s)).    Social History   Tobacco Use  Smoking Status Former   Packs/day: 0.50   Years: 35.00   Pack years: 17.50   Types: Cigarettes   Quit date: 04/12/2018   Years since quitting: 3.0  Smokeless Tobacco Never    Goals Met:  Proper associated with RPD/PD & O2 Sat Exercise tolerated well No report of cardiac concerns or symptoms Strength training completed today  Goals Unmet:  Not Applicable  Comments: Service time is from 1315 to 73    Dr. Fransico Him is Medical Director for Cardiac Rehab at [facility].

## 2021-04-16 ENCOUNTER — Other Ambulatory Visit: Payer: Self-pay | Admitting: Nurse Practitioner

## 2021-04-16 ENCOUNTER — Telehealth: Payer: Self-pay | Admitting: Pulmonary Disease

## 2021-04-16 DIAGNOSIS — I700 Atherosclerosis of aorta: Secondary | ICD-10-CM

## 2021-04-16 NOTE — Telephone Encounter (Signed)
Spoke with Pharmacy, patient has 3 refills still available, this request was placed in error.

## 2021-04-16 NOTE — Telephone Encounter (Signed)
Noted.   Will route message to PM nurse to f/u.

## 2021-04-20 ENCOUNTER — Other Ambulatory Visit: Payer: Self-pay

## 2021-04-20 ENCOUNTER — Encounter (HOSPITAL_COMMUNITY)
Admission: RE | Admit: 2021-04-20 | Discharge: 2021-04-20 | Disposition: A | Discharge status: Home / Self Care | Payer: BC Managed Care – PPO | Source: Ambulatory Visit | Attending: Pulmonary Disease | Admitting: Pulmonary Disease

## 2021-04-20 VITALS — Wt 145.3 lb

## 2021-04-20 DIAGNOSIS — J8410 Pulmonary fibrosis, unspecified: Secondary | ICD-10-CM

## 2021-04-20 NOTE — Telephone Encounter (Signed)
Paperwork was received from Dr. Shirlee More A pod box. I hand delivered papers to Darilyn, who is office disability contact for Patrice.

## 2021-04-20 NOTE — Progress Notes (Signed)
Daily Session Note  Patient Details  Name: Gennell How MRN: [id number] Date of Birth: [birth year] Referring Provider:   [name] Pulmonary Rehab Walk Test from 03/15/2021 in [place]  Referring Provider Dr. Vaughan Browner       Encounter Date: 04/20/2021  Check In:  Session Check In - 04/20/21 1431       Check-In   Supervising physician immediately available to respond to emergencies [facility] immediately available    Physician(s) Dr. Cruzita Lederer    Location [facility]    Staff Present [name], RN, BSN;[name], RN;David [place], MS, ACSM-CEP, CCRP, Exercise Physiologist;Jessica Hassell Done, MS, ACSM-CEP, Exercise Physiologist    Virtual Visit No    Medication changes reported     No    Fall or balance concerns reported    No    Tobacco Cessation No Change    Warm-up and Cool-down Performed as group-led instruction    Resistance Training Performed Yes    VAD Patient? No    PAD/SET Patient? No      Pain Assessment   Currently in Pain? No/denies    Multiple Pain Sites No             Capillary Blood Glucose: No results found for this or any previous visit (from the past 24 hour(s)).   Exercise Prescription Changes - 04/20/21 1500       Response to Exercise   Blood Pressure (Admit) 100/60    Blood Pressure (Exercise) 100/60    Blood Pressure (Exit) 102/60    Heart Rate (Admit) 85 bpm    Heart Rate (Exercise) 108 bpm    Heart Rate (Exit) 73 bpm    Oxygen Saturation (Admit) 97 %    Oxygen Saturation (Exercise) 96 %    Oxygen Saturation (Exit) 99 %    Rating of Perceived Exertion (Exercise) 11    Perceived Dyspnea (Exercise) 1    Duration Continue with 30 min of aerobic exercise without signs/symptoms of physical distress.    Intensity THRR unchanged      Progression   Progression Continue to progress workloads to maintain intensity without signs/symptoms of physical distress.      Resistance  Training   Training Prescription Yes    Weight Red Bands    Reps 10-15    Time 10 Minutes      Treadmill   MPH 1.9    Grade 0    Minutes 15      NuStep   Level 4    SPM 80    Minutes 15    METs 1.8             Social History   Tobacco Use  Smoking Status Former   Packs/day: 0.50   Years: 35.00   Pack years: 17.50   Types: Cigarettes   Quit date: 04/12/2018   Years since quitting: 3.0  Smokeless Tobacco Never    Goals Met:  Proper associated with RPD/PD & O2 Sat Exercise tolerated well No report of cardiac concerns or symptoms Strength training completed today  Goals Unmet:  Not Applicable  Comments: Service time is from 1327 to 1430    Dr. Fransico Him is Medical Director for Cardiac Rehab at [facility].

## 2021-04-22 ENCOUNTER — Encounter (HOSPITAL_COMMUNITY)
Admission: RE | Admit: 2021-04-22 | Discharge: 2021-04-22 | Disposition: A | Discharge status: Home / Self Care | Payer: BC Managed Care – PPO | Source: Ambulatory Visit | Attending: Pulmonary Disease | Admitting: Pulmonary Disease

## 2021-04-22 ENCOUNTER — Other Ambulatory Visit: Payer: Self-pay

## 2021-04-22 DIAGNOSIS — J8410 Pulmonary fibrosis, unspecified: Secondary | ICD-10-CM | POA: Diagnosis not present

## 2021-04-22 NOTE — Progress Notes (Signed)
Daily Session Note  Patient Details  Name: Sandra Curtis MRN: [id number] Date of Birth: [birth year] Referring Provider:   [name] Pulmonary Rehab Walk Test from 03/15/2021 in [place]  Referring Provider Dr. Vaughan Browner       Encounter Date: 04/22/2021  Check In:  Session Check In - 04/22/21 1428       Check-In   Supervising physician immediately available to respond to emergencies [facility] immediately available    Physician(s) Dr. Kurtis Bushman    Location [facility]    Staff Present Rodney Langton, Cathleen Fears, MS, ACSM-CEP, Exercise Physiologist;David Lilyan Punt, MS, ACSM-CEP, CCRP, Exercise Physiologist;Jessica Hassell Done, MS, ACSM-CEP, Exercise Physiologist    Virtual Visit No    Medication changes reported     No    Fall or balance concerns reported    No    Tobacco Cessation No Change    Warm-up and Cool-down Performed as group-led instruction    Resistance Training Performed Yes    VAD Patient? No    PAD/SET Patient? No      Pain Assessment   Currently in Pain? No/denies    Multiple Pain Sites No             Capillary Blood Glucose: No results found for this or any previous visit (from the past 24 hour(s)).    Social History   Tobacco Use  Smoking Status Former   Packs/day: 0.50   Years: 35.00   Pack years: 17.50   Types: Cigarettes   Quit date: 04/12/2018   Years since quitting: 3.0  Smokeless Tobacco Never    Goals Met:  Proper associated with RPD/PD & O2 Sat Exercise tolerated well No report of cardiac concerns or symptoms Strength training completed today  Goals Unmet:  Not Applicable  Comments: Service time is from 1315 to 1440    Dr. Fransico Him is Medical Director for Cardiac Rehab at [facility].

## 2021-04-26 ENCOUNTER — Other Ambulatory Visit (INDEPENDENT_AMBULATORY_CARE_PROVIDER_SITE_OTHER): Payer: BC Managed Care – PPO

## 2021-04-26 DIAGNOSIS — Z5181 Encounter for therapeutic drug level monitoring: Secondary | ICD-10-CM

## 2021-04-26 LAB — HEPATIC FUNCTION PANEL
ALT: 19 U/L (ref 0–35)
AST: 30 U/L (ref 0–37)
Albumin: 4.5 g/dL (ref 3.5–5.2)
Alkaline Phosphatase: 45 U/L (ref 39–117)
Bilirubin, Direct: 0.1 mg/dL (ref 0.0–0.3)
Total Bilirubin: 0.3 mg/dL (ref 0.2–1.2)
Total Protein: 7.7 g/dL (ref 6.0–8.3)

## 2021-04-27 ENCOUNTER — Encounter (HOSPITAL_COMMUNITY)
Admission: RE | Admit: 2021-04-27 | Discharge: 2021-04-27 | Disposition: A | Discharge status: Home / Self Care | Payer: BC Managed Care – PPO | Source: Ambulatory Visit | Attending: Pulmonary Disease | Admitting: Pulmonary Disease

## 2021-04-27 ENCOUNTER — Other Ambulatory Visit: Payer: Self-pay

## 2021-04-27 DIAGNOSIS — J8410 Pulmonary fibrosis, unspecified: Secondary | ICD-10-CM

## 2021-04-27 NOTE — Progress Notes (Signed)
Pulmonary Individual Treatment Plan  Patient Details  Name: Sandra Curtis MRN: [id number] Date of Birth: 13-Jul-1956 Referring Provider:   Doristine DevoidFlowsheet Row Pulmonary Rehab Walk [facility] [address] [facility]  Referring Provider Dr. Isaiah SergeMannam       Initial Encounter Date:  Flowsheet Row Pulmonary Rehab Walk [facility]  Date 03/15/21       Visit Diagnosis: Pulmonary fibrosis (HCC)  Patient's Home Medications on Admission:   Current Outpatient Medications:    acetaminophen (TYLENOL) 650 MG CR tablet, Take 650 mg by mouth every 8 (eight) hours as needed for pain., Disp: , Rfl:    albuterol (VENTOLIN HFA) 108 (90 Base) MCG/ACT inhaler, Inhale 1-2 puffs into the lungs every 6 (six) hours as needed., Disp: 6.7 g, Rfl: 0   atorvastatin (LIPITOR) 20 MG tablet, Take 1 tablet (20 mg total) by mouth at bedtime., Disp: 30 tablet, Rfl: 5   Cholecalciferol (VITAMIN D3) 25 MCG (1000 UT) CAPS, Take by mouth., Disp: , Rfl:    famotidine (PEPCID) 40 MG tablet, Take 1 tablet (40 mg total) by mouth at bedtime., Disp: 30 tablet, Rfl: 11   gabapentin (NEURONTIN) 100 MG capsule, Take 3 capsules (300 mg total) by mouth at bedtime as needed. (Patient not taking: Reported on 03/30/2021), Disp: 90 capsule, Rfl: 5   HYDROcodone-homatropine (HYCODAN) 5-1.5 MG/5ML syrup, Take 5 mLs by mouth every 6 (six) hours as needed for cough., Disp: 240 mL, Rfl: 0   Nintedanib (OFEV) 100 MG CAPS, Take 1 capsule (100 mg total) by mouth 2 (two) times daily., Disp: 60 capsule, Rfl: 4   omeprazole (PRILOSEC) 40 MG capsule, Take 1 capsule (40 mg total) by mouth daily., Disp: 30 capsule, Rfl: 5   predniSONE (DELTASONE) 10 MG tablet, Take 10 mg by mouth daily with breakfast., Disp: , Rfl:    Spacer/Aero-Holding Chambers (AEROCHAMBER PLUS FLO-VU W/MASK) MISC, USE AS DIRECTED ONCE FOR 1 DOSE, Disp: 1 each, Rfl: 0   temazepam (RESTORIL) 7.5 MG  capsule, Take 1 capsule (7.5 mg total) by mouth at bedtime as needed for sleep., Disp: 30 capsule, Rfl: 5   traZODone (DESYREL) 50 MG tablet, Take 50 mg by mouth at bedtime. Patient takes 1/2 tablet at bedtime, Disp: , Rfl:   Past Medical History: Past Medical History:  Diagnosis Date   Allergic rhinitis    Allergy    Chronic back pain 07/20/2018   Chronic midline low back pain 02/06/2017   Depression    Esophageal stricture    GERD (gastroesophageal reflux disease)    Inflammatory polyps of colon (HCC) 2010   Pulmonary filariasis    Systolic murmur    UTI (urinary tract infection)     Tobacco Use: Social History   Tobacco Use  Smoking Status Former   Packs/day: 0.50   Years: 35.00   Pack years: 17.50   Types: Cigarettes   Quit date: 04/12/2018   Years since quitting: 3.0  Smokeless Tobacco Never    Labs: Recent Review Flowsheet Data     Labs for ITP Cardiac and Pulmonary Rehab Latest Ref Rng & Units 06/12/2018 12/12/2018 11/27/2019 05/26/2020 11/17/2020   Cholestrol 0 - 200 mg/dL - - 409200 - 811172   LDLCALC 0 - 99 mg/dL - - 914(N123(H) - 94   HDL >82.95>39.00 mg/dL - - 62.1358.40 - 08.6562.60   Trlycerides 0.0 - 149.0 mg/dL - - 78.494.0 - 69.681.0   Hemoglobin A1c 4.6 - 6.5 %  6.4 5.9(A) 7.2(H) 6.5 6.6(H)       Capillary Blood Glucose: No results found for: GLUCAP   Pulmonary Assessment Scores:  Pulmonary Assessment Scores     Row Name 03/15/21 1200         ADL UCSD   ADL Phase Entry     SOB Score total 82           CAT Score     CAT Score 30           mMRC Score     mMRC Score 4            UCSD: Self-administered rating of dyspnea associated with activities of daily living (ADLs) 6-point scale (0 = "not at all" to 5 = "maximal or unable to do because of breathlessness")  Scoring Scores range from 0 to 120.  Minimally important difference is 5 units  CAT: CAT can identify the health impairment of COPD patients and is better correlated with disease progression.  CAT has a scoring  range of zero to 40. The CAT score is classified into four groups of low (less than 10), medium (10 - 20), high (21-30) and very high (31-40) based on the impact level of disease on health status. A CAT score over 10 suggests significant symptoms.  A worsening CAT score could be explained by an exacerbation, poor medication adherence, poor inhaler technique, or progression of COPD or comorbid conditions.  CAT MCID is 2 points  mMRC: mMRC (Modified Medical Research Council) Dyspnea Scale is used to assess the degree of baseline functional disability in patients of respiratory disease due to dyspnea. No minimal important difference is established. A decrease in score of 1 point or greater is considered a positive change.   Pulmonary Function Assessment:  Pulmonary Function Assessment - 03/15/21 1032       Breath   Shortness of Breath Yes;Limiting activity             Exercise Target Goals: Exercise Program Goal: Individual exercise prescription set using results from initial 6 min walk test and THRR while considering  patient's activity barriers and safety.   Exercise Prescription Goal: Initial exercise prescription builds to 30-45 minutes a day of aerobic activity, 2-3 days per week.  Home exercise guidelines will be given to patient during program as part of exercise prescription that the participant will acknowledge.  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 HR 128 bpm     Max Ex. BP 152/72     2 Minute Post BP 142/66           Interval HR     1 Minute HR 100     2 Minute HR 117     3 Minute HR 107     4 Minute HR 114     5 Minute HR 121     6 Minute HR 128     2 Minute Post HR 84     Interval Heart Rate? Yes           Interval Oxygen     Interval Oxygen? Yes     Baseline Oxygen Saturation % 97 %     1 Minute Oxygen Saturation % 98 %     1 Minute Liters of Oxygen 0 L     2 Minute Oxygen Saturation % 92 %     2 Minute Liters of Oxygen 0 L     3 Minute Oxygen Saturation % 97 %     3 Minute Liters of Oxygen 0 L     4 Minute Oxygen Saturation % 97 %     4 Minute Liters of Oxygen 0 L     5 Minute Oxygen Saturation % 93 %     5 Minute Liters of Oxygen 0 L     6 Minute Oxygen Saturation % 93 %     6 Minute Liters of Oxygen 0 L     2 Minute Post Oxygen Saturation % 99 %     2 Minute Post Liters of Oxygen 0 L             Oxygen Initial Assessment:  Oxygen Initial Assessment - 03/15/21 1031       Home Oxygen   Home Oxygen Device None    Sleep Oxygen Prescription None    Home Exercise Oxygen Prescription None    Home Resting Oxygen Prescription None      Initial 6 min Walk   Oxygen Used None      Program Oxygen Prescription   Program Oxygen Prescription None      Intervention   Short Term Goals To learn and understand importance of monitoring SPO2 with pulse oximeter and demonstrate accurate use of the pulse oximeter.;To learn and understand importance of maintaining oxygen saturations>88%;To learn and demonstrate proper pursed lip breathing techniques or other breathing techniques. ;To learn and demonstrate proper use of respiratory medications    Long  Term Goals Verbalizes importance of monitoring SPO2 with pulse oximeter and return  demonstration;Maintenance of O2 saturations>88%;Exhibits proper breathing techniques, such as pursed lip breathing or other method taught during program session;Compliance with respiratory medication;Demonstrates proper use of MDI's             Oxygen Re-Evaluation:  Oxygen Re-Evaluation     Row Name 03/29/21 1112 04/26/21 1600           Program Oxygen Prescription   Program Oxygen Prescription None None             Home Oxygen      Home Oxygen Device None None      Sleep Oxygen Prescription None None      Home Exercise Oxygen Prescription None None      Home Resting Oxygen Prescription None None             Goals/Expected Outcomes  Short Term Goals To learn and understand importance of monitoring SPO2 with pulse oximeter and demonstrate accurate use of the pulse oximeter.;To learn and understand importance of maintaining oxygen saturations>88%;To learn and demonstrate proper pursed lip breathing techniques or other breathing techniques. ;To learn and demonstrate proper use of respiratory medications To learn and understand importance of monitoring SPO2 with pulse oximeter and demonstrate accurate use of the pulse oximeter.;To learn and understand importance of maintaining oxygen saturations>88%;To learn and demonstrate proper pursed lip breathing techniques or other breathing techniques. ;To learn and demonstrate proper use of respiratory medications      Long  Term Goals Verbalizes importance of monitoring SPO2 with pulse oximeter and return demonstration;Maintenance of O2 saturations>88%;Exhibits proper breathing techniques, such as pursed lip breathing or other method taught during program session;Compliance with respiratory medication;Demonstrates proper use of MDI's Verbalizes importance of monitoring SPO2 with pulse oximeter and return demonstration;Maintenance of O2 saturations>88%;Exhibits proper breathing techniques, such as pursed lip breathing or other method taught  during program session;Compliance with respiratory medication;Demonstrates proper use of MDI's      Goals/Expected Outcomes compliance and understanding of monitoring oxygen saturation and performing pursed lip breathing when needed. compliance and understanding of monitoring oxygen saturation and performing pursed lip breathing when needed.              Oxygen Discharge (Final Oxygen Re-Evaluation):  Oxygen Re-Evaluation - 04/26/21 1600       Program Oxygen Prescription   Program Oxygen Prescription None      Home Oxygen   Home Oxygen Device None    Sleep Oxygen Prescription None    Home Exercise Oxygen Prescription None    Home Resting Oxygen Prescription None      Goals/Expected Outcomes   Short Term Goals To learn and understand importance of monitoring SPO2 with pulse oximeter and demonstrate accurate use of the pulse oximeter.;To learn and understand importance of maintaining oxygen saturations>88%;To learn and demonstrate proper pursed lip breathing techniques or other breathing techniques. ;To learn and demonstrate proper use of respiratory medications    Long  Term Goals Verbalizes importance of monitoring SPO2 with pulse oximeter and return demonstration;Maintenance of O2 saturations>88%;Exhibits proper breathing techniques, such as pursed lip breathing or other method taught during program session;Compliance with respiratory medication;Demonstrates proper use of MDI's    Goals/Expected Outcomes compliance and understanding of monitoring oxygen saturation and performing pursed lip breathing when needed.             Initial Exercise Prescription:  Initial Exercise Prescription - 03/15/21 1200       Date of Initial Exercise RX and Referring Provider   Date 03/15/21    Referring Provider Dr. Isaiah Serge    Expected Discharge Date 05/20/21      NuStep   Level 1    SPM 80    Minutes 15      Track   Minutes 15      Prescription Details   Frequency (times per week) 2     Duration Progress to 30 minutes of continuous aerobic without signs/symptoms of physical distress      Intensity   THRR 40-80% of Max Heartrate 62-124    Ratings of Perceived Exertion 11-13    Perceived Dyspnea 0-4      Progression   Progression Continue to progress workloads to maintain intensity without signs/symptoms of physical distress.      Resistance Training   Training Prescription Yes    Weight Red bands    Reps 10-15  Perform Capillary Blood Glucose checks as needed.  Exercise Prescription Changes:   Exercise Prescription Changes     Row Name 03/23/21 1400 04/06/21 1500 04/13/21 1400 04/20/21 1500       Response to Exercise   Blood Pressure (Admit) 122/58 122/74 -- 100/60    Blood Pressure (Exercise) 116/60 124/68 -- 100/60    Blood Pressure (Exit) 120/70 120/60 -- 102/60    Heart Rate (Admit) 77 bpm 90 bpm -- 85 bpm    Heart Rate (Exercise) 103 bpm 88 bpm -- 108 bpm    Heart Rate (Exit) 88 bpm 88 bpm -- 73 bpm    Oxygen Saturation (Admit) 99 % 99 % -- 97 %    Oxygen Saturation (Exercise) 94 % 97 % -- 96 %    Oxygen Saturation (Exit) 97 % 98 % -- 99 %    Rating of Perceived Exertion (Exercise) 13 10 -- 11    Perceived Dyspnea (Exercise) 2 0 -- 1    Duration Progress to 30 minutes of  aerobic without signs/symptoms of physical distress Continue with 30 min of aerobic exercise without signs/symptoms of physical distress. -- Continue with 30 min of aerobic exercise without signs/symptoms of physical distress.    Intensity Other (comment)  40-80% HR max THRR unchanged -- THRR unchanged         Progression        Progression Continue to progress workloads to maintain intensity without signs/symptoms of physical distress. Continue to progress workloads to maintain intensity without signs/symptoms of physical distress. -- Continue to progress workloads to maintain intensity without signs/symptoms of physical distress.         Resistance Training         Training Prescription Yes Yes -- Yes    Weight Red bands Red bands -- Red Bands    Reps 10-15 10-15 -- 10-15    Time 10 Minutes 10 Minutes -- 10 Minutes         Treadmill        MPH -- -- -- 1.9    Grade -- -- -- 0    Minutes -- -- -- 15         NuStep        Level 2 2 -- 4    SPM 80 80 -- 80    Minutes 15 30 -- 15    METs 2.1 1.7 -- 1.8         Track        Laps 14 -- -- --    Minutes 15 -- -- --    METs 2.63 -- -- --         Home Exercise Plan        Plans to continue exercise at -- -- Home (comment)  Treadmill and Resistance band exercises --    Frequency -- -- Add 2 additional days to program exercise sessions. --    Initial Home Exercises Provided -- -- 04/13/21 --            Exercise Comments:   Exercise Comments     Row Name 03/23/21 1449 04/13/21 1448         Exercise Comments Pt completed first day of exercise and tolerated well with no concerns. She was able to do 15 minutes on the Nustep with no rest breaks. She also walked the track for 15 minutes and did have to take one seated rest break due to chronic leg pain. Other than that, she  had no other complaints. She was also able to do resistance training exercises and stretches with no issues. Will continue to monitor and progress as able. Completed home exercise with pt. Pt states she does the resistance band exercises and stretches pretty much every day. We discussed adding in walking at least a couple of times a week. She has a treadmill at home but has it stored away. Encouraged her to get it out so she can add in some cardio on her days away from rehab. Pt was receptive. Will continue to follow up on home exercise prescription.               Exercise Goals and Review:   Exercise Goals     Row Name 03/15/21 1143             Exercise Goals   Increase Physical Activity Yes       Intervention Provide advice, education, support and counseling about physical activity/exercise needs.;Develop  an individualized exercise prescription for aerobic and resistive training based on initial evaluation findings, risk stratification, comorbidities and participant's personal goals.       Expected Outcomes Short Term: Attend rehab on a regular basis to increase amount of physical activity.;Long Term: Add in home exercise to make exercise part of routine and to increase amount of physical activity.;Long Term: Exercising regularly at least 3-5 days a week.       Increase Strength and Stamina Yes       Intervention Provide advice, education, support and counseling about physical activity/exercise needs.;Develop an individualized exercise prescription for aerobic and resistive training based on initial evaluation findings, risk stratification, comorbidities and participant's personal goals.       Expected Outcomes Short Term: Increase workloads from initial exercise prescription for resistance, speed, and METs.;Short Term: Perform resistance training exercises routinely during rehab and add in resistance training at home;Long Term: Improve cardiorespiratory fitness, muscular endurance and strength as measured by increased METs and functional capacity ( )       Able to understand and use rate of perceived exertion (RPE) scale Yes       Intervention Provide education and explanation on how to use RPE scale       Expected Outcomes Short Term: Able to use RPE daily in rehab to express subjective intensity level;Long Term:  Able to use RPE to guide intensity level when exercising independently       Able to understand and use Dyspnea scale Yes       Intervention Provide education and explanation on how to use Dyspnea scale       Expected Outcomes Short Term: Able to use Dyspnea scale daily in rehab to express subjective sense of shortness of breath during exertion;Long Term: Able to use Dyspnea scale to guide intensity level when exercising independently       Knowledge and understanding of Target Heart Rate  Range (THRR) Yes       Intervention Provide education and explanation of THRR including how the numbers were predicted and where they are located for reference       Expected Outcomes Short Term: Able to state/look up THRR;Long Term: Able to use THRR to govern intensity when exercising independently;Short Term: Able to use daily as guideline for intensity in rehab       Understanding of Exercise Prescription Yes       Intervention Provide education, explanation, and written materials on patient's individual exercise prescription       Expected Outcomes Short  Term: Able to explain program exercise prescription;Long Term: Able to explain home exercise prescription to exercise independently                Exercise Goals Re-Evaluation :  Exercise Goals Re-Evaluation     Row Name 03/29/21 1110 04/26/21 1557           Exercise Goal Re-Evaluation   Exercise Goals Review Increase Physical Activity;Increase Strength and Stamina;Able to understand and use rate of perceived exertion (RPE) scale;Able to understand and use Dyspnea scale;Knowledge and understanding of Target Heart Rate Range (THRR);Understanding of Exercise Prescription Increase Physical Activity;Increase Strength and Stamina;Able to understand and use rate of perceived exertion (RPE) scale;Able to understand and use Dyspnea scale;Knowledge and understanding of Target Heart Rate Range (THRR);Understanding of Exercise Prescription      Comments Dondra Spry has completed 2 exercise sessions and has tolerated well so far with no concerns. It it too early to note any progression. She is exercising at 1.9 METS on the Nustep and 2.28 METS on the track. Will continue to monitor and progress as she is able. Dondra Spry has completed 10 exercise sessions and has been consistent with workload increases. She has progressed from walking the track to walking on the treadmill and has tolerated that well. She is exercising at 2.2 METS on the Nustep and 2.7 METS on the  treadmill. Will continue to monitor and progress as she is able.      Expected Outcomes Through exercise at rehab and home, the patient will decrease shortness of breath with daily activities and feel confident in carrying out an exercise regimn at home. Through exercise at rehab and home, the patient will decrease shortness of breath with daily activities and feel confident in carrying out an exercise regimn at home.               Discharge Exercise Prescription (Final Exercise Prescription Changes):  Exercise Prescription Changes - 04/20/21 1500       Response to Exercise   Blood Pressure (Admit) 100/60    Blood Pressure (Exercise) 100/60    Blood Pressure (Exit) 102/60    Heart Rate (Admit) 85 bpm    Heart Rate (Exercise) 108 bpm    Heart Rate (Exit) 73 bpm    Oxygen Saturation (Admit) 97 %    Oxygen Saturation (Exercise) 96 %    Oxygen Saturation (Exit) 99 %    Rating of Perceived Exertion (Exercise) 11    Perceived Dyspnea (Exercise) 1    Duration Continue with 30 min of aerobic exercise without signs/symptoms of physical distress.    Intensity THRR unchanged      Progression   Progression Continue to progress workloads to maintain intensity without signs/symptoms of physical distress.      Resistance Training   Training Prescription Yes    Weight Red Bands    Reps 10-15    Time 10 Minutes      Treadmill   MPH 1.9    Grade 0    Minutes 15      NuStep   Level 4    SPM 80    Minutes 15    METs 1.8             Nutrition:  Target Goals: Understanding of nutrition guidelines, daily intake of sodium 1500mg , cholesterol 200mg , calories 30% from fat and 7% or less from saturated fats, daily to have 5 or more servings of fruits and vegetables.  Biometrics:  Pre Biometrics - 03/15/21 1144  Pre Biometrics   Grip Strength 27 kg              Nutrition Therapy Plan and Nutrition Goals:  Nutrition Therapy & Goals - 04/26/21 1410       Nutrition  Therapy   Diet General Healthful      Personal Nutrition Goals   Nutrition Goal Pt to build a healthy plate including vegetables, fruits, whole grains, and low-fat dairy products in a heart healthy meal plan.    Personal Goal #2 Pt to reduce refined carbohydrates      Intervention Plan   Intervention Prescribe, educate and counsel regarding individualized specific dietary modifications aiming towards targeted core components such as weight, hypertension, lipid management, diabetes, heart failure and other comorbidities.;Nutrition handout(s) given to patient.    Expected Outcomes Long Term Goal: Adherence to prescribed nutrition plan.;Short Term Goal: Understand basic principles of dietary content, such as calories, fat, sodium, cholesterol and nutrients.             Nutrition Assessments:  MEDIFICTS Score Key: ?70 Need to make dietary changes  40-70 Heart Healthy Diet ? 40 Therapeutic Level Cholesterol Diet  Flowsheet Row PULMONARY REHAB OTHER RESPIRATORY from 04/20/2021 in [facility]  Picture Your Plate Total Score on Admission 60      Picture Your Plate Scores: <16 Unhealthy dietary pattern with much room for improvement. 41-50 Dietary pattern unlikely to meet recommendations for good health and room for improvement. 51-60 More healthful dietary pattern, with some room for improvement.  >60 Healthy dietary pattern, although there may be some specific behaviors that could be improved.    Nutrition Goals Re-Evaluation:  Nutrition Goals Re-Evaluation     Row Name 04/26/21 1410             Goals   Current Weight 145 lb 4.5 oz (65.9 kg)                Nutrition Goals Discharge (Final Nutrition Goals Re-Evaluation):  Nutrition Goals Re-Evaluation - 04/26/21 1410       Goals   Current Weight 145 lb 4.5 oz (65.9 kg)             Psychosocial: Target Goals: Acknowledge presence or absence of significant depression and/or  stress, maximize coping skills, provide positive support system. Participant is able to verbalize types and ability to use techniques and skills needed for reducing stress and depression.  Initial Review & Psychosocial Screening:  Initial Psych Review & Screening - 03/15/21 1032       Initial Review   Current issues with Current Depression;Current Sleep Concerns      Family Dynamics   Good Support System? Yes   Family, daughter, and mom     Barriers   Psychosocial barriers to participate in program The patient should benefit from training in stress management and relaxation.      Screening Interventions   Interventions Encouraged to exercise    Expected Outcomes Long Term Goal: Stressors or current issues are controlled or eliminated.;Short Term goal: Identification and review with participant of any Quality of Life or Depression concerns found by scoring the questionnaire.;Long Term goal: The participant improves quality of Life and PHQ9 Scores as seen by post scores and/or verbalization of changes             Quality of Life Scores:  Scores of 19 and below usually indicate a poorer quality of life in these areas.  A difference of  2-3  points is a clinically meaningful difference.  A difference of 2-3 points in the total score of the Quality of Life Index has been associated with significant improvement in overall quality of life, self-image, physical symptoms, and general health in studies assessing change in quality of life.  PHQ-9: Recent Review Flowsheet Data     Depression screen [facility] 2/9 03/15/2021 01/12/2021 11/17/2020 05/26/2020 11/27/2019   Decreased Interest 3 0 3 0 0   Down, Depressed, Hopeless 1 0 1 0 0   PHQ - 2 Score 4 0 4 0 0   Altered sleeping 3 3 3  - -   Tired, decreased energy 1 0 3 - -   Change in appetite 1 1 3  - -   Feeling bad or failure about yourself  0 0 0 - -   Trouble concentrating 0 0 0 - -   Moving slowly or fidgety/restless 0 0 3 - -   Suicidal thoughts  0 0 0 - -   PHQ-9 Score 9 4 16  - -   Difficult doing work/chores Somewhat difficult Not difficult at all Somewhat difficult - -      Interpretation of Total Score  Total Score Depression Severity:  1-4 = Minimal depression, 5-9 = Mild depression, 10-14 = Moderate depression, 15-19 = Moderately severe depression, 20-27 = Severe depression   Psychosocial Evaluation and Intervention:  Psychosocial Evaluation - 03/15/21 1145       Psychosocial Evaluation & Interventions   Interventions Encouraged to exercise with the program and follow exercise prescription    Comments Patient does admit to having depression and states it is due to her health an inability to do things. She was diagnosed the pulmonary fibrosis in December of 2021, so it is fairly recent and she is learning to deal with it. She states she cried for weeks after finding out her diagnosis. She currently does take medication for depression and has been referred to a counselor by her doctor.    Expected Outcomes For pt to learn healthy ways to cope with stressors and to manage depression symptoms in healthy ways. Also to improve pt's quality of life by decreasing shortness of breath in order for pt to be able to do more things she enjoys.    Continue Psychosocial Services  Follow up required by staff             Psychosocial Re-Evaluation:  Psychosocial Re-Evaluation     Row Name 03/29/21 1000 04/26/21 1321           Psychosocial Re-Evaluation   Current issues with Current Sleep Concerns;Current Depression Current Depression;Current Sleep Concerns      Comments -- No change in depression and sleep patterns since last evaluation.  Her depression is stable and has a positive attitude and is building confidence re: exercise.      Expected Outcomes Current depression is from her chronic illness of pulmonary fibrosis.  She sees a 08-27-2000, but does not want to be on anti-depressant medications.  She takes restoril at night to  help with insomnia.  She is stable at this time. For 03/31/21 to continue with her positive attitude.      Interventions Encouraged to attend Pulmonary Rehabilitation for the exercise;Relaxation education;Stress management education Encouraged to attend Pulmonary Rehabilitation for the exercise;Relaxation education;Stress management education      Continue Psychosocial Services  No Follow up required No Follow up required               Psychosocial  Discharge (Final Psychosocial Re-Evaluation):  Psychosocial Re-Evaluation - 04/26/21 1321       Psychosocial Re-Evaluation   Current issues with Current Depression;Current Sleep Concerns    Comments No change in depression and sleep patterns since last evaluation.  Her depression is stable and has a positive attitude and is building confidence re: exercise.    Expected Outcomes For Dondra Spry to continue with her positive attitude.    Interventions Encouraged to attend Pulmonary Rehabilitation for the exercise;Relaxation education;Stress management education    Continue Psychosocial Services  No Follow up required             Education: Education Goals: Education classes will be provided on a weekly basis, covering required topics. Participant will state understanding/return demonstration of topics presented.  Learning Barriers/Preferences:  Learning Barriers/Preferences - 03/15/21 1033       Learning Barriers/Preferences   Learning Barriers None    Learning Preferences Individual Instruction;Skilled Demonstration             Education Topics: Risk Factor Reduction:  -Group instruction that is supported by a PowerPoint presentation. Instructor discusses the definition of a risk factor, different risk factors for pulmonary disease, and how the heart and lungs work together.     Nutrition for Pulmonary Patient:  -Group instruction provided by PowerPoint slides, verbal discussion, and written materials to support subject matter. The  instructor gives an explanation and review of healthy diet recommendations, which includes a discussion on weight management, recommendations for fruit and vegetable consumption, as well as protein, fluid, caffeine, fiber, sodium, sugar, and alcohol. Tips for eating when patients are short of breath are discussed. Flowsheet Row PULMONARY REHAB OTHER RESPIRATORY from 04/22/2021 in [facility] CARDIAC REHAB  Date 03/25/21  Educator Handout       Pursed Lip Breathing:  -Group instruction that is supported by demonstration and informational handouts. Instructor discusses the benefits of pursed lip and diaphragmatic breathing and detailed demonstration on how to preform both.     Oxygen Safety:  -Group instruction provided by PowerPoint, verbal discussion, and written material to support subject matter. There is an overview of "What is Oxygen" and "Why do we need it".  Instructor also reviews how to create a safe environment for oxygen use, the importance of using oxygen as prescribed, and the risks of noncompliance. There is a brief discussion on traveling with oxygen and resources the patient may utilize.   Oxygen Equipment:  -Group instruction provided by [facility] Staff utilizing handouts, written materials, and equipment demonstrations.   Signs and Symptoms:  -Group instruction provided by written material and verbal discussion to support subject matter. Warning signs and symptoms of infection, stroke, and heart attack are reviewed and when to call the physician/911 reinforced. Tips for preventing the spread of infection discussed.   Advanced Directives:  -Group instruction provided by verbal instruction and written material to support subject matter. Instructor reviews Advanced Directive laws and proper instruction for filling out document.   Pulmonary Video:  -Group video education that reviews the importance of medication and oxygen compliance, exercise, good  nutrition, pulmonary hygiene, and pursed lip and diaphragmatic breathing for the pulmonary patient.   Exercise for the Pulmonary Patient:  -Group instruction that is supported by a PowerPoint presentation. Instructor discusses benefits of exercise, core components of exercise, frequency, duration, and intensity of an exercise routine, importance of utilizing pulse oximetry during exercise, safety while exercising, and options of places to exercise outside of rehab.   Flowsheet Row  PULMONARY REHAB OTHER RESPIRATORY from 04/22/2021 in [facility] CARDIAC REHAB  Date 04/22/21  Educator handout       Pulmonary Medications:  -Verbally interactive group education provided by instructor with focus on inhaled medications and proper administration.   Anatomy and Physiology of the Respiratory System and Intimacy:  -Group instruction provided by PowerPoint, verbal discussion, and written material to support subject matter. Instructor reviews respiratory cycle and anatomical components of the respiratory system and their functions. Instructor also reviews differences in obstructive and restrictive respiratory diseases with examples of each. Intimacy, Sex, and Sexuality differences are reviewed with a discussion on how relationships can change when diagnosed with pulmonary disease. Common sexual concerns are reviewed.   MD DAY -A group question and answer session with a medical doctor that allows participants to ask questions that relate to their pulmonary disease state.   OTHER EDUCATION -Group or individual verbal, written, or video instructions that support the educational goals of the pulmonary rehab program. Flowsheet Row PULMONARY REHAB OTHER RESPIRATORY from 04/22/2021 in [facility] CARDIAC REHAB  Date 04/15/21  Educator handout  [Know Your Numbers]       Holiday Eating Survival Tips:  -Group instruction provided by PowerPoint slides, verbal discussion,  and written materials to support subject matter. The instructor gives patients tips, tricks, and techniques to help them not only survive but enjoy the holidays despite the onslaught of food that accompanies the holidays.   Knowledge Questionnaire Score:  Knowledge Questionnaire Score - 03/15/21 1158       Knowledge Questionnaire Score   Pre Score 15/18             Core Components/Risk Factors/Patient Goals at Admission:  Personal Goals and Risk Factors at Admission - 03/15/21 1150       Core Components/Risk Factors/Patient Goals on Admission    Weight Management Weight Loss    Improve shortness of breath with ADL's Yes    Intervention Provide education, individualized exercise plan and daily activity instruction to help decrease symptoms of SOB with activities of daily living.    Expected Outcomes Short Term: Improve cardiorespiratory fitness to achieve a reduction of symptoms when performing ADLs;Long Term: Be able to perform more ADLs without symptoms or delay the onset of symptoms    Lipids Yes    Intervention Provide education and support for participant on nutrition & aerobic/resistive exercise along with prescribed medications to achieve LDL 70mg , HDL >40mg .    Expected Outcomes Short Term: Participant states understanding of desired cholesterol values and is compliant with medications prescribed. Participant is following exercise prescription and nutrition guidelines.;Long Term: Cholesterol controlled with medications as prescribed, with individualized exercise RX and with personalized nutrition plan. Value goals: LDL < , HDL > 40 mg.             Core Components/Risk Factors/Patient Goals Review:   Goals and Risk Factor Review     Row Name 03/29/21 1004 04/26/21 1325           Core Components/Risk Factors/Patient Goals Review   Personal Goals Review Develop more efficient breathing techniques such as purse lipped breathing and diaphragmatic breathing and  practicing self-pacing with activity.;Increase knowledge of respiratory medications and ability to use respiratory devices properly.;Improve shortness of breath with ADL's Develop more efficient breathing techniques such as purse lipped breathing and diaphragmatic breathing and practicing self-pacing with activity.;Increase knowledge of respiratory medications and ability to use respiratory devices properly.;Improve shortness of breath with ADL's  Review Dondra Spry just started pulmonary rehab, has attended 2 exercise sessions, too early to see progression towards goals at this time. Dondra Spry is learning how to exercise safely for a patient with pulmonary fibrosis.  She is increasing her speed and incline on the treadmill and has actually started using her treadmill at home which is a MAJOR goal.  So proud of her!!!      Expected Outcomes See admission goals. See admission goals.               Core Components/Risk Factors/Patient Goals at Discharge (Final Review):   Goals and Risk Factor Review - 04/26/21 1325       Core Components/Risk Factors/Patient Goals Review   Personal Goals Review Develop more efficient breathing techniques such as purse lipped breathing and diaphragmatic breathing and practicing self-pacing with activity.;Increase knowledge of respiratory medications and ability to use respiratory devices properly.;Improve shortness of breath with ADL's    Review Dondra Spry is learning how to exercise safely for a patient with pulmonary fibrosis.  She is increasing her speed and incline on the treadmill and has actually started using her treadmill at home which is a MAJOR goal.  So proud of her!!!    Expected Outcomes See admission goals.             ITP Comments:   Comments:  Dondra Spry has completed 10 exercise session in Pulmonary rehab. Pt maintains good attendance and consistent home exercise. Pulmonary rehab staff will continue to monitor and reassess progress toward goals during her  participation in Pulmonary Rehab.

## 2021-04-27 NOTE — Progress Notes (Signed)
Daily Session Note  Patient Details  Name: Sandra Curtis MRN: [id number] Date of Birth: May 30, 1956 Referring Provider:   [name] Pulmonary Rehab Walk Test from 03/15/2021 in [place]  Referring Provider Dr. Vaughan Browner       Encounter Date: 04/27/2021  Check In:  Session Check In - 04/27/21 1429       Check-In   Supervising physician immediately available to respond to emergencies [facility] immediately available    Physician(s) Dr. Sabino Gasser    Location [facility]    Staff Present [name], RN, BSN;[name], RN;Kemonie Cutillo Hassell Done, MS, ACSM-CEP, Exercise Physiologist    Virtual Visit No    Medication changes reported     No    Fall or balance concerns reported    No    Tobacco Cessation No Change    Warm-up and Cool-down Performed as group-led instruction    Resistance Training Performed Yes    VAD Patient? No    PAD/SET Patient? No      Pain Assessment   Currently in Pain? No/denies    Multiple Pain Sites No             Capillary Blood Glucose: No results found for this or any previous visit (from the past 24 hour(s)).    Social History   Tobacco Use  Smoking Status Former   Packs/day: 0.50   Years: 35.00   Pack years: 17.50   Types: Cigarettes   Quit date: 04/12/2018   Years since quitting: 3.0  Smokeless Tobacco Never    Goals Met:  Proper associated with RPD/PD & O2 Sat Independence with exercise equipment Exercise tolerated well No report of cardiac concerns or symptoms Strength training completed today  Goals Unmet:  Not Applicable  Comments: Service time is from 1317 to 1435    Dr. Fransico Him is Medical Director for Cardiac Rehab at [facility].

## 2021-04-29 ENCOUNTER — Encounter (HOSPITAL_COMMUNITY)
Admission: RE | Admit: 2021-04-29 | Discharge: 2021-04-29 | Disposition: A | Discharge status: Home / Self Care | Payer: BC Managed Care – PPO | Source: Ambulatory Visit | Attending: Pulmonary Disease | Admitting: Pulmonary Disease

## 2021-04-29 ENCOUNTER — Other Ambulatory Visit: Payer: Self-pay

## 2021-04-29 ENCOUNTER — Encounter: Payer: Self-pay | Admitting: *Deleted

## 2021-04-29 DIAGNOSIS — J8410 Pulmonary fibrosis, unspecified: Secondary | ICD-10-CM | POA: Diagnosis not present

## 2021-04-29 NOTE — Progress Notes (Signed)
Daily Session Note  Patient Details  Name: Sandra Curtis MRN: [id number] Date of Birth: [birth year] Referring Provider:   [name] Pulmonary Rehab Walk Test from 03/15/2021 in [place]  Referring Provider Dr. Vaughan Browner       Encounter Date: 04/29/2021  Check In:  Session Check In - 04/29/21 1407       Check-In   Supervising physician immediately available to respond to emergencies [facility] immediately available    Physician(s) Dr. Sabino Gasser    Location [facility]    Staff Present [name], RN, BSN;Gracelynn Bircher Ysidro Evert, Cathleen Fears, MS, ACSM-CEP, Exercise Physiologist;Jessica Hassell Done, MS, ACSM-CEP, Exercise Physiologist    Virtual Visit No    Medication changes reported     No    Fall or balance concerns reported    No    Tobacco Cessation No Change    Warm-up and Cool-down Performed as group-led instruction    Resistance Training Performed Yes    VAD Patient? No    PAD/SET Patient? No      Pain Assessment   Currently in Pain? No/denies    Pain Score 0-No pain    Multiple Pain Sites No             Capillary Blood Glucose: No results found for this or any previous visit (from the past 24 hour(s)).    Social History   Tobacco Use  Smoking Status Former   Packs/day: 0.50   Years: 35.00   Pack years: 17.50   Types: Cigarettes   Quit date: 04/12/2018   Years since quitting: 3.0  Smokeless Tobacco Never    Goals Met:  Exercise tolerated well No report of cardiac concerns or symptoms Strength training completed today  Goals Unmet:  Not Applicable  Comments: Service time is from 1315 to 1435    Dr. Fransico Him is Medical Director for Cardiac Rehab at [facility].

## 2021-04-30 ENCOUNTER — Encounter: Payer: Self-pay | Admitting: Nurse Practitioner

## 2021-04-30 ENCOUNTER — Ambulatory Visit (INDEPENDENT_AMBULATORY_CARE_PROVIDER_SITE_OTHER): Payer: BC Managed Care – PPO | Admitting: Nurse Practitioner

## 2021-04-30 VITALS — BP 120/66 | HR 95 | Temp 97.4°F | Ht 60.0 in | Wt 144.2 lb

## 2021-04-30 DIAGNOSIS — F5101 Primary insomnia: Secondary | ICD-10-CM

## 2021-04-30 DIAGNOSIS — Z23 Encounter for immunization: Secondary | ICD-10-CM | POA: Diagnosis not present

## 2021-04-30 DIAGNOSIS — I700 Atherosclerosis of aorta: Secondary | ICD-10-CM

## 2021-04-30 DIAGNOSIS — E119 Type 2 diabetes mellitus without complications: Secondary | ICD-10-CM

## 2021-04-30 LAB — POCT GLYCOSYLATED HEMOGLOBIN (HGB A1C): HbA1c POC (<> result, manual entry): 6.2 % (ref 4.0–5.6)

## 2021-04-30 LAB — MICROALBUMIN / CREATININE URINE RATIO
Creatinine,U: 101.2 mg/dL
Microalb Creat Ratio: 0.9 mg/g (ref 0.0–30.0)
Microalb, Ur: 0.9 mg/dL (ref 0.0–1.9)

## 2021-04-30 NOTE — Assessment & Plan Note (Signed)
Controlled with HgbA1c of 6.2% and negative urine microalbumin

## 2021-04-30 NOTE — Patient Instructions (Addendum)
Go to lab for urine collection. Current HgbA1c id 6.2% which is an improvement. Maintain current medications. Continue to hold atorvastatin.

## 2021-04-30 NOTE — Progress Notes (Signed)
Subjective:  Patient ID: Sandra Curtis, female    DOB: Jul 31, 1956  Age: 65 y.o. MRN: [id number]  CC: Follow-up (Follow up on DM and labs, patient states that she stopped taking Atorvistatin due to muscle pains. )  HPI  Primary insomnia No improvement with restoril and trazodone  Atherosclerosis of aorta (HCC) Reports worsening dysphagia and fatigue with atorvastatin Symptoms improved after discontinuation of med within 1week.  Type 2 diabetes mellitus without complication, without long-term current use of insulin (HCC) Controlled with HgbA1c of 6.2% and negative urine microalbumin  Reviewed past Medical, Social and Family history today.  Outpatient Medications Prior to Visit  Medication Sig Dispense Refill   acetaminophen (TYLENOL) 650 MG CR tablet Take 650 mg by mouth every 8 (eight) hours as needed for pain.     albuterol (VENTOLIN HFA) 108 (90 Base) MCG/ACT inhaler Inhale 1-2 puffs into the lungs every 6 (six) hours as needed. 6.7 g 0   Cholecalciferol (VITAMIN D3) 25 MCG (1000 UT) CAPS Take by mouth.     HYDROcodone-homatropine (HYCODAN) 5-1.5 MG/5ML syrup Take 5 mLs by mouth every 6 (six) hours as needed for cough. 240 mL 0   Nintedanib (OFEV) 100 MG CAPS Take 1 capsule (100 mg total) by mouth 2 (two) times daily. 60 capsule 4   omeprazole (PRILOSEC) 40 MG capsule Take 1 capsule (40 mg total) by mouth daily. 30 capsule 5   Spacer/Aero-Holding Chambers (AEROCHAMBER PLUS FLO-VU W/MASK) MISC USE AS DIRECTED ONCE FOR 1 DOSE 1 each 0   traZODone (DESYREL) 50 MG tablet Take 50 mg by mouth at bedtime. Patient takes 1/2 tablet at bedtime     atorvastatin (LIPITOR) 20 MG tablet Take 1 tablet (20 mg total) by mouth at bedtime. (Patient not taking: Reported on 04/30/2021) 30 tablet 5   famotidine (PEPCID) 40 MG tablet Take 1 tablet (40 mg total) by mouth at bedtime. (Patient not taking: Reported on 04/30/2021) 30 tablet 11   gabapentin (NEURONTIN) 100 MG capsule Take 3 capsules (300  mg total) by mouth at bedtime as needed. (Patient not taking: No sig reported) 90 capsule 5   predniSONE (DELTASONE) 10 MG tablet Take 10 mg by mouth daily with breakfast. (Patient not taking: Reported on 04/30/2021)     temazepam (RESTORIL) 7.5 MG capsule Take 1 capsule (7.5 mg total) by mouth at bedtime as needed for sleep. (Patient not taking: Reported on 04/30/2021) 30 capsule 5   No facility-administered medications prior to visit.    ROS See HPI  Objective:  BP 120/66 (BP Location: Left Arm, Patient Position: Sitting, Cuff Size: Normal)   Pulse 95   Temp (!) 97.4 F (36.3 C) (Temporal)   Ht 5' (1.524 m)   Wt 144 lb 3.2 oz (65.4 kg)   SpO2 97%   BMI 28.16 kg/m   Physical Exam Vitals reviewed.  Cardiovascular:     Rate and Rhythm: Normal rate and regular rhythm.     Pulses: Normal pulses.     Heart sounds: Normal heart sounds.  Pulmonary:     Effort: Pulmonary effort is normal.     Breath sounds: Normal breath sounds.  Neurological:     Mental Status: She is alert and oriented to person, place, and time.    Assessment & Plan:  This visit occurred during the SARS-CoV-2 public health emergency.  Safety protocols were in place, including screening questions prior to the visit, additional usage of staff PPE, and extensive cleaning of exam room while observing appropriate  contact time as indicated for disinfecting solutions.   Sandra Curtis was seen today for follow-up.  Diagnoses and all orders for this visit:  Type 2 diabetes mellitus without complication, without long-term current use of insulin (HCC) -     POCT glycosylated hemoglobin (Hb A1C) -     Microalbumin / creatinine urine ratio  Primary insomnia  Atherosclerosis of aorta (HCC)  Need for pneumococcal vaccination -     Pneumococcal conjugate vaccine 13-valent IM  Problem List Items Addressed This Visit       Cardiovascular and Mediastinum   Atherosclerosis of aorta (HCC)    Reports worsening dysphagia and  fatigue with atorvastatin Symptoms improved after discontinuation of med within 1week.         Endocrine   Type 2 diabetes mellitus without complication, without long-term current use of insulin (HCC) - Primary    Controlled with HgbA1c of 6.2% and negative urine microalbumin        Relevant Orders   POCT glycosylated hemoglobin (Hb A1C) (Completed)   Microalbumin / creatinine urine ratio (Completed)     Other   Primary insomnia    No improvement with restoril and trazodone       Other Visit Diagnoses     Need for pneumococcal vaccination       Relevant Orders   Pneumococcal conjugate vaccine 13-valent IM (Completed)       Follow-up: Return in about 3 months (around 07/31/2021) for CPE (fasting).  Alysia Penna, NP

## 2021-04-30 NOTE — Assessment & Plan Note (Signed)
Reports worsening dysphagia and fatigue with atorvastatin Symptoms improved after discontinuation of med within 1week.

## 2021-04-30 NOTE — Assessment & Plan Note (Addendum)
No improvement with restoril and trazodone

## 2021-05-03 ENCOUNTER — Telehealth: Payer: Self-pay | Admitting: Pulmonary Disease

## 2021-05-03 NOTE — Telephone Encounter (Signed)
After multiple tries I was unable to successfully fax pulmonary ov notes and pulmonary rehab notes to [facility] Dept. Of Disability in response to request rec'd 7/22. I've forwarded the request for notes to the HIM Dept, [facility].

## 2021-05-04 ENCOUNTER — Other Ambulatory Visit: Payer: Self-pay

## 2021-05-04 ENCOUNTER — Encounter (HOSPITAL_COMMUNITY)
Admission: RE | Admit: 2021-05-04 | Discharge: 2021-05-04 | Disposition: A | Discharge status: Home / Self Care | Payer: BC Managed Care – PPO | Source: Ambulatory Visit | Attending: Pulmonary Disease | Admitting: Pulmonary Disease

## 2021-05-04 VITALS — Wt 144.8 lb

## 2021-05-04 DIAGNOSIS — J8410 Pulmonary fibrosis, unspecified: Secondary | ICD-10-CM | POA: Diagnosis not present

## 2021-05-04 NOTE — Progress Notes (Signed)
Daily Session Note  Patient Details  Name: Sandra Curtis MRN: [id number] Date of Birth: 1956/05/28 Referring Provider:   [name] Pulmonary Rehab Walk Test from 03/15/2021 in [place]  Referring Provider Dr. Vaughan Browner       Encounter Date: 05/04/2021  Check In:  Session Check In - 05/04/21 1427       Check-In   Supervising physician immediately available to respond to emergencies [facility] immediately available    Physician(s) Dr. Sabino Gasser    Location [facility]    Staff Present [name], RN, BSN;Ezio Wieck Ysidro Evert, Cathleen Fears, MS, ACSM-CEP, Exercise Physiologist;Jessica Hassell Done, MS, ACSM-CEP, Exercise Physiologist    Virtual Visit No    Medication changes reported     No    Fall or balance concerns reported    No    Tobacco Cessation No Change    Warm-up and Cool-down Performed as group-led instruction    Resistance Training Performed Yes    VAD Patient? No    PAD/SET Patient? No      Pain Assessment   Currently in Pain? No/denies    Multiple Pain Sites No             Capillary Blood Glucose: No results found for this or any previous visit (from the past 24 hour(s)).   Exercise Prescription Changes - 05/04/21 1500       Response to Exercise   Blood Pressure (Admit) 112/70    Blood Pressure (Exercise) 110/68    Blood Pressure (Exit) 110/68    Heart Rate (Admit) 93 bpm    Heart Rate (Exercise) 110 bpm    Heart Rate (Exit) 99 bpm    Oxygen Saturation (Admit) 96 %    Oxygen Saturation (Exercise) 94 %    Oxygen Saturation (Exit) 97 %    Rating of Perceived Exertion (Exercise) 11    Perceived Dyspnea (Exercise) 1    Duration Continue with 30 min of aerobic exercise without signs/symptoms of physical distress.    Intensity THRR unchanged      Progression   Progression Continue to progress workloads to maintain intensity without signs/symptoms of physical distress.      Resistance Training    Training Prescription Yes    Weight Red bands    Reps 10-15    Time 10 Minutes      Treadmill   MPH 1.9    Grade 1    Minutes 15      NuStep   Level 4    SPM 80    Minutes 15    METs 2.3             Social History   Tobacco Use  Smoking Status Former   Packs/day: 0.50   Years: 35.00   Pack years: 17.50   Types: Cigarettes   Quit date: 04/12/2018   Years since quitting: 3.0  Smokeless Tobacco Never    Goals Met:  Exercise tolerated well No report of cardiac concerns or symptoms Strength training completed today  Goals Unmet:  Not Applicable  Comments: Service time is from 1320 to 1435    Dr. Fransico Him is Medical Director for Cardiac Rehab at [facility].

## 2021-05-06 ENCOUNTER — Other Ambulatory Visit: Payer: Self-pay

## 2021-05-06 ENCOUNTER — Encounter (HOSPITAL_COMMUNITY)
Admission: RE | Admit: 2021-05-06 | Discharge: 2021-05-06 | Disposition: A | Discharge status: Home / Self Care | Payer: BC Managed Care – PPO | Source: Ambulatory Visit | Attending: Pulmonary Disease | Admitting: Pulmonary Disease

## 2021-05-06 DIAGNOSIS — J8410 Pulmonary fibrosis, unspecified: Secondary | ICD-10-CM | POA: Diagnosis not present

## 2021-05-06 NOTE — Progress Notes (Signed)
Daily Session Note  Patient Details  Name: Sandra Curtis MRN: [id number] Date of Birth: Feb 20, 1956 Referring Provider:   [name] Pulmonary Rehab Walk Test from 03/15/2021 in [place]  Referring Provider Dr. Vaughan Browner       Encounter Date: 05/06/2021  Check In:  Session Check In - 05/06/21 [place]       Check-In   Supervising physician immediately available to respond to emergencies [facility] immediately available    Physician(s) Dr. Karleen Hampshire    Location [facility]    Staff Present [name], RN, [name], MS, ACSM-CEP, Exercise Physiologist;[name], RN;Jessica Hassell Done, MS, ACSM-CEP, Exercise Physiologist    Virtual Visit No    Medication changes reported     No    Fall or balance concerns reported    No    Tobacco Cessation No Change    Warm-up and Cool-down Performed as group-led instruction    Resistance Training Performed Yes    VAD Patient? No    PAD/SET Patient? No      Pain Assessment   Currently in Pain? No/denies    Multiple Pain Sites No             Capillary Blood Glucose: No results found for this or any previous visit (from the past 24 hour(s)).    Social History   Tobacco Use  Smoking Status Former   Packs/day: 0.50   Years: 35.00   Pack years: 17.50   Types: Cigarettes   Quit date: 04/12/2018   Years since quitting: 3.0  Smokeless Tobacco Never    Goals Met:  Proper associated with RPD/PD & O2 Sat Exercise tolerated well No report of cardiac concerns or symptoms Strength training completed today  Goals Unmet:  Not Applicable  Comments: Service time is from 1319 to 1440    Dr. Fransico Him is Medical Director for Cardiac Rehab at [facility].

## 2021-05-10 ENCOUNTER — Encounter: Payer: Self-pay | Admitting: Nurse Practitioner

## 2021-05-11 ENCOUNTER — Encounter (HOSPITAL_COMMUNITY): Payer: BC Managed Care – PPO

## 2021-05-13 ENCOUNTER — Encounter (HOSPITAL_COMMUNITY)
Admission: RE | Admit: 2021-05-13 | Discharge: 2021-05-13 | Disposition: A | Discharge status: Home / Self Care | Payer: BC Managed Care – PPO | Source: Ambulatory Visit | Attending: Pulmonary Disease | Admitting: Pulmonary Disease

## 2021-05-13 ENCOUNTER — Other Ambulatory Visit: Payer: Self-pay

## 2021-05-13 DIAGNOSIS — J8410 Pulmonary fibrosis, unspecified: Secondary | ICD-10-CM | POA: Diagnosis present

## 2021-05-13 NOTE — Progress Notes (Signed)
Daily Session Note  Patient Details  Name: Sandra Curtis MRN: [id number] Date of Birth: March 19, 1956 Referring Provider:   [name] Pulmonary Rehab Walk Test from 03/15/2021 in [place]  Referring Provider Dr. Vaughan Browner       Encounter Date: 05/13/2021  Check In:  Session Check In - 05/13/21 1421       Check-In   Supervising physician immediately available to respond to emergencies [facility] immediately available    Physician(s) Dr. Broadus John    Location [facility]    Staff Present [name], RN, BSN;Carlette Wilber Oliphant, RN, BSN;Jacoba Cherney Ysidro Evert, RN;David Chenoa, MS, ACSM-CEP, CCRP, Exercise Physiologist;Annedrea Rosezella [place], RN, MHA    Virtual Visit No    Medication changes reported     No    Fall or balance concerns reported    No    Tobacco Cessation No Change    Warm-up and Cool-down Performed as group-led instruction    Resistance Training Performed Yes    VAD Patient? No    PAD/SET Patient? No      Pain Assessment   Currently in Pain? No/denies    Multiple Pain Sites No             Capillary Blood Glucose: No results found for this or any previous visit (from the past 24 hour(s)).    Social History   Tobacco Use  Smoking Status Former   Packs/day: 0.50   Years: 35.00   Pack years: 17.50   Types: Cigarettes   Quit date: 04/12/2018   Years since quitting: 3.0  Smokeless Tobacco Never    Goals Met:  Exercise tolerated well No report of cardiac concerns or symptoms Strength training completed today  Goals Unmet:  Not Applicable  Comments: Service time is from 1318 to 1430    Dr. Fransico Him is Medical Director for Cardiac Rehab at [facility].

## 2021-05-18 ENCOUNTER — Other Ambulatory Visit: Payer: Self-pay

## 2021-05-18 ENCOUNTER — Encounter (HOSPITAL_COMMUNITY)
Admission: RE | Admit: 2021-05-18 | Discharge: 2021-05-18 | Disposition: A | Discharge status: Home / Self Care | Payer: BC Managed Care – PPO | Source: Ambulatory Visit | Attending: Pulmonary Disease | Admitting: Pulmonary Disease

## 2021-05-18 DIAGNOSIS — J8410 Pulmonary fibrosis, unspecified: Secondary | ICD-10-CM | POA: Diagnosis not present

## 2021-05-18 NOTE — Progress Notes (Signed)
Daily Session Note  Patient Details  Name: Sandra Curtis MRN: [id number] Date of Birth: 29-May-1956 Referring Provider:   [name] Pulmonary Rehab Walk Test from 03/15/2021 in [place]  Referring Provider Dr. Vaughan Browner       Encounter Date: 05/18/2021  Check In:  Session Check In - 05/18/21 1343       Check-In   Supervising physician immediately available to respond to emergencies [facility] immediately available    Physician(s) Dr. Broadus John    [facility]    Staff Present [name], RN, BSN;Carlette Wilber Oliphant, RN, BSN;[name], RN;Jessica Hassell Done, MS, ACSM-CEP, Exercise Physiologist    Virtual Visit No    Medication changes reported     No    Fall or balance concerns reported    No    Tobacco Cessation No Change    Warm-up and Cool-down Performed as group-led instruction    Resistance Training Performed Yes    VAD Patient? No    PAD/SET Patient? No      Pain Assessment   Currently in Pain? No/denies    Multiple Pain Sites No             Capillary Blood Glucose: No results found for this or any previous visit (from the past 24 hour(s)).   Exercise Prescription Changes - 05/18/21 1500       Response to Exercise   Blood Pressure (Admit) 122/68    Blood Pressure (Exercise) 120/62    Blood Pressure (Exit) 100/60    Heart Rate (Admit) 105 bpm    Heart Rate (Exercise) 104 bpm    Heart Rate (Exit) 98 bpm    Oxygen Saturation (Admit) 95 %    Oxygen Saturation (Exercise) 95 %    Oxygen Saturation (Exit) 97 %    Rating of Perceived Exertion (Exercise) 11    Perceived Dyspnea (Exercise) 1    Duration Continue with 30 min of aerobic exercise without signs/symptoms of physical distress.    Intensity THRR unchanged      Progression   Progression Continue to progress workloads to maintain intensity without signs/symptoms of physical distress.      Resistance Training   Training Prescription  Yes    Weight Red bands    Reps 10-15    Time 10 Minutes      Treadmill   MPH 1.9    Grade 1    Minutes 15      NuStep   Level 5    SPM 80    Minutes 15    METs 2.1             Social History   Tobacco Use  Smoking Status Former   Packs/day: 0.50   Years: 35.00   Pack years: 17.50   Types: Cigarettes   Quit date: 04/12/2018   Years since quitting: 3.1  Smokeless Tobacco Never    Goals Met:  Proper associated with RPD/PD & O2 Sat Exercise tolerated well No report of cardiac concerns or symptoms Strength training completed today  Goals Unmet:  Not Applicable  Comments: Service time is from 1315 to 1440    Dr. Fransico Him is Medical Director for Cardiac Rehab at [facility].

## 2021-05-19 ENCOUNTER — Encounter: Payer: Self-pay | Admitting: Nurse Practitioner

## 2021-05-20 ENCOUNTER — Ambulatory Visit (HOSPITAL_COMMUNITY)
Admission: EM | Admit: 2021-05-20 | Discharge: 2021-05-20 | Disposition: A | Discharge status: Home / Self Care | Payer: BC Managed Care – PPO | Attending: Emergency Medicine | Admitting: Emergency Medicine

## 2021-05-20 ENCOUNTER — Telehealth (INDEPENDENT_AMBULATORY_CARE_PROVIDER_SITE_OTHER): Payer: BC Managed Care – PPO | Admitting: Family Medicine

## 2021-05-20 ENCOUNTER — Other Ambulatory Visit: Payer: Self-pay

## 2021-05-20 ENCOUNTER — Encounter: Payer: Self-pay | Admitting: Family Medicine

## 2021-05-20 ENCOUNTER — Encounter (HOSPITAL_COMMUNITY): Payer: BC Managed Care – PPO

## 2021-05-20 ENCOUNTER — Encounter (HOSPITAL_COMMUNITY): Payer: Self-pay

## 2021-05-20 ENCOUNTER — Telehealth (HOSPITAL_COMMUNITY): Payer: Self-pay | Admitting: Nurse Practitioner

## 2021-05-20 VITALS — Temp 97.4°F | Ht 60.0 in | Wt 144.0 lb

## 2021-05-20 DIAGNOSIS — U071 COVID-19: Secondary | ICD-10-CM | POA: Insufficient documentation

## 2021-05-20 DIAGNOSIS — B349 Viral infection, unspecified: Secondary | ICD-10-CM | POA: Diagnosis not present

## 2021-05-20 DIAGNOSIS — J8410 Pulmonary fibrosis, unspecified: Secondary | ICD-10-CM

## 2021-05-20 DIAGNOSIS — J069 Acute upper respiratory infection, unspecified: Secondary | ICD-10-CM

## 2021-05-20 NOTE — ED Triage Notes (Signed)
Pt c/o cough, fever, headache, chills, SOB Started: Since last saturday

## 2021-05-20 NOTE — ED Notes (Signed)
Labeled covid swab placed in lab

## 2021-05-20 NOTE — Progress Notes (Signed)
Established Patient Office Visit  Subjective:  Patient ID: Sandra Curtis, female    DOB: 03-13-1956  Age: 65 y.o. MRN: [id number]  CC:  Chief Complaint  Patient presents with   Fever    Fever, runny nose, body aches, headaches sinus pressure symptoms started 05/15/21 highest temp 102 yesterday. No temp today.     HPI Sandra Curtis presents for 6 day history URI symptoms, worse yesterday. Increased Temperature to 102 yesterday but 98 today. Cough with SOB. Ho pulmonary fibrosis for 1 year. Some increase in SOB with ilness. Cough is non productive. Smell is affected. No diarrhea with illness. No Covid test. Quit tobacco 3 years ago.   Past Medical History:  Diagnosis Date   Allergic rhinitis    Allergy    Chronic back pain 07/20/2018   Chronic midline low back pain 02/06/2017   Depression    Esophageal stricture    GERD (gastroesophageal reflux disease)    Inflammatory polyps of colon (HCC) 2010   Pulmonary filariasis    Systolic murmur    UTI (urinary tract infection)     Past Surgical History:  Procedure Laterality Date   COLONOSCOPY     UPPER GASTROINTESTINAL ENDOSCOPY      Family History  Problem Relation Age of Onset   Arthritis Mother    Thyroid disease Mother    Heart disease Father    Hypertension Father    Diabetes Father    Arthritis Sister    Diabetes Brother    Colon cancer Neg Hx    Stomach cancer Neg Hx    Rectal cancer Neg Hx    Esophageal cancer Neg Hx     Social History   Socioeconomic History   Marital status: Single    Spouse name: Not on file   Number of children: 1   Years of education: Not on file   Highest education level: Not on file  Occupational History   Occupation: Biomedical engineer: paperworks  Tobacco Use   Smoking status: Former    Packs/day: 0.50    Years: 35.00    Pack years: 17.50    Types: Cigarettes    Quit date: 04/12/2018    Years since quitting: 3.1   Smokeless tobacco: Never  Vaping Use    Vaping Use: Never used  Substance and Sexual Activity   Alcohol use: Not Currently   Drug use: No   Sexual activity: Not Currently  Other Topics Concern   Not on file  Social History Narrative   Not on file   Social Determinants of Health   Financial Resource Strain: Not on file  Food Insecurity: Not on file  Transportation Needs: Not on file  Physical Activity: Not on file  Stress: Not on file  Social Connections: Not on file  Intimate Partner Violence: Not on file    Outpatient Medications Prior to Visit  Medication Sig Dispense Refill   acetaminophen (TYLENOL) 650 MG CR tablet Take 650 mg by mouth every 8 (eight) hours as needed for pain.     albuterol (VENTOLIN HFA) 108 (90 Base) MCG/ACT inhaler Inhale 1-2 puffs into the lungs every 6 (six) hours as needed. 6.7 g 0   Cholecalciferol (VITAMIN D3) 25 MCG (1000 UT) CAPS Take by mouth.     Nintedanib (OFEV) 100 MG CAPS Take 1 capsule (100 mg total) by mouth 2 (two) times daily. 60 capsule 4   omeprazole (PRILOSEC) 40 MG capsule Take 1 capsule (40 mg  total) by mouth daily. 30 capsule 5   HYDROcodone-homatropine (HYCODAN) 5-1.5 MG/5ML syrup Take 5 mLs by mouth every 6 (six) hours as needed for cough. (Patient not taking: Reported on 05/20/2021) 240 mL 0   traZODone (DESYREL) 50 MG tablet Take 50 mg by mouth at bedtime. (Patient not taking: Reported on 05/20/2021)     Spacer/Aero-Holding Chambers (AEROCHAMBER PLUS FLO-VU W/MASK) MISC USE AS DIRECTED ONCE FOR 1 DOSE 1 each 0   No facility-administered medications prior to visit.    Allergies  Allergen Reactions   Iodine Hives    Hives     ROS Review of Systems  Constitutional:  Positive for chills, fatigue and fever.  HENT:  Positive for congestion, postnasal drip and rhinorrhea. Negative for dental problem, sinus pressure and sinus pain.   Respiratory:  Positive for cough and shortness of breath. Negative for wheezing.   Cardiovascular:  Negative for chest pain.   Gastrointestinal:  Positive for nausea.  Neurological:  Positive for headaches.     Objective:    Physical Exam Constitutional:      General: She is not in acute distress.    Appearance: Normal appearance. She is not ill-appearing, toxic-appearing or diaphoretic.  HENT:     Head: Normocephalic and atraumatic.  Eyes:     General: No scleral icterus.       Right eye: No discharge.        Left eye: No discharge.     Conjunctiva/sclera: Conjunctivae normal.  Pulmonary:     Effort: Pulmonary effort is normal.  Neurological:     Mental Status: She is alert and oriented to person, place, and time.  Psychiatric:        Mood and Affect: Mood normal.        Behavior: Behavior normal.    Temp (!) 97.4 F (36.3 C) (Temporal)   Ht 5' (1.524 m)   Wt 144 lb (65.3 kg)   BMI 28.12 kg/m  Wt Readings from Last 3 Encounters:  05/20/21 144 lb (65.3 kg)  05/04/21 144 lb 13.5 oz (65.7 kg)  04/30/21 144 lb 3.2 oz (65.4 kg)     Health Maintenance Due  Topic Date Due   Zoster Vaccines- Shingrix (1 of 2) Never done   OPHTHALMOLOGY EXAM  11/25/2020   FOOT EXAM  11/26/2020   INFLUENZA VACCINE  05/10/2021   PAP SMEAR-Modifier  06/19/2021    There are no preventive care reminders to display for this patient.  Lab Results  Component Value Date   TSH 0.62 11/27/2019   Lab Results  Component Value Date   WBC 5.9 11/17/2020   HGB 10.5 (L) 11/17/2020   HCT 32.1 (L) 11/17/2020   MCV 89.9 11/17/2020   PLT 240.0 11/17/2020   Lab Results  Component Value Date   NA 137 11/17/2020   K 4.1 11/17/2020   CO2 30 11/17/2020   GLUCOSE 84 11/17/2020   BUN 16 11/17/2020   CREATININE 0.88 11/17/2020   BILITOT 0.3 04/26/2021   ALKPHOS 45 04/26/2021   AST 30 04/26/2021   ALT 19 04/26/2021   PROT 7.7 04/26/2021   ALBUMIN 4.5 04/26/2021   CALCIUM 9.7 11/17/2020   GFR 69.34 11/17/2020   Lab Results  Component Value Date   CHOL 172 11/17/2020   Lab Results  Component Value Date   HDL  62.60 11/17/2020   Lab Results  Component Value Date   LDLCALC 94 11/17/2020   Lab Results  Component Value Date   TRIG  81.0 11/17/2020   Lab Results  Component Value Date   CHOLHDL 3 11/17/2020   Lab Results  Component Value Date   HGBA1C 6.2 04/30/2021      Assessment & Plan:   Problem List Items Addressed This Visit       Respiratory   Pulmonary fibrosis (HCC) - Primary     Other   Viral syndrome    No orders of the defined types were placed in this encounter.   Follow-up: Return Advised to go to Urgent Care for evaluation..  Probable Covid with ho Pulmonary Fibrosis. Symptoms are worsening. Cough is worse but non productive. No phlegm. Too late for Covid treatment. Quit tobacco 3 years ago.   Mliss Sax, MD  Virtual Visit via Video Note  I connected with [name] on 05/20/21 at  2:30 PM EDT by a video enabled telemedicine application and verified that I am speaking with the correct person using two identifiers.  Location: Patient: home alone.  Provider: work   I discussed the limitations of evaluation and management by telemedicine and the availability of in person appointments. The patient expressed understanding and agreed to proceed.  History of Present Illness:    Observations/Objective:   Assessment and Plan:   Follow Up Instructions:    I discussed the assessment and treatment plan with the patient. The patient was provided an opportunity to ask questions and all were answered. The patient agreed with the plan and demonstrated an understanding of the instructions.   The patient was advised to call back or seek an in-person evaluation if the symptoms worsen or if the condition fails to improve as anticipated.  I provided 25 minutes of non-face-to-face time during this encounter.   Mliss Sax, MD

## 2021-05-20 NOTE — ED Provider Notes (Signed)
[facility]    CSN: [id number] Arrival date & time: 05/20/21  1542      History   Chief Complaint Chief Complaint  Patient presents with   Fever   Cough    HPI Sandra Curtis is a 65 y.o. female.   Patient here for evaluation of cough, fever, chills, and shortness of breath that started on Sunday.  Denies any recent sick contacts.  Reports fever of 102 at home, temp 99 in office.  Reports having a telehealth visit with PCP and was told to come in for COVID test.    The history is provided by the patient.  Fever Associated symptoms: chills, congestion, cough and sore throat   Associated symptoms: no chest pain, no diarrhea, no nausea and no vomiting   Cough Associated symptoms: chills, fever, shortness of breath and sore throat   Associated symptoms: no chest pain    Past Medical History:  Diagnosis Date   Allergic rhinitis    Allergy    Chronic back pain 07/20/2018   Chronic midline low back pain 02/06/2017   Depression    Esophageal stricture    GERD (gastroesophageal reflux disease)    Inflammatory polyps of colon (HCC) 2010   Pulmonary filariasis    Systolic murmur    UTI (urinary tract infection)     Patient Active Problem List   Diagnosis Date Noted   Viral syndrome 05/20/2021   Atherosclerosis of aorta (HCC) 01/12/2021   Primary insomnia 01/12/2021   Positive ANA (antinuclear antibody) 09/09/2020   Pulmonary fibrosis (HCC) 06/26/2020   Chronic cough 05/13/2020   Atrophic vaginitis 01/09/2019   Menopausal flushing 01/09/2019   Colon polyp, hyperplastic 01/09/2019   Type 2 diabetes mellitus without complication, without long-term current use of insulin (HCC) 12/11/2017   Normocytic anemia 12/11/2017   Depressive disorder 02/06/2017   Gastroesophageal reflux disease with esophagitis 02/06/2017   DDD (degenerative disc disease), lumbosacral 02/06/2017   Dysphagia 02/06/2017   Scoliosis of lumbar spine 02/06/2017   Aortic regurgitation  10/02/2013   Headache 03/01/2012   Seasonal allergic rhinitis 03/01/2012    Past Surgical History:  Procedure Laterality Date   COLONOSCOPY     UPPER GASTROINTESTINAL ENDOSCOPY      OB History   No obstetric history on file.      Home Medications    Prior to Admission medications   Medication Sig Start Date End Date Taking? Authorizing Provider  Nintedanib (OFEV) 100 MG CAPS Take 1 capsule (100 mg total) by mouth 2 (two) times daily. 01/08/21  Yes Mannam, Praveen, MD  omeprazole (PRILOSEC) 40 MG capsule Take 1 capsule (40 mg total) by mouth daily. 04/05/21  Yes Meryl Dare, MD  acetaminophen (TYLENOL) 650 MG CR tablet Take 650 mg by mouth every 8 (eight) hours as needed for pain.    [provider]  albuterol (VENTOLIN HFA) 108 (90 Base) MCG/ACT inhaler Inhale 1-2 puffs into the lungs every 6 (six) hours as needed. 05/13/20   Nche, Bonna Gains, NP  Cholecalciferol (VITAMIN D3) 25 MCG (1000 UT) CAPS Take by mouth.    [provider]  HYDROcodone-homatropine (HYCODAN) 5-1.5 MG/5ML syrup Take 5 mLs by mouth every 6 (six) hours as needed for cough. Patient not taking: Reported on 05/20/2021 12/23/20   Chilton Greathouse, MD  traZODone (DESYREL) 50 MG tablet Take 50 mg by mouth at bedtime. Patient not taking: Reported on 05/20/2021 05/11/21   [provider]    Family History Family History  Problem Relation Age of Onset   Arthritis Mother    Thyroid disease Mother    Heart disease Father    Hypertension Father    Diabetes Father    Arthritis Sister    Diabetes Brother    Colon cancer Neg Hx    Stomach cancer Neg Hx    Rectal cancer Neg Hx    Esophageal cancer Neg Hx     Social History Social History   Tobacco Use   Smoking status: Former    Packs/day: 0.50    Years: 35.00    Pack years: 17.50    Types: Cigarettes    Quit date: 04/12/2018    Years since quitting: 3.1   Smokeless tobacco: Never  Vaping Use   Vaping Use: Never used  Substance  Use Topics   Alcohol use: Not Currently   Drug use: No     Allergies   Iodine   Review of Systems Review of Systems  Constitutional:  Positive for chills, fatigue and fever.  HENT:  Positive for congestion and sore throat.   Respiratory:  Positive for cough and shortness of breath. Negative for chest tightness.   Cardiovascular:  Negative for chest pain.  Gastrointestinal:  Negative for diarrhea, nausea and vomiting.  All other systems reviewed and are negative.   Physical Exam Triage Vital Signs ED Triage Vitals  Enc Vitals Group     BP 05/20/21 1651 123/61     Pulse Rate 05/20/21 1651 100     Resp 05/20/21 1651 20     Temp 05/20/21 1651 99 F (37.2 C)     Temp Source 05/20/21 1651 Oral     SpO2 05/20/21 1651 100 %     Weight --      Height --      Head Circumference --      Peak Flow --      Pain Score 05/20/21 1649 8     Pain Loc --      Pain Edu? --      Excl. in GC? --    No data found.  Updated Vital Signs BP 123/61 (BP Location: Right Arm)   Pulse 100   Temp 99 F (37.2 C) (Oral)   Resp 20   SpO2 100%   Visual Acuity Right Eye Distance:   Left Eye Distance:   Bilateral Distance:    Right Eye Near:   Left Eye Near:    Bilateral Near:     Physical Exam Vitals and nursing note reviewed.  Constitutional:      General: She is not in acute distress.    Appearance: Normal appearance. She is not ill-appearing, toxic-appearing or diaphoretic.  HENT:     Head: Normocephalic and atraumatic.     Nose: Congestion present.     Mouth/Throat:     Mouth: Mucous membranes are moist.     Pharynx: Oropharynx is clear. Posterior oropharyngeal erythema present. No oropharyngeal exudate.  Eyes:     Conjunctiva/sclera: Conjunctivae normal.  Cardiovascular:     Rate and Rhythm: Normal rate and regular rhythm.     Pulses: Normal pulses.     Heart sounds: Normal heart sounds.  Pulmonary:     Effort: Pulmonary effort is normal.     Breath sounds: Normal breath  sounds.  Abdominal:     General: Abdomen is flat.  Musculoskeletal:        General: Normal range of motion.     Cervical back: Normal range of  motion.  Skin:    General: Skin is warm and dry.  Neurological:     General: No focal deficit present.     Mental Status: She is alert and oriented to person, place, and time.  Psychiatric:        Mood and Affect: Mood normal.     UC Treatments / Results  Labs (all labs ordered are listed, but only abnormal results are displayed) Labs Reviewed  SARS CORONAVIRUS 2 (TAT 6-24 HRS)    EKG   Radiology No results found.  Procedures Procedures (including critical care time)  Medications Ordered in UC Medications - No data to display  Initial Impression / Assessment and Plan / UC Course  I have reviewed the triage vital signs and the nursing notes.  Pertinent labs & imaging results that were available during my care of the patient were reviewed by me and considered in my medical decision making (see chart for details).    Assessment negative for red flags or concerns.  Likely viral URI with cough.  COVID test pending.  As symptoms started on Saturday, patient will not be a candidate for antiviral treatment if test is positive.  Discussed conservative symptom management.  Follow up with primary care as soon as possible for re-evaluation.  Strict ED follow up for any red flag symptoms.   Final Clinical Impressions(s) / UC Diagnoses   Final diagnoses:  Viral URI with cough     Discharge Instructions      We will contact you if your COVID test is positive.  It will also show up in your MyChart.    You can take Tylenol and/or Ibuprofen as needed for fever reduction and pain relief.   For cough: honey 1/2 to 1 teaspoon (you can dilute the honey in water or another fluid).  You can also use guaifenesin and dextromethorphan for cough. You can use a humidifier for chest congestion and cough.  If you don't have a humidifier, you can sit  in the bathroom with the hot shower running.     For sore throat: try warm salt water gargles, cepacol lozenges, throat spray, warm tea or water with lemon/honey, popsicles or ice, or OTC cold relief medicine for throat discomfort.    For congestion: take a daily anti-histamine like Zyrtec, Claritin, and a oral decongestant, such as pseudoephedrine.  You can also use Flonase 1-2 sprays in each nostril daily.    It is important to stay hydrated: drink plenty of fluids (water, gatorade/powerade/pedialyte, juices, or teas) to keep your throat moisturized and help further relieve irritation/discomfort.   Return or go to the Emergency Department if symptoms worsen or do not improve in the next few days.      ED Prescriptions   None    PDMP not reviewed this encounter.   Ivette Loyal, NP 05/20/21 1729

## 2021-05-20 NOTE — Discharge Instructions (Addendum)
We will contact you if your COVID test is positive.  It will also show up in your MyChart.    You can take Tylenol and/or Ibuprofen as needed for fever reduction and pain relief.   For cough: honey 1/2 to 1 teaspoon (you can dilute the honey in water or another fluid).  You can also use guaifenesin and dextromethorphan for cough. You can use a humidifier for chest congestion and cough.  If you don't have a humidifier, you can sit in the bathroom with the hot shower running.     For sore throat: try warm salt water gargles, cepacol lozenges, throat spray, warm tea or water with lemon/honey, popsicles or ice, or OTC cold relief medicine for throat discomfort.    For congestion: take a daily anti-histamine like Zyrtec, Claritin, and a oral decongestant, such as pseudoephedrine.  You can also use Flonase 1-2 sprays in each nostril daily.    It is important to stay hydrated: drink plenty of fluids (water, gatorade/powerade/pedialyte, juices, or teas) to keep your throat moisturized and help further relieve irritation/discomfort.   Return or go to the Emergency Department if symptoms worsen or do not improve in the next few days.

## 2021-05-20 NOTE — Telephone Encounter (Signed)
Pt scheduled video visit 05/20/2021

## 2021-05-21 ENCOUNTER — Encounter: Payer: Self-pay | Admitting: Family Medicine

## 2021-05-21 ENCOUNTER — Ambulatory Visit: Payer: BC Managed Care – PPO | Admitting: Nurse Practitioner

## 2021-05-21 ENCOUNTER — Encounter: Payer: Self-pay | Admitting: Nurse Practitioner

## 2021-05-21 LAB — SARS CORONAVIRUS 2 (TAT 6-24 HRS): SARS Coronavirus 2: POSITIVE — AB

## 2021-05-21 NOTE — Telephone Encounter (Signed)
Pt called, concerned that her covid test was positive... I see she has a msg to [place] and Dr Doreene Burke saying the same. Just putting in the msg that she called too

## 2021-05-25 ENCOUNTER — Inpatient Hospital Stay (HOSPITAL_COMMUNITY)
Admission: RE | Admit: 2021-05-25 | Discharge: 2021-05-25 | Disposition: A | Discharge status: Home / Self Care | Payer: BC Managed Care – PPO | Source: Ambulatory Visit

## 2021-05-25 ENCOUNTER — Telehealth (INDEPENDENT_AMBULATORY_CARE_PROVIDER_SITE_OTHER): Payer: BC Managed Care – PPO | Admitting: Nurse Practitioner

## 2021-05-25 ENCOUNTER — Encounter: Payer: Self-pay | Admitting: Nurse Practitioner

## 2021-05-25 ENCOUNTER — Telehealth (HOSPITAL_COMMUNITY): Payer: Self-pay | Admitting: *Deleted

## 2021-05-25 ENCOUNTER — Telehealth: Payer: Self-pay | Admitting: Pulmonary Disease

## 2021-05-25 VITALS — Temp 97.8°F | Ht 60.0 in | Wt 144.0 lb

## 2021-05-25 DIAGNOSIS — U071 COVID-19: Secondary | ICD-10-CM | POA: Diagnosis not present

## 2021-05-25 DIAGNOSIS — R0981 Nasal congestion: Secondary | ICD-10-CM

## 2021-05-25 MED ORDER — FLUTICASONE PROPIONATE 50 MCG/ACT NA SUSP: 2.0000 | Freq: Every day | NASAL | 1 refills | Status: DC

## 2021-05-25 MED ORDER — CETIRIZINE HCL 10 MG PO TABS: 10.0000 mg | ORAL_TABLET | Freq: Every day | ORAL | 0 refills | Status: DC

## 2021-05-25 NOTE — Telephone Encounter (Signed)
I called and spoke with pt and she stated that she is not really having any other symptoms of covid now.  She is feeling some better.  She stated that she was discharged from [facility] and she just wanted to let PM know that.  Nothing further is needed.  FYI for PM.

## 2021-05-25 NOTE — Progress Notes (Signed)
Virtual Visit via Video Note  I connected withNAME@ on 05/25/21 at 10:30 AM EDT by a video enabled telemedicine application and verified that I am speaking with the correct person using two identifiers.  Location: Patient:Home Provider: Office Participants: patient and provider  I discussed the limitations of evaluation and management by telemedicine and the availability of in person appointments. I also discussed with the patient that there may be a patient responsible charge related to this service. The patient expressed understanding and agreed to proceed.  MV:[facility] covid on 05/20/2020  History of Present Illness: Ms. Filosa was diagnosed with COVID on 05/20/2021. No monoclonal nor antiviral used. She has completed 1 dose of janssen vaccine. Due to pulmonary fibrosis she has chronic dyspnea, but this has not worsened with recent viral infection. She denies an night sweats or chest pain or palpitations or syncope. She is concerned about persistent sinus congestion with clear mucus, no headache, no teeth sensitivity, no dizziness. She has minimal improvement with OTS cold and sinus medication   Observations/Objective: Physical Exam Constitutional:      General: She is not in acute distress. Pulmonary:     Effort: Pulmonary effort is normal.  Neurological:     Mental Status: She is alert and oriented to person, place, and time.   Assessment and Plan: Kala was seen today for acute visit.  Diagnoses and all orders for this visit:  COVID-19 -     fluticasone (FLONASE) 50 MCG/ACT nasal spray; Place 2 sprays into both nostrils daily. -     cetirizine (ZYRTEC) 10 MG tablet; Take 1 tablet (10 mg total) by mouth at bedtime.  Sinus congestion -     fluticasone (FLONASE) 50 MCG/ACT nasal spray; Place 2 sprays into both nostrils daily. -     cetirizine (ZYRTEC) 10 MG tablet; Take 1 tablet (10 mg total) by mouth at bedtime.   Follow Up Instructions: Stop oral decongestant to  prevent rebound HTN. Use oral antihistamine, flonase and saline sinus rinse as discussed.   I discussed the assessment and treatment plan with the patient. The patient was provided an opportunity to ask questions and all were answered. The patient agreed with the plan and demonstrated an understanding of the instructions.   The patient was advised to call back or seek an in-person evaluation if the symptoms worsen or if the condition fails to improve as anticipated.  Alysia Penna, NP

## 2021-05-25 NOTE — Telephone Encounter (Signed)
Pt stated that she tested positive for Covid-19 on May 20, 2021 and she stated that she was supposed to graduate from pulmonary rehab today but they discharge her. Symptoms; stated that she went to urgent care last Thursday and today is her last day of isolation. Denies having any symptoms states that they are all gone, just wanted to inform Dr. Isaiah Serge.   Pls regard; [phone]

## 2021-05-31 ENCOUNTER — Telehealth: Payer: Self-pay | Admitting: Pulmonary Disease

## 2021-05-31 NOTE — Telephone Encounter (Signed)
Called let patient know she had to wait 10 days after testing positive. That was 05/30/21 so patient will come in this week for her blood work  Nothing further needed at this time.Marland Kitchen

## 2021-06-01 ENCOUNTER — Other Ambulatory Visit (INDEPENDENT_AMBULATORY_CARE_PROVIDER_SITE_OTHER): Payer: BC Managed Care – PPO

## 2021-06-01 ENCOUNTER — Telehealth: Payer: Self-pay | Admitting: Pulmonary Disease

## 2021-06-01 DIAGNOSIS — Z5181 Encounter for therapeutic drug level monitoring: Secondary | ICD-10-CM | POA: Diagnosis not present

## 2021-06-01 LAB — HEPATIC FUNCTION PANEL
ALT: 22 U/L (ref 0–35)
AST: 36 U/L (ref 0–37)
Albumin: 4.2 g/dL (ref 3.5–5.2)
Alkaline Phosphatase: 58 U/L (ref 39–117)
Bilirubin, Direct: 0.1 mg/dL (ref 0.0–0.3)
Total Bilirubin: 0.3 mg/dL (ref 0.2–1.2)
Total Protein: 8.2 g/dL (ref 6.0–8.3)

## 2021-06-01 NOTE — Telephone Encounter (Signed)
Patient dropped off documentation for Respiratory Report and Physician Statement for [employer] - emailed forms to [place].

## 2021-06-03 ENCOUNTER — Other Ambulatory Visit: Payer: Self-pay | Admitting: Pulmonary Disease

## 2021-06-03 DIAGNOSIS — J84112 Idiopathic pulmonary fibrosis: Secondary | ICD-10-CM

## 2021-06-04 ENCOUNTER — Encounter: Payer: Self-pay | Admitting: *Deleted

## 2021-06-04 ENCOUNTER — Telehealth: Payer: Self-pay | Admitting: Pulmonary Disease

## 2021-06-04 NOTE — Telephone Encounter (Signed)
Refill for Ofev 100mg  twice daily sent to [facility]. Medication was removed from patient's active med list.  [facility] was last filled on 05/10/21  Patient does not have f/u appointment with Dr. 07/10/21 scheduled yet. Last LFTs drawn on 8/23/2 were wnl.  Last seen 02/22/21  02/24/21, PharmD, MPH, BCPS Clinical Pharmacist (Rheumatology and Pulmonology)

## 2021-06-04 NOTE — Telephone Encounter (Signed)
I called and spoke with patient regarding message. I went through chart and could not see where someone had call. I saw some forms have been faxed to [facility] so I informed her that it could have been her. Patient verbalized understanding and nothing further needed.

## 2021-06-07 ENCOUNTER — Telehealth: Payer: Self-pay | Admitting: Pulmonary Disease

## 2021-06-07 NOTE — Telephone Encounter (Signed)
Ok to make an earlier visit with me if I have an opening

## 2021-06-07 NOTE — Telephone Encounter (Signed)
Called spoke with patient.  She is scheduled with him on 07/13/21. Told patient to call us if she needed anything sooner.

## 2021-06-07 NOTE — Telephone Encounter (Signed)
Called spoke with patient She said she has been on Ofev since March, the tiredness and shortness of breath is not new. She had covid on 05/20/21 and has been in quarantine since.  Patient did not give specifics for what she needed. Looks like recall has been placed for an appointment with Dr. Isaiah Serge in November.    Will route to Dr. Isaiah Serge in case he wants to see patient sooner.

## 2021-06-10 NOTE — Progress Notes (Signed)
Discharge Progress Report  Patient Details  Name: Sandra Curtis MRN: [id number] Date of Birth: May 06, 1956 Referring Provider:   Doristine DevoidFlowsheet Row Pulmonary Rehab Walk [facility]  Referring Provider Dr. Isaiah SergeMannam        Number of Visits: 16  Reason for Discharge:  Early Exit:  Sandra SpryGail did not completely finish the program due to having covid-19 at the end of the program, she did reach independence with her exercise though.  Smoking History:  Social History   Tobacco Use  Smoking Status Former   Packs/day: 0.50   Years: 35.00   Pack years: 17.50   Types: Cigarettes   Quit date: 04/12/2018   Years since quitting: 3.1  Smokeless Tobacco Never    Diagnosis:  Pulmonary fibrosis (HCC)  ADL UCSD:  Pulmonary Assessment Scores     Row Name 03/15/21 1200 05/31/21 1419       ADL UCSD   ADL Phase Entry Exit    SOB Score total 82 70         CAT Score   CAT Score 30 25         mMRC Score   mMRC Score 4 --             Initial Exercise Prescription:  Initial Exercise Prescription - 03/15/21 1200       Date of Initial Exercise RX and Referring Provider   Date 03/15/21    Referring Provider Dr. Isaiah SergeMannam    Expected Discharge Date 05/20/21      NuStep   Level 1    SPM 80    Minutes 15      Track   Minutes 15      Prescription Details   Frequency (times per week) 2    Duration Progress to 30 minutes of continuous aerobic without signs/symptoms of physical distress      Intensity   THRR 40-80% of Max Heartrate 62-124    Ratings of Perceived Exertion 11-13    Perceived Dyspnea 0-4      Progression   Progression Continue to progress workloads to maintain intensity without signs/symptoms of physical distress.      Resistance Training   Training Prescription Yes    Weight Red bands    Reps 10-15             Discharge Exercise Prescription (Final Exercise Prescription Changes):  Exercise  Prescription Changes - 05/18/21 1500       Response to Exercise   Blood Pressure (Admit) 122/68    Blood Pressure (Exercise) 120/62    Blood Pressure (Exit) 100/60    Heart Rate (Admit) 105 bpm    Heart Rate (Exercise) 104 bpm    Heart Rate (Exit) 98 bpm    Oxygen Saturation (Admit) 95 %    Oxygen Saturation (Exercise) 95 %    Oxygen Saturation (Exit) 97 %    Rating of Perceived Exertion (Exercise) 11    Perceived Dyspnea (Exercise) 1    Duration Continue with 30 min of aerobic exercise without signs/symptoms of physical distress.    Intensity THRR unchanged      Progression   Progression Continue to progress workloads to maintain intensity without signs/symptoms of physical distress.      Resistance Training   Training Prescription Yes    Weight Red bands    Reps 10-15    Time 10 Minutes      Treadmill   MPH 1.9  Grade 1    Minutes 15      NuStep   Level 5    SPM 80    Minutes 15    METs 2.1             Functional Capacity:  6 Minute Walk     Row Name 03/15/21 1155         6 Minute Walk   Phase Initial     Distance 1130 feet     Walk Time 6 minutes     # of Rest Breaks 1  I standing rest break for 1 minute     MPH 2.14     METS 3.26     RPE 13     Perceived Dyspnea  2     VO2 Peak 11.4     Symptoms No     Resting HR 66 bpm     Resting BP 148/70     Resting Oxygen Saturation  97 %     Exercise Oxygen Saturation  during 6 min walk 92 %     Max Ex. HR 128 bpm     Max Ex. BP 152/72     2 Minute Post BP 142/66           Interval HR   1 Minute HR 100     2 Minute HR 117     3 Minute HR 107     4 Minute HR 114     5 Minute HR 121     6 Minute HR 128     2 Minute Post HR 84     Interval Heart Rate? Yes           Interval Oxygen   Interval Oxygen? Yes     Baseline Oxygen Saturation % 97 %     1 Minute Oxygen Saturation % 98 %     1 Minute Liters of Oxygen 0 L     2 Minute Oxygen Saturation % 92 %     2 Minute Liters of Oxygen 0 L      3 Minute Oxygen Saturation % 97 %     3 Minute Liters of Oxygen 0 L     4 Minute Oxygen Saturation % 97 %     4 Minute Liters of Oxygen 0 L     5 Minute Oxygen Saturation % 93 %     5 Minute Liters of Oxygen 0 L     6 Minute Oxygen Saturation % 93 %     6 Minute Liters of Oxygen 0 L     2 Minute Post Oxygen Saturation % 99 %     2 Minute Post Liters of Oxygen 0 L              Psychological, QOL, Others - Outcomes: PHQ 2/9: Depression screen [facility] 2/9 05/20/2021 04/30/2021 03/15/2021 01/12/2021 11/17/2020  Decreased Interest 0 0 3 0 3  Down, Depressed, Hopeless 0 1 1 0 1  PHQ - 2 Score 0 1 4 0 4  Altered sleeping - - 3 3 3   Tired, decreased energy - - 1 0 3  Change in appetite - - 1 1 3   Feeling bad or failure about yourself  - - 0 0 0  Trouble concentrating - - 0 0 0  Moving slowly or fidgety/restless - - 0 0 3  Suicidal thoughts - - 0 0 0  PHQ-9 Score - - 9 4 16   Difficult doing  work/chores - - Somewhat difficult Not difficult at all Somewhat difficult    Quality of Life:   Personal Goals: Goals established at orientation with interventions provided to work toward goal.  Personal Goals and Risk Factors at Admission - 03/15/21 1150       Core Components/Risk Factors/Patient Goals on Admission    Weight Management Weight Loss    Improve shortness of breath with ADL's Yes    Intervention Provide education, individualized exercise plan and daily activity instruction to help decrease symptoms of SOB with activities of daily living.    Expected Outcomes Short Term: Improve cardiorespiratory fitness to achieve a reduction of symptoms when performing ADLs;Long Term: Be able to perform more ADLs without symptoms or delay the onset of symptoms    Lipids Yes    Intervention Provide education and support for participant on nutrition & aerobic/resistive exercise along with prescribed medications to achieve LDL 70mg , HDL >40mg .    Expected Outcomes Short Term: Participant states  understanding of desired cholesterol values and is compliant with medications prescribed. Participant is following exercise prescription and nutrition guidelines.;Long Term: Cholesterol controlled with medications as prescribed, with individualized exercise RX and with personalized nutrition plan. Value goals: LDL < 70mg , HDL > 40 mg.              Personal Goals Discharge:  Goals and Risk Factor Review     Row Name 03/29/21 1004 04/26/21 1325 05/24/21 1353         Core Components/Risk Factors/Patient Goals Review   Personal Goals Review Develop more efficient breathing techniques such as purse lipped breathing and diaphragmatic breathing and practicing self-pacing with activity.;Increase knowledge of respiratory medications and ability to use respiratory devices properly.;Improve shortness of breath with ADL's Develop more efficient breathing techniques such as purse lipped breathing and diaphragmatic breathing and practicing self-pacing with activity.;Increase knowledge of respiratory medications and ability to use respiratory devices properly.;Improve shortness of breath with ADL's Develop more efficient breathing techniques such as purse lipped breathing and diaphragmatic breathing and practicing self-pacing with activity.;Increase knowledge of respiratory medications and ability to use respiratory devices properly.;Improve shortness of breath with ADL's     Review 05/26/21 just started pulmonary rehab, has attended 2 exercise sessions, too early to see progression towards goals at this time. Sandra Curtis is learning how to exercise safely for a patient with pulmonary fibrosis.  She is increasing her speed and incline on the treadmill and has actually started using her treadmill at home which is a MAJOR goal.  So proud of her!!! Sandra Curtis has made great progress with her exercise.  She is using her treadmill at home that has not been used in quite sometime.  She just tested positive for COVID-19 last week.  She  was to graduate 05/25/21 and will be required to quarenting for 11 days.     Expected Outcomes See admission goals. See admission goals. See admission goals.              Exercise Goals and Review:  Exercise Goals     Row Name 03/15/21 1143             Exercise Goals   Increase Physical Activity Yes       Intervention Provide advice, education, support and counseling about physical activity/exercise needs.;Develop an individualized exercise prescription for aerobic and resistive training based on initial evaluation findings, risk stratification, comorbidities and participant's personal goals.       Expected Outcomes Short Term: Attend rehab on a  regular basis to increase amount of physical activity.;Long Term: Add in home exercise to make exercise part of routine and to increase amount of physical activity.;Long Term: Exercising regularly at least 3-5 days a week.       Increase Strength and Stamina Yes       Intervention Provide advice, education, support and counseling about physical activity/exercise needs.;Develop an individualized exercise prescription for aerobic and resistive training based on initial evaluation findings, risk stratification, comorbidities and participant's personal goals.       Expected Outcomes Short Term: Increase workloads from initial exercise prescription for resistance, speed, and METs.;Short Term: Perform resistance training exercises routinely during rehab and add in resistance training at home;Long Term: Improve cardiorespiratory fitness, muscular endurance and strength as measured by increased METs and functional capacity ( )       Able to understand and use rate of perceived exertion (RPE) scale Yes       Intervention Provide education and explanation on how to use RPE scale       Expected Outcomes Short Term: Able to use RPE daily in rehab to express subjective intensity level;Long Term:  Able to use RPE to guide intensity level when exercising  independently       Able to understand and use Dyspnea scale Yes       Intervention Provide education and explanation on how to use Dyspnea scale       Expected Outcomes Short Term: Able to use Dyspnea scale daily in rehab to express subjective sense of shortness of breath during exertion;Long Term: Able to use Dyspnea scale to guide intensity level when exercising independently       Knowledge and understanding of Target Heart Rate Range (THRR) Yes       Intervention Provide education and explanation of THRR including how the numbers were predicted and where they are located for reference       Expected Outcomes Short Term: Able to state/look up THRR;Long Term: Able to use THRR to govern intensity when exercising independently;Short Term: Able to use daily as guideline for intensity in rehab       Understanding of Exercise Prescription Yes       Intervention Provide education, explanation, and written materials on patient's individual exercise prescription       Expected Outcomes Short Term: Able to explain program exercise prescription;Long Term: Able to explain home exercise prescription to exercise independently                Exercise Goals Re-Evaluation:  Exercise Goals Re-Evaluation     Row Name 03/29/21 1110 04/26/21 1557           Exercise Goal Re-Evaluation   Exercise Goals Review Increase Physical Activity;Increase Strength and Stamina;Able to understand and use rate of perceived exertion (RPE) scale;Able to understand and use Dyspnea scale;Knowledge and understanding of Target Heart Rate Range (THRR);Understanding of Exercise Prescription Increase Physical Activity;Increase Strength and Stamina;Able to understand and use rate of perceived exertion (RPE) scale;Able to understand and use Dyspnea scale;Knowledge and understanding of Target Heart Rate Range (THRR);Understanding of Exercise Prescription      Comments Sandra Curtis has completed 2 exercise sessions and has tolerated well so  far with no concerns. It it too early to note any progression. She is exercising at 1.9 METS on the Nustep and 2.28 METS on the track. Will continue to monitor and progress as she is able. Sandra Curtis has completed 10 exercise sessions and has been consistent with workload increases. She has  progressed from walking the track to walking on the treadmill and has tolerated that well. She is exercising at 2.2 METS on the Nustep and 2.7 METS on the treadmill. Will continue to monitor and progress as she is able.      Expected Outcomes Through exercise at rehab and home, the patient will decrease shortness of breath with daily activities and feel confident in carrying out an exercise regimn at home. Through exercise at rehab and home, the patient will decrease shortness of breath with daily activities and feel confident in carrying out an exercise regimn at home.               Nutrition & Weight - Outcomes:  Pre Biometrics - 03/15/21 1144       Pre Biometrics   Grip Strength 27 kg              Nutrition:  Nutrition Therapy & Goals - 04/26/21 1410       Nutrition Therapy   Diet General Healthful      Personal Nutrition Goals   Nutrition Goal Pt to build a healthy plate including vegetables, fruits, whole grains, and low-fat dairy products in a heart healthy meal plan.    Personal Goal #2 Pt to reduce refined carbohydrates      Intervention Plan   Intervention Prescribe, educate and counsel regarding individualized specific dietary modifications aiming towards targeted core components such as weight, hypertension, lipid management, diabetes, heart failure and other comorbidities.;Nutrition handout(s) given to patient.    Expected Outcomes Long Term Goal: Adherence to prescribed nutrition plan.;Short Term Goal: Understand basic principles of dietary content, such as calories, fat, sodium, cholesterol and nutrients.             Nutrition Discharge:   Education Questionnaire Score:   Knowledge Questionnaire Score - 05/31/21 1416       Knowledge Questionnaire Score   Post Score 17/18             Goals reviewed with patient; copy given to patient.

## 2021-06-11 NOTE — Telephone Encounter (Signed)
Disability documents - Attending Physician Report and Respiratory Report - were signed by Dr. Isaiah Serge and faxed to [employer] fax# [phone].

## 2021-06-23 ENCOUNTER — Telehealth: Payer: Self-pay | Admitting: Pulmonary Disease

## 2021-06-23 NOTE — Telephone Encounter (Signed)
Patient called to say disability documents were not received by her insurance.  I faxed them again to [employer] fax# [phone].  Also called patient to let her know and then mailed her a hard copy of the signed form.

## 2021-06-29 ENCOUNTER — Encounter: Payer: Self-pay | Admitting: Orthopaedic Surgery

## 2021-06-29 ENCOUNTER — Other Ambulatory Visit: Payer: Self-pay

## 2021-06-29 ENCOUNTER — Ambulatory Visit (INDEPENDENT_AMBULATORY_CARE_PROVIDER_SITE_OTHER): Payer: BC Managed Care – PPO | Admitting: Orthopaedic Surgery

## 2021-06-29 ENCOUNTER — Other Ambulatory Visit: Payer: Self-pay | Admitting: Gastroenterology

## 2021-06-29 ENCOUNTER — Ambulatory Visit: Payer: Self-pay

## 2021-06-29 VITALS — BP 128/76 | HR 76 | Ht 60.0 in | Wt 140.0 lb

## 2021-06-29 DIAGNOSIS — M545 Low back pain, unspecified: Secondary | ICD-10-CM

## 2021-06-29 NOTE — Progress Notes (Signed)
Office Visit Note   Patient: Sandra Curtis           Date of Birth: 07-12-1956           MRN: [id number] Visit Date: 06/29/2021              Requested by: [name], NP [address] [place],  [place] [address] PCP: [name], NP   Assessment & Plan: Visit Diagnoses:  1. Acute midline low back pain, unspecified whether sciatica present     Plan: X-rays show no change in old L1 compression fracture no new injuries.  Patient does have scoliosis and previous scan showed some narrowing at L4-5 where she has 3 mm anterolisthesis.  She is not having any neurogenic claudication symptoms currently.  We will set her up for some physical therapy for core strengthening.  Recheck 6 weeks.  Follow-Up Instructions: No follow-ups on file.   Orders:  Orders Placed This Encounter  Procedures   XR Lumbar Spine 2-3 Views   No orders of the defined types were placed in this encounter.     Procedures: No procedures performed   Clinical Data: No additional findings.   Subjective: Chief Complaint  Patient presents with   Lower Back - Pain    HPI 65 year old female returns she states she been out of work in February has applied and has been granted disability.  States 3 weeks ago her lower back started bothering her worse.  She has had problems going downstairs as well as up with throbbing and aching.  She has trouble if she sits for long period of time.  Shorter time sitting is tolerated better.  Pain occasionally radiates into her left leg.  She had recent COVID and states she is gotten over it.  She is used Tylenol and Aspercreme.  No associated bowel or bladder symptoms.  Past history of facet arthropathy L4-5 3 mm anterolisthesis.  6 L1 superior endplate fracture with 30% compression.  Foraminal narrowing worse on the left than right at L5-S1.  Review of Systems all other systems noncontributory to HPI.   Objective: Vital Signs: BP 128/76   Pulse  76   Ht 5' (1.524 m)   Wt 140 lb (63.5 kg)   BMI 27.34 kg/m   Physical Exam Constitutional:      Appearance: She is well-developed.  HENT:     Head: Normocephalic.     Right Ear: External ear normal.     Left Ear: External ear normal. There is no impacted cerumen.  Eyes:     Pupils: Pupils are equal, round, and reactive to light.  Neck:     Thyroid: No thyromegaly.     Trachea: No tracheal deviation.  Cardiovascular:     Rate and Rhythm: Normal rate.  Pulmonary:     Effort: Pulmonary effort is normal.  Abdominal:     Palpations: Abdomen is soft.  Musculoskeletal:     Cervical back: No rigidity.  Skin:    General: Skin is warm and dry.  Neurological:     Mental Status: She is alert and oriented to person, place, and time.  Psychiatric:        Behavior: Behavior normal.    Ortho Exam patient has negative straight leg raising.  She is able to heel and toe walk.  Tenderness of the lumbosacral junction paralumbar muscles.  Negative logroll hips knee and ankle jerk are intact.  EHL anterior tib is strong.  Specialty Comments:  No specialty comments available.  Imaging: No results found.   PMFS History: Patient Active Problem List   Diagnosis Date Noted   Viral syndrome 05/20/2021   Atherosclerosis of aorta (HCC) 01/12/2021   Primary insomnia 01/12/2021   Positive ANA (antinuclear antibody) 09/09/2020   Pulmonary fibrosis (HCC) 06/26/2020   Chronic cough 05/13/2020   Atrophic vaginitis 01/09/2019   Menopausal flushing 01/09/2019   Colon polyp, hyperplastic 01/09/2019   Type 2 diabetes mellitus without complication, without long-term current use of insulin (HCC) 12/11/2017   Normocytic anemia 12/11/2017   Depressive disorder 02/06/2017   Gastroesophageal reflux disease with esophagitis 02/06/2017   DDD (degenerative disc disease), lumbosacral 02/06/2017   Dysphagia 02/06/2017   Scoliosis of lumbar spine 02/06/2017   Aortic regurgitation 10/02/2013   Headache  03/01/2012   Seasonal allergic rhinitis 03/01/2012   Past Medical History:  Diagnosis Date   Allergic rhinitis    Allergy    Chronic back pain 07/20/2018   Chronic midline low back pain 02/06/2017   Depression    Esophageal stricture    GERD (gastroesophageal reflux disease)    Inflammatory polyps of colon (HCC) 2010   Pulmonary filariasis    Systolic murmur    UTI (urinary tract infection)     Family History  Problem Relation Age of Onset   Arthritis Mother    Thyroid disease Mother    Heart disease Father    Hypertension Father    Diabetes Father    Arthritis Sister    Diabetes Brother    Colon cancer Neg Hx    Stomach cancer Neg Hx    Rectal cancer Neg Hx    Esophageal cancer Neg Hx     Past Surgical History:  Procedure Laterality Date   COLONOSCOPY     UPPER GASTROINTESTINAL ENDOSCOPY     Social History   Occupational History   Occupation: Biomedical engineer: paperworks  Tobacco Use   Smoking status: Former    Packs/day: 0.50    Years: 35.00    Pack years: 17.50    Types: Cigarettes    Quit date: 04/12/2018    Years since quitting: 3.2   Smokeless tobacco: Never  Vaping Use   Vaping Use: Never used  Substance and Sexual Activity   Alcohol use: Not Currently   Drug use: No   Sexual activity: Not Currently

## 2021-07-07 ENCOUNTER — Other Ambulatory Visit: Payer: Self-pay | Admitting: Obstetrics

## 2021-07-07 DIAGNOSIS — R928 Other abnormal and inconclusive findings on diagnostic imaging of breast: Secondary | ICD-10-CM

## 2021-07-08 ENCOUNTER — Encounter: Payer: Self-pay | Admitting: Rehabilitative and Restorative Service Providers"

## 2021-07-08 ENCOUNTER — Ambulatory Visit (INDEPENDENT_AMBULATORY_CARE_PROVIDER_SITE_OTHER): Payer: BC Managed Care – PPO | Admitting: Rehabilitative and Restorative Service Providers"

## 2021-07-08 ENCOUNTER — Other Ambulatory Visit: Payer: Self-pay

## 2021-07-08 DIAGNOSIS — M6281 Muscle weakness (generalized): Secondary | ICD-10-CM | POA: Diagnosis not present

## 2021-07-08 DIAGNOSIS — M545 Low back pain, unspecified: Secondary | ICD-10-CM

## 2021-07-08 DIAGNOSIS — R262 Difficulty in walking, not elsewhere classified: Secondary | ICD-10-CM

## 2021-07-08 DIAGNOSIS — R293 Abnormal posture: Secondary | ICD-10-CM | POA: Diagnosis not present

## 2021-07-08 DIAGNOSIS — G8929 Other chronic pain: Secondary | ICD-10-CM

## 2021-07-08 NOTE — Therapy (Signed)
[facility] [address] [place], [place], [address] Phone: [phone]   Fax:  [phone]  Physical Therapy Evaluation  Patient Details  Name: Sandra Curtis MRN: [id number] Date of Birth: 11/04/1955 Referring Provider (PT): Eldred Manges MD   Encounter Date: 07/08/2021   PT End of Session - 07/08/21 1641     Visit Number 1    Number of Visits 16    Date for PT Re-Evaluation 09/02/21    PT Start Time 1430    PT Stop Time 1515    PT Time Calculation (min) 45 min    Activity Tolerance No increased pain;Patient tolerated treatment well    Behavior During Therapy [facility] for tasks assessed/performed             Past Medical History:  Diagnosis Date   Allergic rhinitis    Allergy    Chronic back pain 07/20/2018   Chronic midline low back pain 02/06/2017   Depression    Esophageal stricture    GERD (gastroesophageal reflux disease)    Inflammatory polyps of colon (HCC) 2010   Pulmonary filariasis    Systolic murmur    UTI (urinary tract infection)     Past Surgical History:  Procedure Laterality Date   COLONOSCOPY     UPPER GASTROINTESTINAL ENDOSCOPY      There were no vitals filed for this visit.    Subjective Assessment - 07/08/21 1632     Subjective Rozina has a history of chronic low back pain.  She has had sciatica in the past but not with this episode.  Bending and prolonged sitting are mentioned as particularly limiting.    Pertinent History Pulmonary fibrosis, type 2 DM, lumbosacral DDD, lumbar scoliosis    Limitations Sitting;House hold activities;Lifting    How long can you sit comfortably? 45 minutes    How long can you stand comfortably? Depends on what she is doing (up to an hour)    How long can you walk comfortably? Due to breathing difficulties she does not walk for exercise    Patient Stated Goals Decrease low back pain    Currently in Pain? Yes    Pain Score 5     Pain Location Back    Pain  Orientation Lower    Pain Descriptors / Indicators Aching;Sore    Pain Type Chronic pain    Pain Radiating Towards Has had sciatica in the past, not with this episode    Pain Onset More than a month ago    Pain Frequency Constant    Aggravating Factors  Flexion and prolonged sitting    Pain Relieving Factors Change of position    Effect of Pain on Daily Activities Limited general conditioning and can't sit comfortable more than 45 minutes    Multiple Pain Sites No                [facility] PT Assessment - 07/08/21 0001       Assessment   Medical Diagnosis Low back pain    Referring Provider (PT) Eldred Manges MD    Onset Date/Surgical Date --   Years   Next MD Visit 6 months      Precautions   Precautions Back    Precaution Booklet Issued No    Precaution Comments Avoid flexion (with rotation), prolonged postures      Restrictions   Weight Bearing Restrictions No    Other Position/Activity Restrictions NO      Balance Screen  Has the patient fallen in the past 6 months No    Has the patient had a decrease in activity level because of a fear of falling?  No    Is the patient reluctant to leave their home because of a fear of falling?  No      Home [employer] information [employer] manager residence    Living Arrangements Alone    Additional Comments 2nd floor appartment      Prior Function   Level of Independence Independent    Vocation On disability    Leisure TV      Cognition   Overall Cognitive Status Within Functional Limits for tasks assessed      Observation/Other Assessments   Focus on Therapeutic Outcomes (FOTO)  31 (Goal 55)      ROM / Strength   AROM / PROM / Strength AROM      AROM   Overall AROM  Deficits    AROM Assessment Site Hip;Lumbar    Right/Left Hip Left;Right    Right Hip Extension 0    Right Hip Flexion 105    Right Hip External Rotation  30    Right Hip Internal Rotation  10    Left Hip Flexion 100    Left Hip External Rotation  25     Left Hip Internal Rotation  20    Lumbar Extension 0      Flexibility   Soft Tissue Assessment /Muscle Length yes    Hamstrings 50 degrees B                        Objective measurements completed on examination: See above findings.       [facility] Adult PT Treatment/Exercise - 07/08/21 0001       Posture/Postural Control   Posture/Postural Control Postural limitations    Postural Limitations Flexed trunk      Therapeutic Activites    Therapeutic Activities ADL's;Lifting    ADL's Kitchen sink, getting things out of cabinets and washer/dryer    Lifting Golfer's lift      Exercises   Exercises Lumbar      Lumbar Exercises: Stretches   Single Knee to Chest Stretch Left;Right;4 reps;20 seconds    Figure 4 Stretch 4 reps;20 seconds    Other Lumbar Stretch Exercise Standing hip/trunk extension (push hips forward) 10X 3 seconds      Lumbar Exercises: Standing   Scapular Retraction Strengthening;Both;10 reps;Limitations    Scapular Retraction Limitations 5 seconds (shoulder blade pinches)                     PT Education - 07/08/21 1637     Education Details Reviewed exam findings, basic body mechanics, postural and spine education.  Started HEP.    Person(s) Educated Patient    Methods Demonstration;Explanation;Verbal cues;Handout    Comprehension Verbal cues required;Returned demonstration;Need further instruction;Verbalized understanding              PT Short Term Goals - 07/08/21 1651       PT SHORT TERM GOAL #1   Title Improve trunk extension AROM to 10 degrees.    Baseline 0 degrees    Time 4    Period Weeks    Status New    Target Date 08/06/21      PT SHORT TERM GOAL #2   Title Allante will report low back pain consistently 0-4/10 on the Numeric Pain Rating Scale.  Baseline 5-7/10    Time 4    Period Weeks    Status New    Target Date 08/06/21               PT Long Term Goals - 07/08/21 1652       PT LONG  TERM GOAL #1   Title Improve FOTO to 55.    Baseline Was 31    Time 8    Period Weeks    Status New    Target Date 09/03/21      PT LONG TERM GOAL #2   Title Improve low back pain to consistently 0-3/10 on the Numeric Pain Rating Scale.    Baseline 5-7/10    Time 8    Period Weeks    Status New    Target Date 09/03/21      PT LONG TERM GOAL #3   Title Improve B hip flexors flexibility to 110 degrees and hip ER AROM to 40 degrees.    Baseline 100 and 25-30, respectively    Time 8    Period Weeks    Status New    Target Date 09/03/21      PT LONG TERM GOAL #4   Title Improve low back strength as assessed by FOTO scores and subjective self-report.    Baseline 45 minute sitting limit    Time 8    Period Weeks    Status New    Target Date 09/03/21      PT LONG TERM GOAL #5   Title Parneet will be independent with her long-term HEP at [place].    Time 8    Period Weeks    Status New    Target Date 09/03/21                    Plan - 07/08/21 1642     Clinical Impression Statement Radiah has a history of low back pain.  She has been on disability since February because of her back.  She hasn't had sciatica recently but has had it in the past B.  She reports sitting a lot and lists sitting and flexion as aggravating postures.  Postural correction, body mechanics practical work, stretching tight hip muscles (hip flexors) and low back strengthening will be an emphasis with her PT.  Her prognosis to meet the below listed goals is good with adequate attendance and HEP compliance.    Personal Factors and Comorbidities Fitness;Comorbidity 3+    Comorbidities Pulmonary fibrosis, type 2 DM, lumbosacral DDD, lumbar scoliosis    Examination-Activity Limitations Sit;Sleep;Bed Mobility;Bend;Lift    Examination-Participation Restrictions Community Activity    Stability/Clinical Decision Making Stable/Uncomplicated    Clinical Decision Making Low    Rehab Potential Good    PT  Frequency 2x / week    PT Duration 8 weeks    PT Treatment/Interventions ADLs/Self Care Home Management;Moist Heat;Electrical Stimulation;Cryotherapy;Traction;Therapeutic activities;Therapeutic exercise;Neuromuscular re-education;Patient/family education;Manual techniques;Dry needling    PT Next Visit Plan Progress low back strengthening, check on HEP and walking compliance    PT Home Exercise Plan [email or web address]    Consulted and Agree with Plan of Care Patient             Patient will benefit from skilled therapeutic intervention in order to improve the following deficits and impairments:  Decreased activity tolerance, Decreased endurance, Decreased range of motion, Decreased strength, Difficulty walking, Increased edema, Impaired flexibility, Increased muscle spasms, Postural dysfunction, Pain  Visit Diagnosis: Abnormal posture  Muscle  weakness (generalized)  Chronic bilateral low back pain, unspecified whether sciatica present  Difficulty walking     Problem List Patient Active Problem List   Diagnosis Date Noted   Viral syndrome 05/20/2021   Atherosclerosis of aorta (HCC) 01/12/2021   Primary insomnia 01/12/2021   Positive ANA (antinuclear antibody) 09/09/2020   Pulmonary fibrosis (HCC) 06/26/2020   Chronic cough 05/13/2020   Atrophic vaginitis 01/09/2019   Menopausal flushing 01/09/2019   Colon polyp, hyperplastic 01/09/2019   Type 2 diabetes mellitus without complication, without long-term current use of insulin (HCC) 12/11/2017   Normocytic anemia 12/11/2017   Depressive disorder 02/06/2017   Gastroesophageal reflux disease with esophagitis 02/06/2017   DDD (degenerative disc disease), lumbosacral 02/06/2017   Dysphagia 02/06/2017   Scoliosis of lumbar spine 02/06/2017   Aortic regurgitation 10/02/2013   Headache 03/01/2012   Seasonal allergic rhinitis 03/01/2012    Cherlyn Cushing, PT, MPT 07/08/2021, 4:59 PM  [facility] Physical Therapy [address] [place], [place], [address] Phone: [phone]   Fax:  [phone]  Name: Vinita Prentiss MRN: [id number] Date of Birth: 1956/02/01

## 2021-07-08 NOTE — Patient Instructions (Signed)
Access Code: [email or web address] URL: [email or web address] Date: 07/08/2021 Prepared by: Pauletta Browns  Exercises Standing Scapular Retraction - 5 x daily - 7 x weekly - 1 sets - 5 reps - 5 second hold Standing Lumbar Extension at Wall - Forearms - 5 x daily - 7 x weekly - 1 sets - 5 reps - 3 seconds hold Single Knee to Chest Stretch - 2-3 x daily - 7 x weekly - 1 sets - 5 reps - 20 seconds hold Supine Figure 4 Piriformis Stretch - 2-3 x daily - 7 x weekly - 1 sets - 5 reps - 20 seconds hold

## 2021-07-11 IMAGING — DX DG CHEST 2V
2 series · 2 of 2 positions shown · non-contrast
Comparison: May 15, 2020

CLINICAL DATA: Cough and fatigue.  Recent pneumonia

EXAM:
CHEST - 2 VIEW

[chest pa]
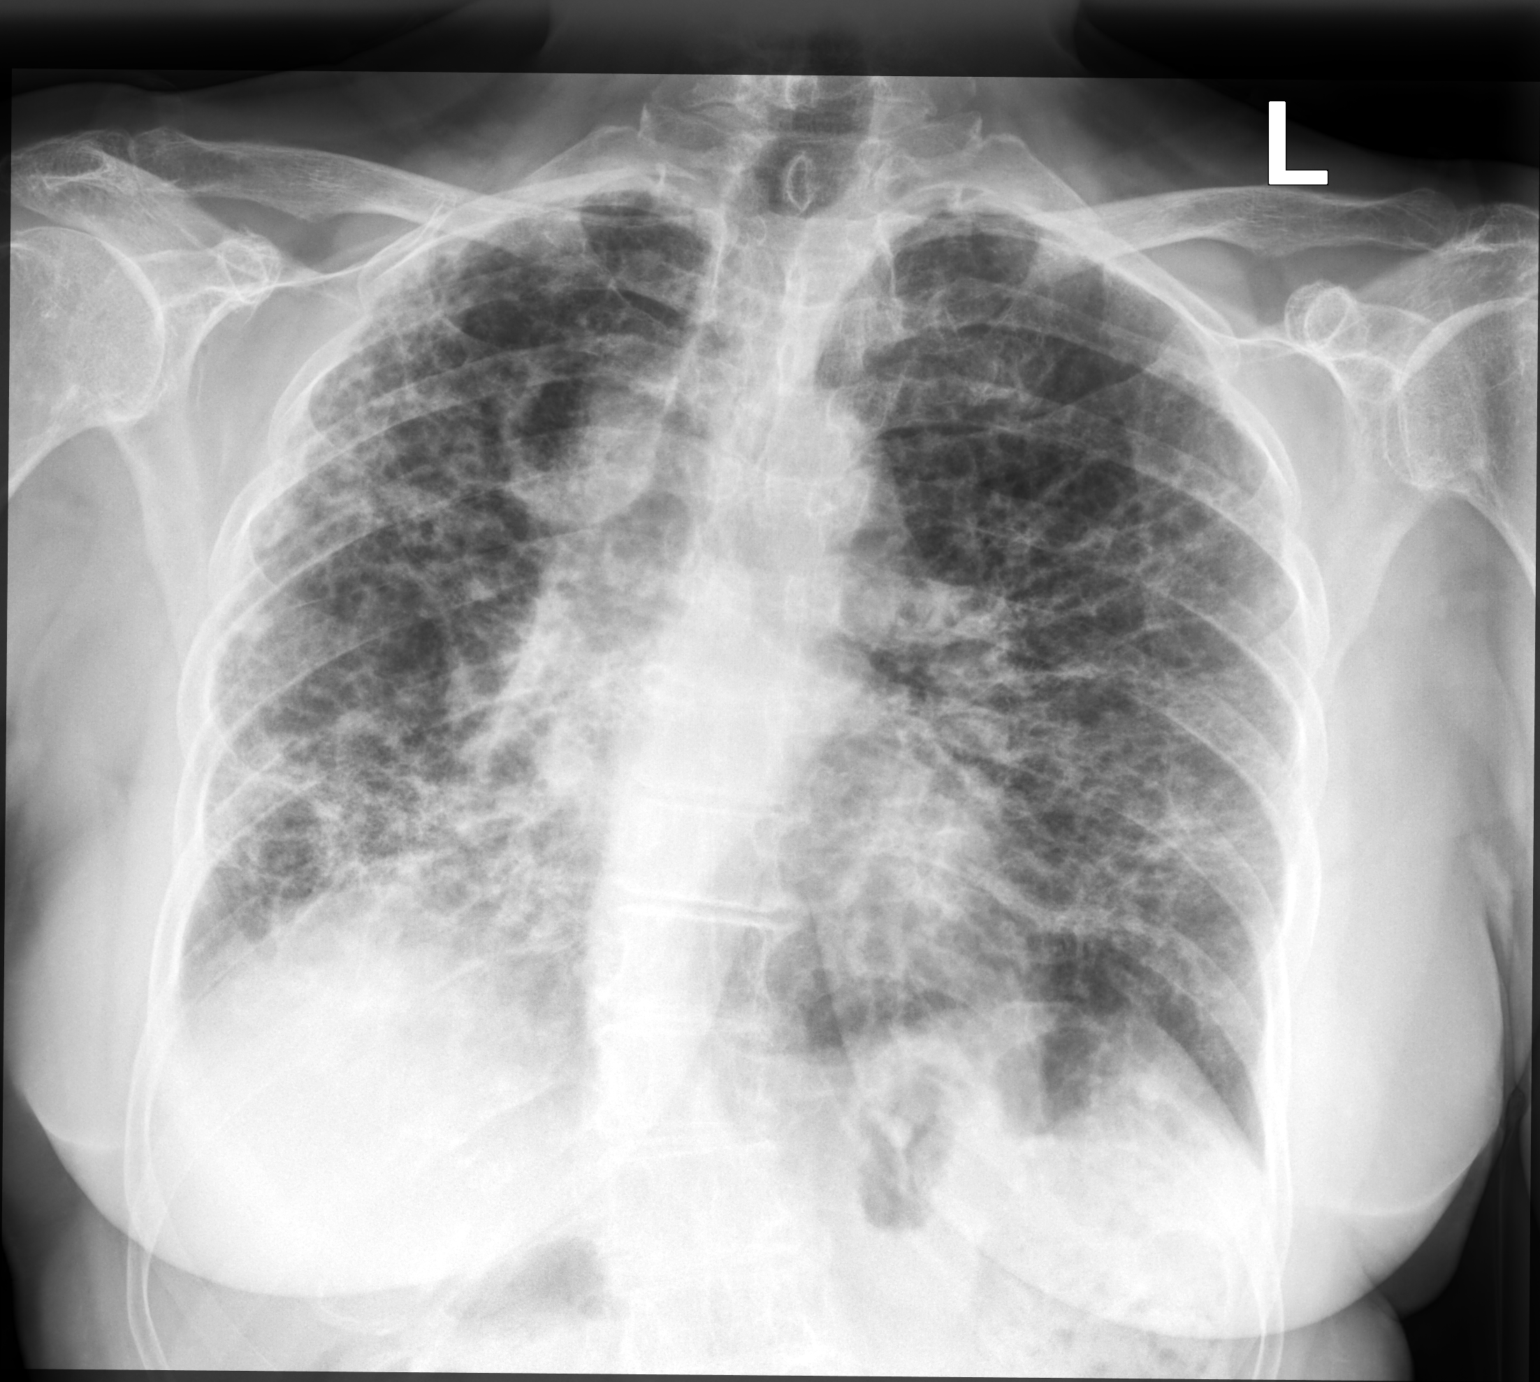

[chest lat]
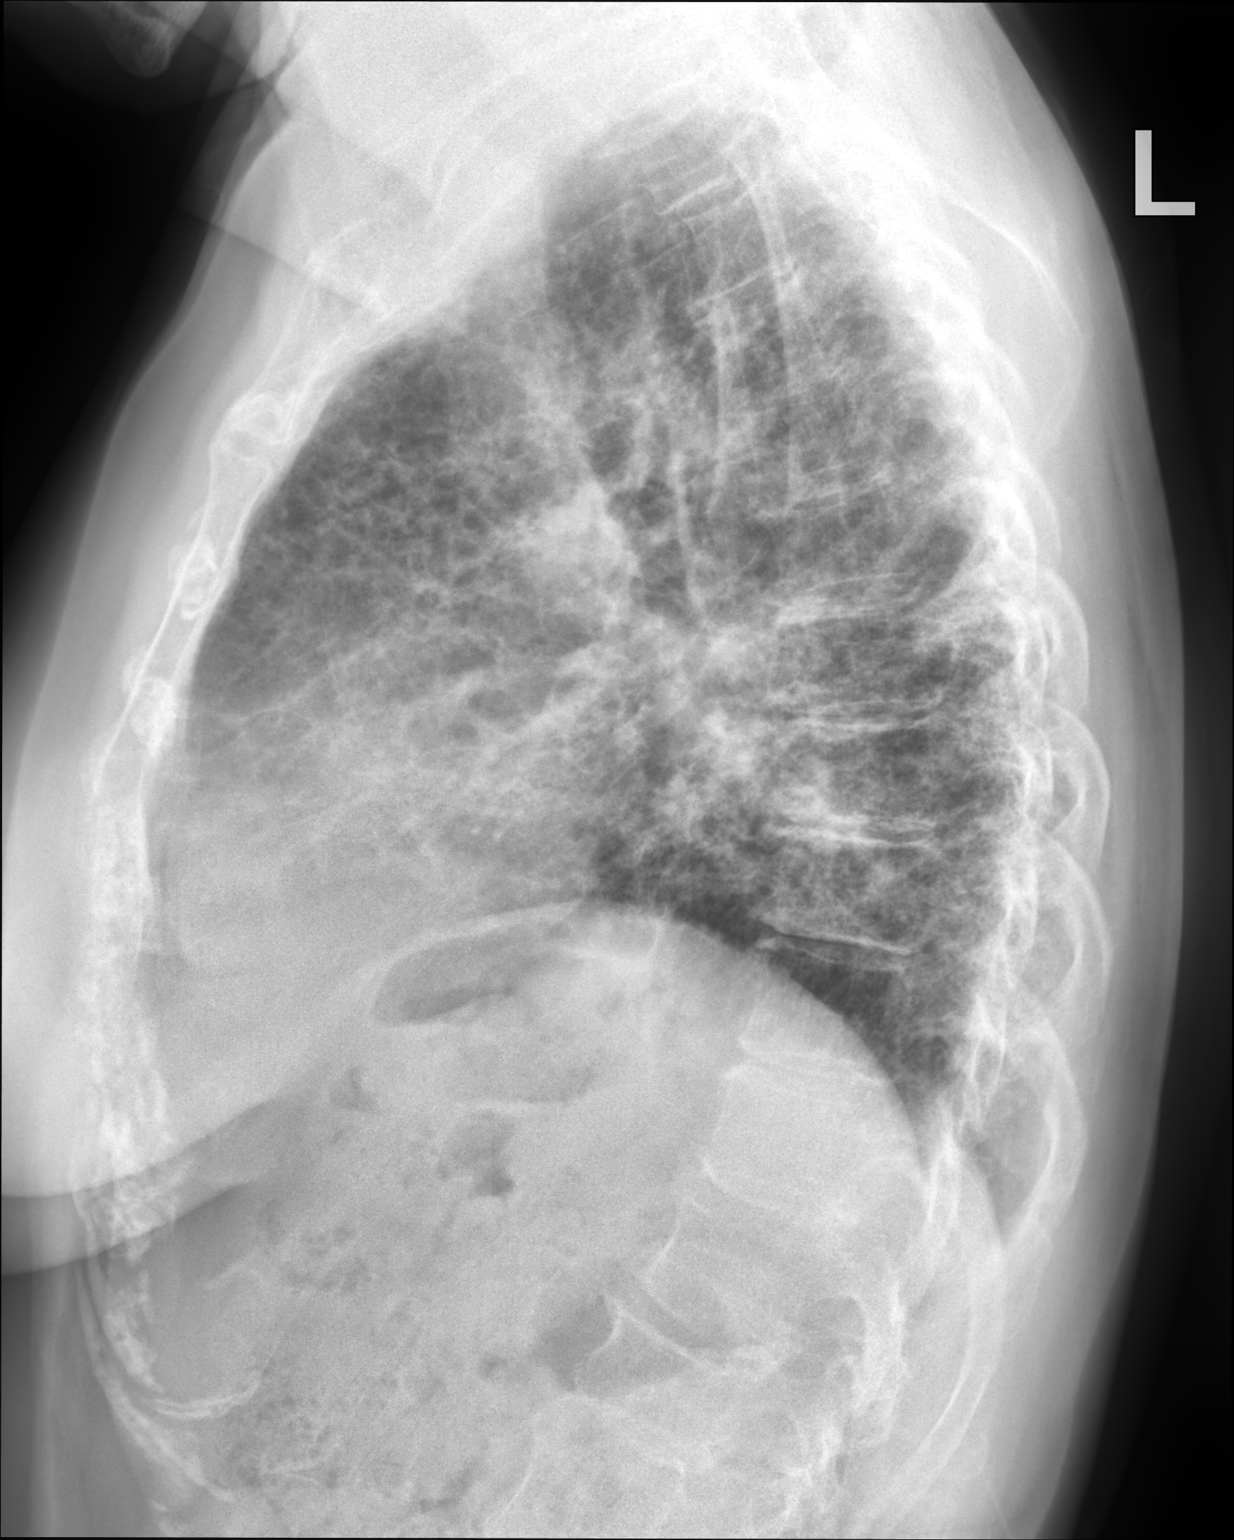

[2 of 2 positions shown; findings below may reference images not displayed]

FINDINGS: There is apparent fibrosis throughout the lungs bilaterally. There
remains ill-defined airspace opacity in the right base which
potentially may represent pneumonia superimposed on fibrosis. No new
opacity evident on either side. Heart size and pulmonary vascularity
are normal. No adenopathy. No bone lesions.
IMPRESSION: Diffuse fibrotic type change bilaterally. Increased opacity in the
right base compared to other areas may reflect a degree of
infiltrate superimposed on fibrosis but also may be due to
asymmetric fibrosis. Given the persistence of this finding, it may
be prudent to consider noncontrast enhanced chest CT for further
assessment.

Heart size and pulmonary vascularity are within normal limits. No
adenopathy evident.

These results will be called to the ordering clinician or
representative by the Radiologist Assistant, and communication
documented in the PACS or [REDACTED].

## 2021-07-12 ENCOUNTER — Other Ambulatory Visit: Payer: Self-pay

## 2021-07-12 ENCOUNTER — Ambulatory Visit (INDEPENDENT_AMBULATORY_CARE_PROVIDER_SITE_OTHER): Payer: BC Managed Care – PPO | Admitting: Physical Therapy

## 2021-07-12 DIAGNOSIS — M545 Low back pain, unspecified: Secondary | ICD-10-CM | POA: Diagnosis not present

## 2021-07-12 DIAGNOSIS — R262 Difficulty in walking, not elsewhere classified: Secondary | ICD-10-CM

## 2021-07-12 DIAGNOSIS — R293 Abnormal posture: Secondary | ICD-10-CM | POA: Diagnosis not present

## 2021-07-12 DIAGNOSIS — M6281 Muscle weakness (generalized): Secondary | ICD-10-CM | POA: Diagnosis not present

## 2021-07-12 DIAGNOSIS — G8929 Other chronic pain: Secondary | ICD-10-CM

## 2021-07-12 NOTE — Therapy (Signed)
[facility] Therapy [address] [place], [place], [address] Phone: [phone]   Fax:  [phone]  Physical Therapy Treatment  Patient Details  Name: Sandra Curtis MRN: [id number] Date of Birth: May 04, 1956 Referring Provider (PT): Sandra Manges MD   Encounter Date: 07/12/2021   PT End of Session - 07/12/21 1504     Visit Number 2    Number of Visits 16    Date for PT Re-Evaluation 09/02/21    PT Start Time 1430    PT Stop Time 1520    PT Time Calculation (min) 50 min    Activity Tolerance No increased pain;Patient tolerated treatment well    Behavior During Therapy [facility] for tasks assessed/performed             Past Medical History:  Diagnosis Date   Allergic rhinitis    Allergy    Chronic back pain 07/20/2018   Chronic midline low back pain 02/06/2017   Depression    Esophageal stricture    GERD (gastroesophageal reflux disease)    Inflammatory polyps of colon (HCC) 2010   Pulmonary filariasis    Systolic murmur    UTI (urinary tract infection)     Past Surgical History:  Procedure Laterality Date   COLONOSCOPY     UPPER GASTROINTESTINAL ENDOSCOPY      There were no vitals filed for this visit.   Subjective Assessment - 07/12/21 1437     Subjective She relays she was in a lot of pain saturday after carrying 3 bags of stuff from store up the stairs. She has been trying the exercises.    Pertinent History Pulmonary fibrosis, type 2 DM, lumbosacral DDD, lumbar scoliosis    Limitations Sitting;House hold activities;Lifting    How long can you sit comfortably? 45 minutes    How long can you stand comfortably? Depends on what she is doing (up to an hour)    How long can you walk comfortably? Due to breathing difficulties she does not walk for exercise    Patient Stated Goals Decrease low back pain    Pain Onset More than a month ago                               [facility] Adult PT Treatment/Exercise -  07/12/21 0001       Lumbar Exercises: Stretches   Single Knee to Chest Stretch Right;Left;2 reps;30 seconds    Pelvic Tilt 20 reps    Piriformis Stretch Right;Left;2 reps;30 seconds    Other Lumbar Stretch Exercise Standing hip/trunk extension (push hips forward) 10X 3 seconds      Lumbar Exercises: Aerobic   Recumbent Bike L3 X8 min      Lumbar Exercises: Machines for Strengthening   Leg Press 75# [id number]      Lumbar Exercises: Standing   Row 20 reps    Theraband Level (Row) Level 2 (Red)    Shoulder Extension 20 reps    Other Standing Lumbar Exercises anti rotation press red X15 ilat    Other Standing Lumbar Exercises deadlift green band coming down to touch mat table and back up 2X10      Lumbar Exercises: Supine   Bridge 15 reps    Bridge Limitations hold 5 sec    Straight Leg Raise 15 reps      Modalities   Modalities Cryotherapy      Cryotherapy   Number Minutes Cryotherapy 10  Minutes    Cryotherapy Location Lumbar Spine    Type of Cryotherapy Ice pack                       PT Short Term Goals - 07/08/21 1651       PT SHORT TERM GOAL #1   Title Improve trunk extension AROM to 10 degrees.    Baseline 0 degrees    Time 4    Period Weeks    Status New    Target Date 08/06/21      PT SHORT TERM GOAL #2   Title Sandra Curtis will report low back pain consistently 0-4/10 on the Numeric Pain Rating Scale.    Baseline 5-7/10    Time 4    Period Weeks    Status New    Target Date 08/06/21               PT Long Term Goals - 07/08/21 1652       PT LONG TERM GOAL #1   Title Improve FOTO to 55.    Baseline Was 31    Time 8    Period Weeks    Status New    Target Date 09/03/21      PT LONG TERM GOAL #2   Title Improve low back pain to consistently 0-3/10 on the Numeric Pain Rating Scale.    Baseline 5-7/10    Time 8    Period Weeks    Status New    Target Date 09/03/21      PT LONG TERM GOAL #3   Title Improve B hip flexors  flexibility to 110 degrees and hip ER AROM to 40 degrees.    Baseline 100 and 25-30, respectively    Time 8    Period Weeks    Status New    Target Date 09/03/21      PT LONG TERM GOAL #4   Title Improve low back strength as assessed by FOTO scores and subjective self-report.    Baseline 45 minute sitting limit    Time 8    Period Weeks    Status New    Target Date 09/03/21      PT LONG TERM GOAL #5   Title Sandra Curtis will be independent with her long-term HEP at [place].    Time 8    Period Weeks    Status New    Target Date 09/03/21                   Plan - 07/12/21 1510     Clinical Impression Statement Session focused on lumbar strengthening and mobility with good overall tolerance and had less pain after her treatment today. She did need cuing at times not to work into pain ROM and to keep things in more gentle pain free ROM. Continue POC    Personal Factors and Comorbidities Fitness;Comorbidity 3+    Comorbidities Pulmonary fibrosis, type 2 DM, lumbosacral DDD, lumbar scoliosis    Examination-Activity Limitations Sit;Sleep;Bed Mobility;Bend;Lift    Examination-Participation Restrictions Community Activity    Stability/Clinical Decision Making Stable/Uncomplicated    Rehab Potential Good    PT Frequency 2x / week    PT Duration 8 weeks    PT Treatment/Interventions ADLs/Self Care Home Management;Moist Heat;Electrical Stimulation;Cryotherapy;Traction;Therapeutic activities;Therapeutic exercise;Neuromuscular re-education;Patient/family education;Manual techniques;Dry needling    PT Next Visit Plan Progress low back strengthening, check on HEP and walking compliance    PT Home Exercise Plan [facility]  Consulted and Agree with Plan of Care Patient             Patient will benefit from skilled therapeutic intervention in order to improve the following deficits and impairments:  Decreased activity tolerance, Decreased endurance, Decreased range of motion, Decreased  strength, Difficulty walking, Increased edema, Impaired flexibility, Increased muscle spasms, Postural dysfunction, Pain  Visit Diagnosis: Abnormal posture  Muscle weakness (generalized)  Chronic bilateral low back pain, unspecified whether sciatica present  Difficulty walking     Problem List Patient Active Problem List   Diagnosis Date Noted   Viral syndrome 05/20/2021   Atherosclerosis of aorta (HCC) 01/12/2021   Primary insomnia 01/12/2021   Positive ANA (antinuclear antibody) 09/09/2020   Pulmonary fibrosis (HCC) 06/26/2020   Chronic cough 05/13/2020   Atrophic vaginitis 01/09/2019   Menopausal flushing 01/09/2019   Colon polyp, hyperplastic 01/09/2019   Type 2 diabetes mellitus without complication, without long-term current use of insulin (HCC) 12/11/2017   Normocytic anemia 12/11/2017   Depressive disorder 02/06/2017   Gastroesophageal reflux disease with esophagitis 02/06/2017   DDD (degenerative disc disease), lumbosacral 02/06/2017   Dysphagia 02/06/2017   Scoliosis of lumbar spine 02/06/2017   Aortic regurgitation 10/02/2013   Headache 03/01/2012   Seasonal allergic rhinitis 03/01/2012    [name], PT,DPT 07/12/2021, 3:13 PM  [facility] Therapy [address] [place], [place], [address] Phone: [phone]   Fax:  [phone]  Name: Sandra Curtis MRN: [id number] Date of Birth: [birth year]

## 2021-07-13 ENCOUNTER — Encounter: Payer: Self-pay | Admitting: Pulmonary Disease

## 2021-07-13 ENCOUNTER — Ambulatory Visit (INDEPENDENT_AMBULATORY_CARE_PROVIDER_SITE_OTHER): Payer: BC Managed Care – PPO | Admitting: Pulmonary Disease

## 2021-07-13 VITALS — BP 112/64 | HR 83 | Temp 98.1°F | Ht 60.0 in | Wt 146.4 lb

## 2021-07-13 DIAGNOSIS — J849 Interstitial pulmonary disease, unspecified: Secondary | ICD-10-CM | POA: Diagnosis not present

## 2021-07-13 DIAGNOSIS — J84112 Idiopathic pulmonary fibrosis: Secondary | ICD-10-CM | POA: Diagnosis not present

## 2021-07-13 DIAGNOSIS — Z5181 Encounter for therapeutic drug level monitoring: Secondary | ICD-10-CM | POA: Diagnosis not present

## 2021-07-13 NOTE — Patient Instructions (Signed)
I am glad you are tolerating the Ofev well We will get high-resolution CT and PFTs in 3 months Follow-up in clinic after these

## 2021-07-13 NOTE — Progress Notes (Signed)
Sandra Curtis    [id number]    1956-06-05  Primary Care Physician:Nche, Bonna Gains, NP  Referring Physician: [name], NP [address] [place],  [place] [address]  Chief complaint: Follow-up for IPF Did not tolerate [name] April 2022  HPI: 65 year old with history of allergies, GERD, esophageal stricture Complains of chronic cough for several years, worsening dyspnea on exertion. She had a CT scan done by Dr. Elease Etienne, primary care showing pulmonary fibrosis and has been referred for further evaluation.  Previously followed by Dr. Russella Dar, GI with EGD in 2004 showing esophageal stricture secondary to acid reflux.  She has not followed up since then. Started on Nexium in August 2021 by primary care.  Has followed up with Dr. Russella Dar, GI who has increased her Protonix to 40 mg twice daily and scheduled EGD Has also seen Dr. Dimple Casey, rheumatology for evaluation of forced of ANA and SSA.  He feels she does not have any signs of autoimmune disease.  Started Esbriet in December 2021.  But did not tolerate even at a low dose due to dizziness and headache. 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  No mold, hot tub, Jacuzzi.  No feather pillows or comforters Smoking history: 18-pack-year smoker.  Quit 1 month ago Travel history: No significant travel history Relevant family history: No significant family issue of lung disease  Interval history: Continues on Ofev 800 mg twice daily.  She is tolerating this well States her dyspnea on exertion is unchanged Continues to struggle with mood changes and depression with a diagnosis of IPF   Outpatient Encounter Medications as of 07/13/2021   Medication Sig   acetaminophen (TYLENOL) 650 MG CR tablet Take 650 mg by mouth every 8 (eight) hours as needed for pain.   albuterol (VENTOLIN HFA) 108 (90 Base) MCG/ACT inhaler Inhale 1-2 puffs into the lungs every 6 (six) hours as needed.   cetirizine (ZYRTEC) 10 MG tablet Take 1 tablet (10 mg total) by mouth at bedtime.   Cholecalciferol (VITAMIN D3) 25 MCG (1000 UT) CAPS Take by mouth.   famotidine (PEPCID) 40 MG tablet Take 40 mg by mouth daily.   fluticasone (FLONASE) 50 MCG/ACT nasal spray Place 2 sprays into both nostrils daily.   Nintedanib (OFEV) 100 MG CAPS TAKE 1 CAPSULE BY MOUTH  TWICE A DAY 12 HOURS APART  WITH FOOD   omeprazole (PRILOSEC) 40 MG capsule TAKE 1 CAPSULE(40 MG) BY MOUTH DAILY   traZODone (DESYREL) 50 MG tablet Take 50 mg by mouth at bedtime.   No facility-administered encounter medications on file as of 07/13/2021.   Physical Exam: Blood pressure 112/64, pulse 83, temperature 98.1 F (36.7 C), temperature source Oral, height 5' (1.524 m), weight 146 lb 6.4 oz (66.4 kg), SpO2 96 %. Gen:      No acute distress HEENT:  EOMI, sclera anicteric Neck:     No masses; no thyromegaly Lungs:    Bibasal crackles CV:         Regular rate and rhythm; no murmurs Abd:      + bowel sounds; soft, non-tender; no palpable masses, no distension Ext:    No edema; adequate peripheral perfusion Skin:      Warm and  dry; no rash Neuro: alert and oriented x 3 Psych: normal mood and affect   Data Reviewed: Imaging: Chest x-ray 05/15/2020-diffuse interstitial opacities CT chest 06/17/2020-reticulation with traction bronchiectasis, honeycombing most prominent in the right lung base.  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  PFTs: 09/09/2020 FVC 1.80 [85%], FEV1 1.55 [94%], F/F 86, TLC 3.05 [60%], DLCO 10.61 [61%] Mild-moderate restriction and diffusion defect  Labs: ANA 06/27/2019 1:40, cytoplasmic, rheumatoid factor less than 14 ANA 07/16/2020-1:40, SSA greater than 8  Assessment:  IPF CT  with UIP pattern fibrosis. She does have mild arthritis symptoms which may be secondary to osteoarthritis but does endorse some morning stiffness and small joint pain.  She had some occupational exposures to textile fiber and dust but no asbestos. Her exposures are not a cause of the pulmonary finrosis No evidence of autoimmune process, connective tissue disease per rheumatology evaluation.  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  Finished pulm rehab Monitor hepatic panel Recheck follow-up high-res CT and PFTs in 3 months  We discussed referral to lung transplantation but he would like to hold off for now until the follow-up CT and PFTs are done  Depression She is still struggling with her new diagnosis with feelings of depression I have offered referral to psychiatry but she wants to hold off for now Have encouraged her to follow-up with primary care  GERD History notable for significant GERD symptoms, reflux and esophageal stricture.  Follows with Dr. Russella Dar.    Plan/Recommendations: Continue Ofev, check hepatic panel High-res CT, PFTs in 3 months  Chilton Greathouse MD [facility] 07/13/2021, 4:02 PM  CC: Nche, Bonna Gains, NP

## 2021-07-14 DIAGNOSIS — Z5181 Encounter for therapeutic drug level monitoring: Secondary | ICD-10-CM

## 2021-07-14 DIAGNOSIS — J84112 Idiopathic pulmonary fibrosis: Secondary | ICD-10-CM

## 2021-07-14 LAB — COMPREHENSIVE METABOLIC PANEL
ALT: 17 U/L (ref 0–35)
AST: 29 U/L (ref 0–37)
Albumin: 4.4 g/dL (ref 3.5–5.2)
Alkaline Phosphatase: 47 U/L (ref 39–117)
BUN: 20 mg/dL (ref 6–23)
CO2: 28 mEq/L (ref 19–32)
Calcium: 9.5 mg/dL (ref 8.4–10.5)
Chloride: 103 mEq/L (ref 96–112)
Creatinine, Ser: 1.2 mg/dL (ref 0.40–1.20)
GFR: 47.57 mL/min — ABNORMAL LOW (ref >60.00)
Glucose, Bld: 84 mg/dL (ref 70–99)
Potassium: 4.2 mEq/L (ref 3.5–5.1)
Sodium: 137 mEq/L (ref 135–145)
Total Bilirubin: 0.3 mg/dL (ref 0.2–1.2)
Total Protein: 8 g/dL (ref 6.0–8.3)

## 2021-07-14 NOTE — Telephone Encounter (Signed)
Dr. Isaiah Serge, please see patient comment regarding lab work from yesterday.  Thank you.

## 2021-07-15 ENCOUNTER — Encounter: Payer: Self-pay | Admitting: Rehabilitative and Restorative Service Providers"

## 2021-07-15 ENCOUNTER — Other Ambulatory Visit: Payer: Self-pay

## 2021-07-15 ENCOUNTER — Ambulatory Visit (INDEPENDENT_AMBULATORY_CARE_PROVIDER_SITE_OTHER): Payer: BC Managed Care – PPO | Admitting: Rehabilitative and Restorative Service Providers"

## 2021-07-15 DIAGNOSIS — M6281 Muscle weakness (generalized): Secondary | ICD-10-CM

## 2021-07-15 DIAGNOSIS — R293 Abnormal posture: Secondary | ICD-10-CM | POA: Diagnosis not present

## 2021-07-15 DIAGNOSIS — M545 Low back pain, unspecified: Secondary | ICD-10-CM | POA: Diagnosis not present

## 2021-07-15 DIAGNOSIS — G8929 Other chronic pain: Secondary | ICD-10-CM

## 2021-07-15 DIAGNOSIS — R262 Difficulty in walking, not elsewhere classified: Secondary | ICD-10-CM | POA: Diagnosis not present

## 2021-07-15 NOTE — Patient Instructions (Signed)
Access Code: [email or web address] URL: [email or web address] Date: 07/15/2021 Prepared by: Pauletta Browns  Exercises Standing Scapular Retraction - 5 x daily - 7 x weekly - 1 sets - 5 reps - 5 second hold Standing Lumbar Extension at Wall - Forearms - 5 x daily - 7 x weekly - 1 sets - 5 reps - 3 seconds hold Single Knee to Chest Stretch - 2-3 x daily - 7 x weekly - 1 sets - 5 reps - 20 seconds hold Supine Figure 4 Piriformis Stretch - 2-3 x daily - 7 x weekly - 1 sets - 5 reps - 20 seconds hold Prone Hip Extension - 1 x daily - 7 x weekly - 2-3 sets - 10 reps - 3 seconds hold Standing Hip Hiking - 2 x daily - 7 x weekly - 2 sets - 10 reps - 3 seconds hold

## 2021-07-15 NOTE — Telephone Encounter (Signed)
Liver test looks normal.  There is a small reduction in how her kidneys are functioning.  We need to keep a watch on this.  Advise her to stay well-hydrated.  Repeat comprehensive metabolic panel in 1 month  I would also advise follow-up with your primary care

## 2021-07-15 NOTE — Therapy (Signed)
[facility] Physical Therapy [address] [place], [place], [address] Phone: [phone]   Fax:  [phone]  Physical Therapy Treatment  Patient Details  Name: Sandra Curtis MRN: [id number] Date of Birth: October 17, 1955 Referring Provider (PT): Eldred Manges MD   Encounter Date: 07/15/2021   PT End of Session - 07/15/21 1143     Visit Number 3    Number of Visits 16    Date for PT Re-Evaluation 09/02/21    Progress Note Due on Visit 10    PT Start Time 1101    PT Stop Time 1142    PT Time Calculation (min) 41 min    Activity Tolerance No increased pain;Patient tolerated treatment well    Behavior During Therapy [facility] for tasks assessed/performed             Past Medical History:  Diagnosis Date   Allergic rhinitis    Allergy    Chronic back pain 07/20/2018   Chronic midline low back pain 02/06/2017   Depression    Esophageal stricture    GERD (gastroesophageal reflux disease)    Inflammatory polyps of colon (HCC) 2010   Pulmonary filariasis (HCC)    Systolic murmur    UTI (urinary tract infection)     Past Surgical History:  Procedure Laterality Date   COLONOSCOPY     UPPER GASTROINTESTINAL ENDOSCOPY      There were no vitals filed for this visit.   Subjective Assessment - 07/15/21 1112     Subjective [name] reports consistent HEP compliance.  She is beginning to pay closer mechanics to her body mechanics.    Pertinent History Pulmonary fibrosis, type 2 DM, lumbosacral DDD, lumbar scoliosis    Limitations Sitting;House hold activities;Lifting    How long can you sit comfortably? 45 minutes    How long can you stand comfortably? Depends on what she is doing (up to an hour)    How long can you walk comfortably? Due to breathing difficulties she does not walk for exercise    Patient Stated Goals Decrease low back pain    Currently in Pain? Yes    Pain Score 5     Pain Location Back    Pain Orientation Lower    Pain Descriptors /  Indicators Aching;Sore    Pain Type Chronic pain    Pain Radiating Towards NA    Pain Onset More than a month ago    Pain Frequency Constant    Aggravating Factors  Flexed and slouched postures    Pain Relieving Factors Change of position    Effect of Pain on Daily Activities Poor conditioning (lung disease) and limited endurance with ADLs    Multiple Pain Sites No                               [facility] Adult PT Treatment/Exercise - 07/15/21 0001       Posture/Postural Control   Posture/Postural Control Postural limitations    Postural Limitations Flexed trunk      Therapeutic Activites    Therapeutic Activities Other Therapeutic Activities    Other Therapeutic Activities Reviewed mechanics from day 1 and reviewed/added to HEP      Exercises   Exercises Lumbar      Lumbar Exercises: Stretches   Single Knee to Chest Stretch Left;Right;4 reps;20 seconds    Single Knee to Chest Stretch Limitations Also with one leg hanging off the table 2X  20 seconds each side    Figure 4 Stretch 4 reps;20 seconds    Other Lumbar Stretch Exercise Standing hip/trunk extension (push hips forward) 10X 3 seconds      Lumbar Exercises: Standing   Scapular Retraction Strengthening;Both;10 reps;Limitations    Scapular Retraction Limitations 5 seconds (shoulder blade pinches)    Other Standing Lumbar Exercises Alternating hip hike 2 sets of 10 3 seconds (in doorframe with pelvic stabilization)      Lumbar Exercises: Seated   Sit to Stand 10 reps;Limitations    Sit to Stand Limitations No hands      Lumbar Exercises: Prone   Straight Leg Raise 10 reps;3 seconds    Straight Leg Raises Limitations Forehead on forearms, toes back and keep knee straight                     PT Education - 07/15/21 1142     Education Details Reviewed day 1 body mechanics and corrected HEP.  Added additional core and leg strengthening to HEP.  Added leg strength for improved body mechanics  compliance.    Person(s) Educated Patient    Methods Explanation;Demonstration;Tactile cues;Verbal cues;Handout    Comprehension Verbal cues required;Need further instruction;Returned demonstration;Verbalized understanding;Tactile cues required              PT Short Term Goals - 07/08/21 1651       PT SHORT TERM GOAL #1   Title Improve trunk extension AROM to 10 degrees.    Baseline 0 degrees    Time 4    Period Weeks    Status New    Target Date 08/06/21      PT SHORT TERM GOAL #2   Title Monserat will report low back pain consistently 0-4/10 on the Numeric Pain Rating Scale.    Baseline 5-7/10    Time 4    Period Weeks    Status New    Target Date 08/06/21               PT Long Term Goals - 07/08/21 1652       PT LONG TERM GOAL #1   Title Improve FOTO to 55.    Baseline Was 31    Time 8    Period Weeks    Status New    Target Date 09/03/21      PT LONG TERM GOAL #2   Title Improve low back pain to consistently 0-3/10 on the Numeric Pain Rating Scale.    Baseline 5-7/10    Time 8    Period Weeks    Status New    Target Date 09/03/21      PT LONG TERM GOAL #3   Title Improve B hip flexors flexibility to 110 degrees and hip ER AROM to 40 degrees.    Baseline 100 and 25-30, respectively    Time 8    Period Weeks    Status New    Target Date 09/03/21      PT LONG TERM GOAL #4   Title Improve low back strength as assessed by FOTO scores and subjective self-report.    Baseline 45 minute sitting limit    Time 8    Period Weeks    Status New    Target Date 09/03/21      PT LONG TERM GOAL #5   Title Bijou will be independent with her long-term HEP at DC.    Time 8    Period Weeks  Status New    Target Date 09/03/21                   Plan - 07/15/21 1144     Clinical Impression Statement Dondra Spry reports and demonstrates good early HEP compliance.  Consistency with body mechanics will benefit from additional supervised practice.   Core strength and leg strength will be an emphasis with her HEP along with postural correction.  Her prognosis is good with the recommended POC.    Personal Factors and Comorbidities Fitness;Comorbidity 3+    Comorbidities Pulmonary fibrosis, type 2 DM, lumbosacral DDD, lumbar scoliosis    Examination-Activity Limitations Sit;Sleep;Bed Mobility;Bend;Lift    Examination-Participation Restrictions Community Activity    Stability/Clinical Decision Making Stable/Uncomplicated    Rehab Potential Good    PT Frequency 2x / week    PT Duration 8 weeks    PT Treatment/Interventions ADLs/Self Care Home Management;Moist Heat;Electrical Stimulation;Cryotherapy;Traction;Therapeutic activities;Therapeutic exercise;Neuromuscular re-education;Patient/family education;Manual techniques;Dry needling    PT Next Visit Plan Progress low back and LE strength, practical body mechanics, postural correction    PT Home Exercise Plan [email or web address]    Consulted and Agree with Plan of Care Patient             Patient will benefit from skilled therapeutic intervention in order to improve the following deficits and impairments:  Decreased activity tolerance, Decreased endurance, Decreased range of motion, Decreased strength, Difficulty walking, Increased edema, Impaired flexibility, Increased muscle spasms, Postural dysfunction, Pain  Visit Diagnosis: Abnormal posture  Muscle weakness (generalized)  Chronic bilateral low back pain, unspecified whether sciatica present  Difficulty walking     Problem List Patient Active Problem List   Diagnosis Date Noted   Viral syndrome 05/20/2021   Atherosclerosis of aorta (HCC) 01/12/2021   Primary insomnia 01/12/2021   Positive ANA (antinuclear antibody) 09/09/2020   Pulmonary fibrosis (HCC) 06/26/2020   Chronic cough 05/13/2020   Atrophic vaginitis 01/09/2019   Menopausal flushing 01/09/2019   Colon polyp, hyperplastic 01/09/2019   Type 2 diabetes mellitus without  complication, without long-term current use of insulin (HCC) 12/11/2017   Normocytic anemia 12/11/2017   Depressive disorder 02/06/2017   Gastroesophageal reflux disease with esophagitis 02/06/2017   DDD (degenerative disc disease), lumbosacral 02/06/2017   Dysphagia 02/06/2017   Scoliosis of lumbar spine 02/06/2017   Aortic regurgitation 10/02/2013   Headache 03/01/2012   Seasonal allergic rhinitis 03/01/2012    Cherlyn Cushing, PT, MPT 07/15/2021, 11:49 AM  [facility] Physical Therapy [address] [place], [place], [address] Phone: [phone]   Fax:  [phone]  Name: Margurete Guaman MRN: [id number] Date of Birth: 02-Oct-1956

## 2021-07-23 ENCOUNTER — Encounter: Payer: Self-pay | Admitting: Rehabilitative and Restorative Service Providers"

## 2021-07-23 ENCOUNTER — Ambulatory Visit (INDEPENDENT_AMBULATORY_CARE_PROVIDER_SITE_OTHER): Payer: BC Managed Care – PPO | Admitting: Rehabilitative and Restorative Service Providers"

## 2021-07-23 ENCOUNTER — Other Ambulatory Visit: Payer: Self-pay

## 2021-07-23 DIAGNOSIS — R262 Difficulty in walking, not elsewhere classified: Secondary | ICD-10-CM | POA: Diagnosis not present

## 2021-07-23 DIAGNOSIS — R293 Abnormal posture: Secondary | ICD-10-CM | POA: Diagnosis not present

## 2021-07-23 DIAGNOSIS — M6281 Muscle weakness (generalized): Secondary | ICD-10-CM | POA: Diagnosis not present

## 2021-07-23 DIAGNOSIS — M545 Low back pain, unspecified: Secondary | ICD-10-CM

## 2021-07-23 DIAGNOSIS — G8929 Other chronic pain: Secondary | ICD-10-CM

## 2021-07-23 NOTE — Therapy (Signed)
[facility] Physical Therapy [address] [place], [place], [address] Phone: [phone]   Fax:  [phone]  Physical Therapy Treatment  Patient Details  Name: Sandra Curtis MRN: [id number] Date of Birth: Feb 21, 1956 Referring Provider (PT): Eldred Manges MD   Encounter Date: 07/23/2021   PT End of Session - 07/23/21 1806     Visit Number 4    Number of Visits 16    Date for PT Re-Evaluation 09/02/21    Progress Note Due on Visit 10    PT Start Time 1146    PT Stop Time 1228    PT Time Calculation (min) 42 min    Activity Tolerance No increased pain;Patient tolerated treatment well    Behavior During Therapy [facility] for tasks assessed/performed             Past Medical History:  Diagnosis Date   Allergic rhinitis    Allergy    Chronic back pain 07/20/2018   Chronic midline low back pain 02/06/2017   Depression    Esophageal stricture    GERD (gastroesophageal reflux disease)    Inflammatory polyps of colon (HCC) 2010   Pulmonary filariasis (HCC)    Systolic murmur    UTI (urinary tract infection)     Past Surgical History:  Procedure Laterality Date   COLONOSCOPY     UPPER GASTROINTESTINAL ENDOSCOPY      There were no vitals filed for this visit.   Subjective Assessment - 07/23/21 1151     Subjective Sandra Curtis reports consistent HEP compliance.  She continues to pay close attention to her body mechanics.  4/10 pain at worst this week.    Pertinent History Pulmonary fibrosis, type 2 DM, lumbosacral DDD, lumbar scoliosis    Limitations Sitting;House hold activities;Lifting    How long can you sit comfortably? 45 minutes    How long can you stand comfortably? Depends on what she is doing (up to an hour)    How long can you walk comfortably? Due to breathing difficulties she does not walk for exercise    Patient Stated Goals Decrease low back pain    Currently in Pain? Yes    Pain Score 4     Pain Location Back    Pain Orientation Lower     Pain Descriptors / Indicators Aching;Sore    Pain Type Chronic pain    Pain Radiating Towards NA    Pain Onset More than a month ago    Pain Frequency Occasional    Aggravating Factors  Flexion, twisting, prolonged postures, carrying groceries upstairs    Pain Relieving Factors Change of position and exercises    Effect of Pain on Daily Activities Limited endurance and difficulty with morning activities    Multiple Pain Sites No                               [facility] Adult PT Treatment/Exercise - 07/23/21 0001       Posture/Postural Control   Posture/Postural Control Postural limitations    Postural Limitations Flexed trunk      Exercises   Exercises Lumbar      Lumbar Exercises: Stretches   Single Knee to Chest Stretch Left;Right;4 reps;20 seconds    Single Knee to Chest Stretch Limitations Also with one leg hanging off the table 2X 20 seconds each side    Figure 4 Stretch 4 reps;20 seconds    Other Lumbar Stretch Exercise Standing  hip/trunk extension (push hips forward) 10X 3 seconds    Other Lumbar Stretch Exercise Standing hip flexors stretch (1 foot in chair, on ball of back foot, spine straight with slight turn towards foot in chair) 5X 20 seconds      Lumbar Exercises: Standing   Scapular Retraction Strengthening;Both;10 reps;Limitations    Scapular Retraction Limitations 5 seconds (shoulder blade pinches)    Other Standing Lumbar Exercises Alternating hip hike 2 sets of 10 3 seconds (in doorframe with pelvic stabilization)    Other Standing Lumbar Exercises Hip abduction with pelvic stabilization 2 sets of 5 for 3 seconds (hike and push)      Lumbar Exercises: Seated   Sit to Stand 10 reps;Limitations    Sit to Stand Limitations No hands      Lumbar Exercises: Prone   Straight Leg Raise 10 reps;3 seconds    Straight Leg Raises Limitations Forehead on forearms, toes back and keep knee straight 2 sets                     PT Education  - 07/23/21 1805     Education Details Reviewed HEP with addition of hip flexors stretching (more aggressive), progressed hip abductors and spine stabilization exercises.    Person(s) Educated Patient    Methods Explanation;Demonstration;Verbal cues;Handout    Comprehension Verbalized understanding;Returned demonstration;Need further instruction;Verbal cues required              PT Short Term Goals - 07/08/21 1651       PT SHORT TERM GOAL #1   Title Improve trunk extension AROM to 10 degrees.    Baseline 0 degrees    Time 4    Period Weeks    Status New    Target Date 08/06/21      PT SHORT TERM GOAL #2   Title Sandra Curtis will report low back pain consistently 0-4/10 on the Numeric Pain Rating Scale.    Baseline 5-7/10    Time 4    Period Weeks    Status New    Target Date 08/06/21               PT Long Term Goals - 07/08/21 1652       PT LONG TERM GOAL #1   Title Improve FOTO to 55.    Baseline Was 31    Time 8    Period Weeks    Status New    Target Date 09/03/21      PT LONG TERM GOAL #2   Title Improve low back pain to consistently 0-3/10 on the Numeric Pain Rating Scale.    Baseline 5-7/10    Time 8    Period Weeks    Status New    Target Date 09/03/21      PT LONG TERM GOAL #3   Title Improve B hip flexors flexibility to 110 degrees and hip ER AROM to 40 degrees.    Baseline 100 and 25-30, respectively    Time 8    Period Weeks    Status New    Target Date 09/03/21      PT LONG TERM GOAL #4   Title Improve low back strength as assessed by FOTO scores and subjective self-report.    Baseline 45 minute sitting limit    Time 8    Period Weeks    Status New    Target Date 09/03/21      PT LONG TERM GOAL #5  Title Sandra Curtis will be independent with her long-term HEP at DC.    Time 8    Period Weeks    Status New    Target Date 09/03/21                   Plan - 07/23/21 1807     Clinical Impression Statement Sandra Curtis reports some  inconsistency with her HEP this week although body mechanics awareness remains good.  Advanced hip flexors stretching, hip abductors strength and low back strength activities.  Possible reassessment next week to assess objective and functional progress towards ST and LT goals.    Personal Factors and Comorbidities Fitness;Comorbidity 3+    Comorbidities Pulmonary fibrosis, type 2 DM, lumbosacral DDD, lumbar scoliosis    Examination-Activity Limitations Sit;Sleep;Bed Mobility;Bend;Lift    Examination-Participation Restrictions Community Activity    Stability/Clinical Decision Making Stable/Uncomplicated    Rehab Potential Good    PT Frequency 2x / week    PT Duration 8 weeks    PT Treatment/Interventions ADLs/Self Care Home Management;Moist Heat;Electrical Stimulation;Cryotherapy;Traction;Therapeutic activities;Therapeutic exercise;Neuromuscular re-education;Patient/family education;Manual techniques;Dry needling    PT Next Visit Plan Progress low back and LE strength, practical body mechanics, postural correction, possible RA/FOTO    PT Home Exercise Plan [email or web address]    Consulted and Agree with Plan of Care Patient             Patient will benefit from skilled therapeutic intervention in order to improve the following deficits and impairments:  Decreased activity tolerance, Decreased endurance, Decreased range of motion, Decreased strength, Difficulty walking, Increased edema, Impaired flexibility, Increased muscle spasms, Postural dysfunction, Pain  Visit Diagnosis: Abnormal posture  Muscle weakness (generalized)  Chronic bilateral low back pain, unspecified whether sciatica present  Difficulty walking     Problem List Patient Active Problem List   Diagnosis Date Noted   Viral syndrome 05/20/2021   Atherosclerosis of aorta (HCC) 01/12/2021   Primary insomnia 01/12/2021   Positive ANA (antinuclear antibody) 09/09/2020   Pulmonary fibrosis (HCC) 06/26/2020   Chronic cough  05/13/2020   Atrophic vaginitis 01/09/2019   Menopausal flushing 01/09/2019   Colon polyp, hyperplastic 01/09/2019   Type 2 diabetes mellitus without complication, without long-term current use of insulin (HCC) 12/11/2017   Normocytic anemia 12/11/2017   Depressive disorder 02/06/2017   Gastroesophageal reflux disease with esophagitis 02/06/2017   DDD (degenerative disc disease), lumbosacral 02/06/2017   Dysphagia 02/06/2017   Scoliosis of lumbar spine 02/06/2017   Aortic regurgitation 10/02/2013   Headache 03/01/2012   Seasonal allergic rhinitis 03/01/2012    Cherlyn Cushing, PT, MPT 07/23/2021, 6:09 PM  [facility] Physical Therapy [address] [place], [place], [address] Phone: [phone]   Fax:  [phone]  Name: Sandra Curtis MRN: [id number] Date of Birth: 08-11-1956

## 2021-07-23 NOTE — Telephone Encounter (Signed)
Patient had virtual visit with Claris Gower after message.

## 2021-07-23 NOTE — Patient Instructions (Signed)
Access Code: [email or web address] URL: [email or web address] Date: 07/23/2021 Prepared by: Pauletta Browns  Exercises Standing Scapular Retraction - 5 x daily - 7 x weekly - 1 sets - 5 reps - 5 second hold Standing Lumbar Extension at Wall - Forearms - 5 x daily - 7 x weekly - 1 sets - 5 reps - 3 seconds hold Single Knee to Chest Stretch - 2-3 x daily - 7 x weekly - 1 sets - 5 reps - 20 seconds hold Supine Figure 4 Piriformis Stretch - 2-3 x daily - 7 x weekly - 1 sets - 5 reps - 20 seconds hold Prone Hip Extension - 1 x daily - 7 x weekly - 2-3 sets - 10 reps - 3 seconds hold Standing Hip Hiking - 2 x daily - 7 x weekly - 2 sets - 10 reps - 3 seconds hold

## 2021-07-24 ENCOUNTER — Other Ambulatory Visit: Payer: Self-pay | Admitting: Nurse Practitioner

## 2021-07-24 DIAGNOSIS — I700 Atherosclerosis of aorta: Secondary | ICD-10-CM

## 2021-07-28 ENCOUNTER — Telehealth: Payer: Self-pay | Admitting: Rehabilitative and Restorative Service Providers"

## 2021-07-28 ENCOUNTER — Encounter: Payer: BC Managed Care – PPO | Admitting: Rehabilitative and Restorative Service Providers"

## 2021-07-28 NOTE — Telephone Encounter (Signed)
Sandra Curtis appears to be distracted by her mother recently passing away as she usually makes her appointments.  She confirmed her 1 PM Friday appointment.

## 2021-07-30 ENCOUNTER — Other Ambulatory Visit: Payer: Self-pay

## 2021-07-30 ENCOUNTER — Encounter: Payer: Self-pay | Admitting: Rehabilitative and Restorative Service Providers"

## 2021-07-30 ENCOUNTER — Ambulatory Visit (INDEPENDENT_AMBULATORY_CARE_PROVIDER_SITE_OTHER): Payer: BC Managed Care – PPO | Admitting: Rehabilitative and Restorative Service Providers"

## 2021-07-30 DIAGNOSIS — R293 Abnormal posture: Secondary | ICD-10-CM

## 2021-07-30 DIAGNOSIS — G8929 Other chronic pain: Secondary | ICD-10-CM

## 2021-07-30 DIAGNOSIS — M545 Low back pain, unspecified: Secondary | ICD-10-CM

## 2021-07-30 DIAGNOSIS — R262 Difficulty in walking, not elsewhere classified: Secondary | ICD-10-CM | POA: Diagnosis not present

## 2021-07-30 DIAGNOSIS — M6281 Muscle weakness (generalized): Secondary | ICD-10-CM

## 2021-07-30 NOTE — Patient Instructions (Signed)
Access Code: [email or web address] URL: [email or web address] Date: 07/30/2021 Prepared by: Pauletta Browns  Exercises Standing Scapular Retraction - 5 x daily - 7 x weekly - 1 sets - 5 reps - 5 second hold Standing Lumbar Extension at Wall - Forearms - 5 x daily - 7 x weekly - 1 sets - 5 reps - 3 seconds hold Single Knee to Chest Stretch - 2 x daily - 7 x weekly - 1 sets - 5 reps - 20 seconds hold Supine Figure 4 Piriformis Stretch - 2 x daily - 7 x weekly - 1 sets - 5 reps - 20 seconds hold Prone Hip Extension - 2 x daily - 7 x weekly - 2 sets - 10 reps - 3 seconds hold Standing Hip Hiking - 2 x daily - 7 x weekly - 2 sets - 10 reps - 3 seconds hold

## 2021-07-30 NOTE — Therapy (Signed)
[place], [place], [address] Phone: [phone]   Fax:  [phone]  Physical Therapy Treatment  Patient Details  Name: Sandra Curtis MRN: [id number] Date of Birth: [birth year] Referring Provider (PT): Marybelle Killings MD  Progress Note Reporting Period 07/08/2021 to 07/30/2021  See note below for Objective Data and Assessment of Progress/Goals.     Encounter Date: 07/30/2021   PT End of Session - 07/30/21 1647     Visit Number 5    Number of Visits 16    Date for PT Re-Evaluation 09/02/21    Progress Note Due on Visit 15    PT Start Time 1300    PT Stop Time 1346    PT Time Calculation (min) 46 min    Activity Tolerance No increased pain;Patient tolerated treatment well    Behavior During Therapy [facility] for tasks assessed/performed             Past Medical History:  Diagnosis Date   Allergic rhinitis    Allergy    Chronic back pain 07/20/2018   Chronic midline low back pain 02/06/2017   Depression    Esophageal stricture    GERD (gastroesophageal reflux disease)    Inflammatory polyps of colon ([place]) 2010   Pulmonary filariasis (HCC)    Systolic murmur    UTI (urinary tract infection)     Past Surgical History:  Procedure Laterality Date   COLONOSCOPY     UPPER GASTROINTESTINAL ENDOSCOPY      There were no vitals filed for this visit.   Subjective Assessment - 07/30/21 1303     Subjective Sandra Curtis reports taking tylenol this morning.  She has been distracted by family matters and has not been as good with her HEP.    Pertinent History Pulmonary fibrosis, type 2 DM, lumbosacral DDD, lumbar scoliosis    Limitations Sitting;House hold activities;Lifting    How long can you sit comfortably? 45 minutes    How long can you stand comfortably? Depends on what she is doing (up to an hour)    How long can you walk comfortably? Due to breathing difficulties she does not walk for exercise    Patient Stated  Goals Decrease low back pain    Currently in Pain? Yes    Pain Score 4     Pain Location Back    Pain Orientation Lower    Pain Descriptors / Indicators Aching;Sore    Pain Type Chronic pain    Pain Radiating Towards NA    Pain Onset More than a month ago    Pain Frequency Occasional    Aggravating Factors  Flexion, twisting, prolonged postures, carrying groceries upstairs    Pain Relieving Factors Change of position and exercises    Effect of Pain on Daily Activities Limited endurance and difficulty with morning activities    Multiple Pain Sites No                [facility] PT Assessment - 07/30/21 0001       Observation/Other Assessments   Focus on Therapeutic Outcomes (FOTO)  52 (was 31, Goal 55)      ROM / Strength   AROM / PROM / Strength AROM      AROM   Overall AROM  Deficits    AROM Assessment Site Hip;Lumbar    Right/Left Hip Left;Right    Right Hip Flexion 115    Right Hip External Rotation  28    Right Hip  Internal Rotation  20    Left Hip Flexion 120    Left Hip External Rotation  30    Left Hip Internal Rotation  15    Lumbar Extension 10      Flexibility   Soft Tissue Assessment /Muscle Length yes    Hamstrings 60 degrees/55 degrees L/R                           [facility] Adult PT Treatment/Exercise - 07/30/21 0001       Posture/Postural Control   Posture/Postural Control Postural limitations    Postural Limitations Flexed trunk      Exercises   Exercises Lumbar      Lumbar Exercises: Stretches   Single Knee to Chest Stretch Left;Right;4 reps;20 seconds    Single Knee to Chest Stretch Limitations Also with one leg hanging off the table 2X 20 seconds each side    Figure 4 Stretch 4 reps;20 seconds    Other Lumbar Stretch Exercise Standing hip/trunk extension (push hips forward) 10X 3 seconds    Other Lumbar Stretch Exercise Standing hip flexors stretch (1 foot in chair, on ball of back foot, spine straight with slight turn towards  foot in chair) 2X 20 seconds      Lumbar Exercises: Standing   Scapular Retraction Strengthening;Both;10 reps;Limitations    Scapular Retraction Limitations 5 seconds (shoulder blade pinches)    Other Standing Lumbar Exercises Alternating hip hike 10X 3 seconds (in doorframe with pelvic stabilization)    Other Standing Lumbar Exercises Hip abduction with pelvic stabilization 5X for 3 seconds (hike and push)      Lumbar Exercises: Seated   Sit to Stand 10 reps;Limitations    Sit to Stand Limitations No hands      Lumbar Exercises: Prone   Straight Leg Raise 10 reps;3 seconds    Straight Leg Raises Limitations Forehead on forearms, toes back and keep knee straight 2 sets    Opposite Arm/Leg Raise Left arm/Right leg;Right arm/Left leg;10 reps;3 seconds                     PT Education - 07/30/21 1643     Education Details Reviewed RA findings and HEP.  Encouraged 2X/day HEP compliance (has been 1X/day recently).    Person(s) Educated Patient    Methods Explanation;Handout;Tactile cues;Verbal cues;Demonstration    Comprehension Verbalized understanding;Tactile cues required;Need further instruction;Returned demonstration;Verbal cues required              PT Short Term Goals - 07/30/21 1645       PT SHORT TERM GOAL #1   Title Improve trunk extension AROM to 10 degrees.    Baseline 0 degrees    Time 4    Period Weeks    Status Achieved    Target Date 08/06/21      PT SHORT TERM GOAL #2   Title Sandra Curtis will report low back pain consistently 0-4/10 on the Numeric Pain Rating Scale.    Baseline was 5-7/10    Time 4    Period Weeks    Status Achieved    Target Date 08/06/21               PT Long Term Goals - 07/30/21 1645       PT LONG TERM GOAL #1   Title Improve FOTO to 55.    Baseline 52 (Was 31)    Time 8    Period  Weeks    Status On-going    Target Date 09/03/21      PT LONG TERM GOAL #2   Title Improve low back pain to consistently 0-3/10  on the Numeric Pain Rating Scale.    Baseline 3-4/10 (was 5-7/10)    Time 8    Period Weeks    Status On-going    Target Date 09/03/21      PT LONG TERM GOAL #3   Title Improve B hip flexors flexibility to 110 degrees and hip ER AROM to 40 degrees.    Baseline See objective    Time 8    Period Weeks    Status Partially Met    Target Date 09/03/21      PT LONG TERM GOAL #4   Title Improve low back strength as assessed by FOTO scores and subjective self-report.    Baseline 45 minute sitting limit    Time 8    Period Weeks    Status On-going    Target Date 09/03/21      PT LONG TERM GOAL #5   Title Sandra Curtis will be independent with her long-term HEP at [place].    Time 8    Period Weeks    Status On-going    Target Date 09/03/21                   Plan - 07/30/21 1647     Clinical Impression Statement Sandra Curtis is making good early progress towards long-term goals.  Pain has improved and she is using less pain medication.  Although improved, body mechanics and core strength need work and will benefit from the recommended plan of care.  With another ~ 4 weeks of supervised PT, I expect Sandra Curtis to meet LTGs and be ready for independent rehabilitation with an updated HEP.    Personal Factors and Comorbidities Fitness;Comorbidity 3+    Comorbidities Pulmonary fibrosis, type 2 DM, lumbosacral DDD, lumbar scoliosis    Examination-Activity Limitations Sit;Sleep;Bed Mobility;Bend;Lift    Examination-Participation Restrictions Community Activity    Stability/Clinical Decision Making Stable/Uncomplicated    Rehab Potential Good    PT Frequency 2x / week    PT Duration 4 weeks    PT Treatment/Interventions ADLs/Self Care Home Management;Moist Heat;Electrical Stimulation;Cryotherapy;Traction;Therapeutic activities;Therapeutic exercise;Neuromuscular re-education;Patient/family education;Manual techniques;Dry needling    PT Next Visit Plan Progress low back and LE strength, practical body  mechanics, postural correction as needed    PT Home Exercise Plan [id number]    Consulted and Agree with Plan of Care Patient             Patient will benefit from skilled therapeutic intervention in order to improve the following deficits and impairments:  Decreased activity tolerance, Decreased endurance, Decreased range of motion, Decreased strength, Difficulty walking, Increased edema, Impaired flexibility, Increased muscle spasms, Postural dysfunction, Pain  Visit Diagnosis: Abnormal posture  Muscle weakness (generalized)  Chronic bilateral low back pain, unspecified whether sciatica present  Difficulty walking     Problem List Patient Active Problem List   Diagnosis Date Noted   Viral syndrome 05/20/2021   Atherosclerosis of aorta ([place]) 01/12/2021   Primary insomnia 01/12/2021   Positive ANA (antinuclear antibody) 09/09/2020   Pulmonary fibrosis ([place]) 06/26/2020   Chronic cough 05/13/2020   Atrophic vaginitis 01/09/2019   Menopausal flushing 01/09/2019   Colon polyp, hyperplastic 01/09/2019   Type 2 diabetes mellitus without complication, without long-term current use of insulin (HCC) 12/11/2017   Normocytic anemia 12/11/2017   Depressive disorder  02/06/2017   Gastroesophageal reflux disease with esophagitis 02/06/2017   DDD (degenerative disc disease), lumbosacral 02/06/2017   Dysphagia 02/06/2017   Scoliosis of lumbar spine 02/06/2017   Aortic regurgitation 10/02/2013   Headache 03/01/2012   Seasonal allergic rhinitis 03/01/2012    Farley Ly, PT, MPT 07/30/2021, 4:50 PM  [facility] Physical Therapy [address] [place], [place], [address] Phone: [phone]   Fax:  [phone]  Name: Sandra Curtis MRN: [id number] Date of Birth: 01-07-1956

## 2021-08-04 ENCOUNTER — Other Ambulatory Visit: Payer: Self-pay

## 2021-08-04 ENCOUNTER — Encounter: Payer: Self-pay | Admitting: Rehabilitative and Restorative Service Providers"

## 2021-08-04 ENCOUNTER — Ambulatory Visit (INDEPENDENT_AMBULATORY_CARE_PROVIDER_SITE_OTHER): Payer: BC Managed Care – PPO | Admitting: Rehabilitative and Restorative Service Providers"

## 2021-08-04 DIAGNOSIS — M545 Low back pain, unspecified: Secondary | ICD-10-CM

## 2021-08-04 DIAGNOSIS — R293 Abnormal posture: Secondary | ICD-10-CM

## 2021-08-04 DIAGNOSIS — R262 Difficulty in walking, not elsewhere classified: Secondary | ICD-10-CM

## 2021-08-04 DIAGNOSIS — M6281 Muscle weakness (generalized): Secondary | ICD-10-CM | POA: Diagnosis not present

## 2021-08-04 DIAGNOSIS — G8929 Other chronic pain: Secondary | ICD-10-CM

## 2021-08-04 NOTE — Therapy (Signed)
[place], [place], [address] Phone: [phone]   Fax:  [phone]  Physical Therapy Treatment  Patient Details  Name: Sandra Curtis MRN: [id number] Date of Birth: [birth year] Referring Provider (PT): Marybelle Killings MD   Encounter Date: 08/04/2021   PT End of Session - 08/04/21 1545     Visit Number 6    Number of Visits 16    Date for PT Re-Evaluation 09/02/21    Progress Note Due on Visit 15    PT Start Time 1660    PT Stop Time 1538    PT Time Calculation (min) 45 min    Activity Tolerance No increased pain;Patient tolerated treatment well    Behavior During Therapy [facility] for tasks assessed/performed             Past Medical History:  Diagnosis Date   Allergic rhinitis    Allergy    Chronic back pain 07/20/2018   Chronic midline low back pain 02/06/2017   Depression    Esophageal stricture    GERD (gastroesophageal reflux disease)    Inflammatory polyps of colon ([place]) 2010   Pulmonary filariasis (HCC)    Systolic murmur    UTI (urinary tract infection)     Past Surgical History:  Procedure Laterality Date   COLONOSCOPY     UPPER GASTROINTESTINAL ENDOSCOPY      There were no vitals filed for this visit.   Subjective Assessment - 08/04/21 1459     Subjective [name] is sleeping about 3 hours uninterrupted.  She is taking tylenol in the morning.    Pertinent History Pulmonary fibrosis, type 2 DM, lumbosacral DDD, lumbar scoliosis    Limitations Sitting;House hold activities;Lifting    How long can you sit comfortably? 45 minutes    How long can you stand comfortably? Depends on what she is doing (up to an hour)    How long can you walk comfortably? Due to breathing difficulties she does not walk for exercise    Patient Stated Goals Decrease low back pain    Currently in Pain? Yes    Pain Score 4     Pain Location Back    Pain Orientation Lower    Pain Descriptors / Indicators  Aching;Sore;Pressure    Pain Type Chronic pain    Pain Radiating Towards NA    Pain Onset More than a month ago    Pain Frequency Occasional    Aggravating Factors  Vacuuming, making beds, getting groceries up the stairs.    Pain Relieving Factors Rest and exercises    Effect of Pain on Daily Activities Limited endurance and has to watch body mechanics    Multiple Pain Sites No                               [facility] Adult PT Treatment/Exercise - 08/04/21 0001       Posture/Postural Control   Posture/Postural Control Postural limitations    Postural Limitations Flexed trunk      Exercises   Exercises Lumbar      Lumbar Exercises: Stretches   Single Knee to Chest Stretch Left;Right;4 reps;20 seconds    Single Knee to Chest Stretch Limitations Also with one leg hanging off the table 2X 20 seconds each side    Figure 4 Stretch 4 reps;20 seconds    Other Lumbar Stretch Exercise Standing hip/trunk extension (push hips forward) 10X 3 seconds  Other Lumbar Stretch Exercise Standing hip flexors stretch (1 foot in chair, on ball of back foot, spine straight with slight turn towards foot in chair) 2X 20 seconds      Lumbar Exercises: Standing   Scapular Retraction Strengthening;Both;10 reps;Limitations    Scapular Retraction Limitations 5 seconds (shoulder blade pinches)    Other Standing Lumbar Exercises Alternating hip hike 10X 3 seconds (in doorframe with pelvic stabilization)    Other Standing Lumbar Exercises Hip abduction with pelvic stabilization 5X for 3 seconds (hike and push) 2 sets      Lumbar Exercises: Seated   Sit to Stand 10 reps;Limitations    Sit to Stand Limitations No hands      Lumbar Exercises: Prone   Straight Leg Raise 10 reps;3 seconds;Limitations    Straight Leg Raises Limitations Forehead on forearms, toes back and keep knee straight 2 sets    Opposite Arm/Leg Raise Left arm/Right leg;Right arm/Left leg;10 reps;3 seconds;Limitations     Opposite Arm/Leg Raise Limitations 2 sets                     PT Education - 08/04/21 1544     Education Details Reviewed HEP with corrections and doubling of standing and prone strengthening    Person(s) Educated Patient    Methods Explanation;Demonstration;Tactile cues;Verbal cues    Comprehension Verbalized understanding;Tactile cues required;Need further instruction;Returned demonstration;Verbal cues required              PT Short Term Goals - 07/30/21 1645       PT SHORT TERM GOAL #1   Title Improve trunk extension AROM to 10 degrees.    Baseline 0 degrees    Time 4    Period Weeks    Status Achieved    Target Date 08/06/21      PT SHORT TERM GOAL #2   Title Tasmine will report low back pain consistently 0-4/10 on the Numeric Pain Rating Scale.    Baseline was 5-7/10    Time 4    Period Weeks    Status Achieved    Target Date 08/06/21               PT Long Term Goals - 07/30/21 1645       PT LONG TERM GOAL #1   Title Improve FOTO to 55.    Baseline 52 (Was 31)    Time 8    Period Weeks    Status On-going    Target Date 09/03/21      PT LONG TERM GOAL #2   Title Improve low back pain to consistently 0-3/10 on the Numeric Pain Rating Scale.    Baseline 3-4/10 (was 5-7/10)    Time 8    Period Weeks    Status On-going    Target Date 09/03/21      PT LONG TERM GOAL #3   Title Improve B hip flexors flexibility to 110 degrees and hip ER AROM to 40 degrees.    Baseline See objective    Time 8    Period Weeks    Status Partially Met    Target Date 09/03/21      PT LONG TERM GOAL #4   Title Improve low back strength as assessed by FOTO scores and subjective self-report.    Baseline 45 minute sitting limit    Time 8    Period Weeks    Status On-going    Target Date 09/03/21  PT LONG TERM GOAL #5   Title Jalaiyah will be independent with her long-term HEP at [place].    Time 8    Period Weeks    Status On-going    Target Date  09/03/21                   Plan - 08/04/21 1545     Clinical Impression Statement [name] was fatigued and did a great job with the increase in her core strength work today.  With improved leg and core strength (lumbar extensors, quadratus lumborum and hip abductors), [name] should meet remaining long-term goals.  Increased emphasis on practical body mechanics is also a focus next visit.    Personal Factors and Comorbidities Fitness;Comorbidity 3+    Comorbidities Pulmonary fibrosis, type 2 DM, lumbosacral DDD, lumbar scoliosis    Examination-Activity Limitations Sit;Sleep;Bed Mobility;Bend;Lift    Examination-Participation Restrictions Community Activity    Stability/Clinical Decision Making Stable/Uncomplicated    Rehab Potential Good    PT Frequency 2x / week    PT Duration 4 weeks    PT Treatment/Interventions ADLs/Self Care Home Management;Moist Heat;Electrical Stimulation;Cryotherapy;Traction;Therapeutic activities;Therapeutic exercise;Neuromuscular re-education;Patient/family education;Manual techniques;Dry needling    PT Next Visit Plan Continue to progress low back and LE strength, practical body mechanics, postural correction as needed    PT Home Exercise Plan [id number]    Consulted and Agree with Plan of Care Patient             Patient will benefit from skilled therapeutic intervention in order to improve the following deficits and impairments:  Decreased activity tolerance, Decreased endurance, Decreased range of motion, Decreased strength, Difficulty walking, Increased edema, Impaired flexibility, Increased muscle spasms, Postural dysfunction, Pain  Visit Diagnosis: Abnormal posture  Muscle weakness (generalized)  Chronic bilateral low back pain, unspecified whether sciatica present  Difficulty walking     Problem List Patient Active Problem List   Diagnosis Date Noted   Viral syndrome 05/20/2021   Atherosclerosis of aorta (HCC) 01/12/2021   Primary  insomnia 01/12/2021   Positive ANA (antinuclear antibody) 09/09/2020   Pulmonary fibrosis ([place]) 06/26/2020   Chronic cough 05/13/2020   Atrophic vaginitis 01/09/2019   Menopausal flushing 01/09/2019   Colon polyp, hyperplastic 01/09/2019   Type 2 diabetes mellitus without complication, without long-term current use of insulin (HCC) 12/11/2017   Normocytic anemia 12/11/2017   Depressive disorder 02/06/2017   Gastroesophageal reflux disease with esophagitis 02/06/2017   DDD (degenerative disc disease), lumbosacral 02/06/2017   Dysphagia 02/06/2017   Scoliosis of lumbar spine 02/06/2017   Aortic regurgitation 10/02/2013   Headache 03/01/2012   Seasonal allergic rhinitis 03/01/2012    Farley Ly, PT, MPT 08/04/2021, 3:48 PM  [facility] [address] [place], [place], [address] Phone: [phone]   Fax:  [phone]  Name: Vida Nicol MRN: [id number] Date of Birth: [birth year]

## 2021-08-04 NOTE — Patient Instructions (Signed)
Continue current HEP with doubling of hip hike and prone spine strengthening (double sets or do 1 more time/day)

## 2021-08-06 ENCOUNTER — Ambulatory Visit (INDEPENDENT_AMBULATORY_CARE_PROVIDER_SITE_OTHER): Payer: BC Managed Care – PPO | Admitting: Rehabilitative and Restorative Service Providers"

## 2021-08-06 ENCOUNTER — Other Ambulatory Visit: Payer: Self-pay

## 2021-08-06 ENCOUNTER — Encounter: Payer: Self-pay | Admitting: Rehabilitative and Restorative Service Providers"

## 2021-08-06 DIAGNOSIS — R262 Difficulty in walking, not elsewhere classified: Secondary | ICD-10-CM

## 2021-08-06 DIAGNOSIS — M6281 Muscle weakness (generalized): Secondary | ICD-10-CM

## 2021-08-06 DIAGNOSIS — M545 Low back pain, unspecified: Secondary | ICD-10-CM

## 2021-08-06 DIAGNOSIS — G8929 Other chronic pain: Secondary | ICD-10-CM

## 2021-08-06 DIAGNOSIS — R293 Abnormal posture: Secondary | ICD-10-CM

## 2021-08-06 NOTE — Patient Instructions (Signed)
Continue current HEP 

## 2021-08-06 NOTE — Therapy (Addendum)
[facility] Physical Therapy [address] [place], [place], [address] Phone: [phone]   Fax:  [phone]  Physical Therapy Treatment  Patient Details  Name: Sandra Curtis MRN: [id number] Date of Birth: 1956-04-12 Referring Provider (PT): Marybelle Killings MD  PHYSICAL THERAPY DISCHARGE SUMMARY  Visits from Start of Care: 7  Current functional level related to goals / functional outcomes: See note   Remaining deficits: See note   Education / Equipment: HEP   Patient agrees to discharge. Patient goals were partially met. Patient is being discharged due to being pleased with the current functional level.   Encounter Date: 08/06/2021   PT End of Session - 08/06/21 1356     Visit Number 7    Number of Visits 16    Date for PT Re-Evaluation 09/02/21    Progress Note Due on Visit 15    PT Start Time 1307    PT Stop Time 1351    PT Time Calculation (min) 44 min    Activity Tolerance No increased pain;Patient tolerated treatment well    Behavior During Therapy [facility] for tasks assessed/performed             Past Medical History:  Diagnosis Date   Allergic rhinitis    Allergy    Chronic back pain 07/20/2018   Chronic midline low back pain 02/06/2017   Depression    Esophageal stricture    GERD (gastroesophageal reflux disease)    Inflammatory polyps of colon ([place]) 2010   Pulmonary filariasis (HCC)    Systolic murmur    UTI (urinary tract infection)     Past Surgical History:  Procedure Laterality Date   COLONOSCOPY     UPPER GASTROINTESTINAL ENDOSCOPY      There were no vitals filed for this visit.   Subjective Assessment - 08/06/21 1311     Subjective Sandra Curtis is sleeping about 3 hours uninterrupted.  Due to breathing more than back pain.  Tylenol in the mornings.    Pertinent History Pulmonary fibrosis, type 2 DM, lumbosacral DDD, lumbar scoliosis    Limitations Sitting;House hold activities;Lifting    How long can you sit  comfortably? 45 minutes    How long can you stand comfortably? Depends on what she is doing (up to an hour)    How long can you walk comfortably? Due to breathing difficulties she does not walk for exercise    Patient Stated Goals Decrease low back pain    Currently in Pain? No/denies    Pain Score 0-No pain    Pain Location Back    Pain Orientation Lower    Pain Type Chronic pain    Pain Radiating Towards NA    Pain Onset More than a month ago    Pain Frequency Occasional    Aggravating Factors  Vacuuming, making beds, getting groceries up the stairs    Pain Relieving Factors Rest and exercises    Effect of Pain on Daily Activities Endurance is improving as strength improves but is limited    Multiple Pain Sites No                               [facility] Adult PT Treatment/Exercise - 08/06/21 0001       Posture/Postural Control   Posture/Postural Control Postural limitations    Postural Limitations Flexed trunk      Exercises   Exercises Lumbar      Lumbar Exercises:  Stretches   Single Knee to Chest Stretch Left;Right;4 reps;20 seconds    Single Knee to Chest Stretch Limitations Also with one leg hanging off the table 2X 20 seconds each side    Figure 4 Stretch 4 reps;20 seconds    Other Lumbar Stretch Exercise Standing hip/trunk extension (push hips forward) 10X 3 seconds    Other Lumbar Stretch Exercise Standing hip flexors stretch (1 foot in chair, on ball of back foot, spine straight with slight turn towards foot in chair) 2X 20 seconds      Lumbar Exercises: Standing   Scapular Retraction Strengthening;Both;10 reps;Limitations    Scapular Retraction Limitations 5 seconds (shoulder blade pinches)    Other Standing Lumbar Exercises Alternating hip hike 10X 3 seconds (in doorframe with pelvic stabilization)    Other Standing Lumbar Exercises Hip abduction with pelvic stabilization 5X for 3 seconds (hike and push) 2 sets      Lumbar Exercises: Seated   Sit  to Stand 10 reps;Limitations    Sit to Stand Limitations No hands      Lumbar Exercises: Prone   Straight Leg Raise 10 reps;5 seconds;Limitations    Straight Leg Raises Limitations Forehead on forearms, toes back and keep knee straight 2 sets    Opposite Arm/Leg Raise Left arm/Right leg;Right arm/Left leg;10 reps;3 seconds;Limitations    Opposite Arm/Leg Raise Limitations 2 sets                     PT Education - 08/06/21 1355     Education Details Spent time reviewing practical body mechanics and corrections with hip abductors and quadratus lumborum strengthening.    Person(s) Educated Patient    Methods Explanation;Demonstration;Verbal cues    Comprehension Verbalized understanding;Need further instruction;Returned demonstration;Verbal cues required              PT Short Term Goals - 07/30/21 1645       PT SHORT TERM GOAL #1   Title Improve trunk extension AROM to 10 degrees.    Baseline 0 degrees    Time 4    Period Weeks    Status Achieved    Target Date 08/06/21      PT SHORT TERM GOAL #2   Title Sandra Curtis will report low back pain consistently 0-4/10 on the Numeric Pain Rating Scale.    Baseline was 5-7/10    Time 4    Period Weeks    Status Achieved    Target Date 08/06/21               PT Long Term Goals - 07/30/21 1645       PT LONG TERM GOAL #1   Title Improve FOTO to 55.    Baseline 52 (Was 31)    Time 8    Period Weeks    Status On-going    Target Date 09/03/21      PT LONG TERM GOAL #2   Title Improve low back pain to consistently 0-3/10 on the Numeric Pain Rating Scale.    Baseline 3-4/10 (was 5-7/10)    Time 8    Period Weeks    Status On-going    Target Date 09/03/21      PT LONG TERM GOAL #3   Title Improve B hip flexors flexibility to 110 degrees and hip ER AROM to 40 degrees.    Baseline See objective    Time 8    Period Weeks    Status Partially Met  Target Date 09/03/21      PT LONG TERM GOAL #4   Title  Improve low back strength as assessed by FOTO scores and subjective self-report.    Baseline 45 minute sitting limit    Time 8    Period Weeks    Status On-going    Target Date 09/03/21      PT LONG TERM GOAL #5   Title Sandra Curtis will be independent with her long-term HEP at DC.    Time 8    Period Weeks    Status On-going    Target Date 09/03/21                   Plan - 08/06/21 1356     Clinical Impression Statement Sandra Curtis has had significantly less back pain the past 2 days.  She also reports good compliance with her home strengthening program.  She did a good job with Clinical cytogeneticist and she reports doing a good job with this at home.  Continue strength emphasis to meet all LTGs and prepare for independent rehabilitation.    Personal Factors and Comorbidities Fitness;Comorbidity 3+    Comorbidities Pulmonary fibrosis, type 2 DM, lumbosacral DDD, lumbar scoliosis    Examination-Activity Limitations Sit;Sleep;Bed Mobility;Bend;Lift    Examination-Participation Restrictions Community Activity    Stability/Clinical Decision Making Stable/Uncomplicated    Rehab Potential Good    PT Frequency 2x / week    PT Duration 4 weeks    PT Treatment/Interventions ADLs/Self Care Home Management;Moist Heat;Electrical Stimulation;Cryotherapy;Traction;Therapeutic activities;Therapeutic exercise;Neuromuscular re-education;Patient/family education;Manual techniques;Dry needling    PT Next Visit Plan Back and leg strength work to prepare for DC    PT [place] and Agree with Plan of Care Patient             Patient will benefit from skilled therapeutic intervention in order to improve the following deficits and impairments:  Decreased activity tolerance, Decreased endurance, Decreased range of motion, Decreased strength, Difficulty walking, Increased edema, Impaired flexibility, Increased muscle spasms, Postural dysfunction, Pain  Visit  Diagnosis: Abnormal posture  Muscle weakness (generalized)  Chronic bilateral low back pain, unspecified whether sciatica present  Difficulty walking     Problem List Patient Active Problem List   Diagnosis Date Noted   Viral syndrome 05/20/2021   Atherosclerosis of aorta ([place]) 01/12/2021   Primary insomnia 01/12/2021   Positive ANA (antinuclear antibody) 09/09/2020   Pulmonary fibrosis ([place]) 06/26/2020   Chronic cough 05/13/2020   Atrophic vaginitis 01/09/2019   Menopausal flushing 01/09/2019   Colon polyp, hyperplastic 01/09/2019   Type 2 diabetes mellitus without complication, without long-term current use of insulin ([place]) 12/11/2017   Normocytic anemia 12/11/2017   Depressive disorder 02/06/2017   Gastroesophageal reflux disease with esophagitis 02/06/2017   DDD (degenerative disc disease), lumbosacral 02/06/2017   Dysphagia 02/06/2017   Scoliosis of lumbar spine 02/06/2017   Aortic regurgitation 10/02/2013   Headache 03/01/2012   Seasonal allergic rhinitis 03/01/2012    Farley Ly, PT, MPT 08/06/2021, 1:58 PM  [facility] Physical Therapy [address] [place], [place], [address] Phone: [phone]   Fax:  [phone]  Name: Sandra Curtis MRN: [id number] Date of Birth: [birth year]

## 2021-08-10 ENCOUNTER — Ambulatory Visit (INDEPENDENT_AMBULATORY_CARE_PROVIDER_SITE_OTHER): Payer: BC Managed Care – PPO | Admitting: Orthopaedic Surgery

## 2021-08-10 ENCOUNTER — Other Ambulatory Visit: Payer: Self-pay

## 2021-08-10 ENCOUNTER — Encounter: Payer: Self-pay | Admitting: Orthopaedic Surgery

## 2021-08-10 VITALS — BP 114/67 | Ht 60.0 in | Wt 146.0 lb

## 2021-08-10 DIAGNOSIS — M5137 Other intervertebral disc degeneration, lumbosacral region: Secondary | ICD-10-CM | POA: Diagnosis not present

## 2021-08-10 DIAGNOSIS — M419 Scoliosis, unspecified: Secondary | ICD-10-CM | POA: Diagnosis not present

## 2021-08-10 NOTE — Progress Notes (Signed)
Office Visit Note   Patient: Sandra Curtis           Date of Birth: [birth year]           MRN: [id number] Visit Date: 08/10/2021              Requested by: [name], NP [address] [place],  [place] [address] PCP: [name], NP   Assessment & Plan: Visit Diagnoses:  1. Scoliosis of lumbar spine, unspecified scoliosis type   2. DDD (degenerative disc disease), lumbosacral     Plan: Patient has back pain no radicular symptoms scoliosis present foraminal stenosis present L5-S1.  In absence of nerve root tension signs and no weakness and no claudication symptoms she can continue with a walking program core strengthening program and follow-up if she has increased symptoms.  Follow-Up Instructions: Return if symptoms worsen or fail to improve.   Orders:  No orders of the defined types were placed in this encounter.  No orders of the defined types were placed in this encounter.     Procedures: No procedures performed   Clinical Data: No additional findings.   Subjective: Chief Complaint  Patient presents with   Lower Back - Follow-up    HPI follow-up L1 compression fracture from 2019.  Patient's not working she states she still has soreness and increased discomfort with turning twisting or repetitive bending.  She denies claudication symptoms.  Previous MRI showed some foraminal narrowing worse on the left than the right at L5-S1.  Patient is used ibuprofen and Tylenol for pain.  She does have type 2 diabetes and not on insulin.  No associated bowel or bladder symptoms.  Patient has pulmonary fibrosis   Review of Systems all other systems updated unchanged from 06/29/2021 office visit.   Objective: Vital Signs: BP 114/67   Ht 5' (1.524 m)   Wt 146 lb (66.2 kg)   BMI 28.51 kg/m   Physical Exam Constitutional:      Appearance: She is well-developed.  HENT:     Head: Normocephalic.     Right Ear: External ear normal.     Left  Ear: External ear normal. There is no impacted cerumen.  Eyes:     Pupils: Pupils are equal, round, and reactive to light.  Neck:     Thyroid: No thyromegaly.     Trachea: No tracheal deviation.  Cardiovascular:     Rate and Rhythm: Normal rate.  Pulmonary:     Effort: Pulmonary effort is normal.  Abdominal:     Palpations: Abdomen is soft.  Musculoskeletal:     Cervical back: No rigidity.  Skin:    General: Skin is warm and dry.  Neurological:     Mental Status: She is alert and oriented to person, place, and time.  Psychiatric:        Behavior: Behavior normal.    Ortho Exam lumbar scoliosis.  Pelvis is level.  Negative logroll of the hips.  Good hip flexion quad strength anterior tib EHL is strong.  Knees reach full extension.  Normal heel toe gait.  Specialty Comments:  No specialty comments available.  Imaging: MM DIAG BREAST TOMO UNI LEFT  Result Date: 08/12/2021 CLINICAL DATA:  65 year old female for six-month follow-up of benign LEFT breast biopsy demonstrating PASH. EXAM: DIGITAL DIAGNOSTIC UNILATERAL LEFT MAMMOGRAM WITH TOMOSYNTHESIS AND CAD TECHNIQUE: Left digital diagnostic mammography and breast tomosynthesis was performed. The images were evaluated with computer-aided detection. COMPARISON:  Previous exam(s). ACR  Breast Density Category c: The breast tissue is heterogeneously dense, which may obscure small masses. FINDINGS: Full field views of the LEFT breast demonstrate an X biopsy clip at the site of biopsied PASH. No new or suspicious abnormalities within the LEFT breast identified. IMPRESSION: No new or suspicious abnormalities within the LEFT breast. RECOMMENDATION: Bilateral screening mammogram in 6 months to resume annual mammogram schedule. I have discussed the findings and recommendations with the patient. If applicable, a reminder letter will be sent to the patient regarding the next appointment. BI-RADS CATEGORY  2: Benign. Electronically Signed   By: Harmon Pier M.D.   On: 08/12/2021 11:29    PMFS History: Patient Active Problem List   Diagnosis Date Noted   Viral syndrome 05/20/2021   Atherosclerosis of aorta (HCC) 01/12/2021   Primary insomnia 01/12/2021   Positive ANA (antinuclear antibody) 09/09/2020   Pulmonary fibrosis (HCC) 06/26/2020   Chronic cough 05/13/2020   Atrophic vaginitis 01/09/2019   Menopausal flushing 01/09/2019   Colon polyp, hyperplastic 01/09/2019   Type 2 diabetes mellitus without complication, without long-term current use of insulin (HCC) 12/11/2017   Normocytic anemia 12/11/2017   Depressive disorder 02/06/2017   Gastroesophageal reflux disease with esophagitis 02/06/2017   DDD (degenerative disc disease), lumbosacral 02/06/2017   Dysphagia 02/06/2017   Scoliosis of lumbar spine 02/06/2017   Aortic regurgitation 10/02/2013   Headache 03/01/2012   Seasonal allergic rhinitis 03/01/2012   Past Medical History:  Diagnosis Date   Allergic rhinitis    Allergy    Chronic back pain 07/20/2018   Chronic midline low back pain 02/06/2017   Depression    Esophageal stricture    GERD (gastroesophageal reflux disease)    Inflammatory polyps of colon (HCC) 2010   Pulmonary filariasis (HCC)    Systolic murmur    UTI (urinary tract infection)     Family History  Problem Relation Age of Onset   Arthritis Mother    Thyroid disease Mother    Heart disease Father    Hypertension Father    Diabetes Father    Arthritis Sister    Diabetes Brother    Colon cancer Neg Hx    Stomach cancer Neg Hx    Rectal cancer Neg Hx    Esophageal cancer Neg Hx     Past Surgical History:  Procedure Laterality Date   BREAST BIOPSY Left 01/22/2021   COLONOSCOPY     UPPER GASTROINTESTINAL ENDOSCOPY     Social History   Occupational History   Occupation: Biomedical engineer: paperworks  Tobacco Use   Smoking status: Former    Packs/day: 0.50    Years: 35.00    Pack years: 17.50    Types: Cigarettes    Quit date:  04/12/2018    Years since quitting: 3.3   Smokeless tobacco: Never  Vaping Use   Vaping Use: Never used  Substance and Sexual Activity   Alcohol use: Not Currently   Drug use: No   Sexual activity: Not Currently

## 2021-08-11 ENCOUNTER — Other Ambulatory Visit: Payer: Self-pay | Admitting: Nurse Practitioner

## 2021-08-11 ENCOUNTER — Encounter: Payer: Self-pay | Admitting: Nurse Practitioner

## 2021-08-11 DIAGNOSIS — K2100 Gastro-esophageal reflux disease with esophagitis, without bleeding: Secondary | ICD-10-CM

## 2021-08-11 DIAGNOSIS — R053 Chronic cough: Secondary | ICD-10-CM

## 2021-08-11 MED ORDER — ALBUTEROL SULFATE HFA 108 (90 BASE) MCG/ACT IN AERS: 1.0000 | INHALATION_SPRAY | Freq: Four times a day (QID) | RESPIRATORY_TRACT | 0 refills | Status: DC | PRN

## 2021-08-12 ENCOUNTER — Ambulatory Visit: Payer: BC Managed Care – PPO

## 2021-08-12 ENCOUNTER — Ambulatory Visit
Admission: RE | Admit: 2021-08-12 | Discharge: 2021-08-12 | Disposition: A | Discharge status: Home / Self Care | Payer: BC Managed Care – PPO | Source: Ambulatory Visit | Attending: Obstetrics | Admitting: Obstetrics

## 2021-08-12 ENCOUNTER — Other Ambulatory Visit: Payer: Self-pay

## 2021-08-12 DIAGNOSIS — R928 Other abnormal and inconclusive findings on diagnostic imaging of breast: Secondary | ICD-10-CM

## 2021-08-15 ENCOUNTER — Other Ambulatory Visit: Payer: Self-pay | Admitting: Nurse Practitioner

## 2021-08-15 DIAGNOSIS — U071 COVID-19: Secondary | ICD-10-CM

## 2021-08-15 DIAGNOSIS — R0981 Nasal congestion: Secondary | ICD-10-CM

## 2021-08-24 ENCOUNTER — Other Ambulatory Visit: Payer: Self-pay | Admitting: Pulmonary Disease

## 2021-08-24 DIAGNOSIS — J84112 Idiopathic pulmonary fibrosis: Secondary | ICD-10-CM

## 2021-08-24 DIAGNOSIS — Z5181 Encounter for therapeutic drug level monitoring: Secondary | ICD-10-CM

## 2021-08-25 NOTE — Telephone Encounter (Signed)
Refill sent for OFEV to [facility]: [phone]  x 1 month only as patient needs labs  Dose: 100 mg twice daily  Last OV: 07/13/21 Provider: Dr. Isaiah Serge  LFTs on 07/13/21 wnl. Patient due for repeat CMET and CBC  per note from Dr. Isaiah Serge on 07/15/21. Left VM for patient advising of lab hours.  Next OV: not yet scheduled but due after repeat PFTs and HRCT.  Chesley Mires, PharmD, MPH, BCPS Clinical Pharmacist (Rheumatology and Pulmonology)

## 2021-08-26 NOTE — Telephone Encounter (Signed)
Sandra Curtis, please advise on update and if this can be closed. Thanks.

## 2021-08-26 NOTE — Telephone Encounter (Signed)
Yes, this can be closed.  Disability forms were faxed to insurance on 06/11/2021.

## 2021-08-27 ENCOUNTER — Encounter: Payer: Self-pay | Admitting: Pulmonary Disease

## 2021-08-27 ENCOUNTER — Other Ambulatory Visit (INDEPENDENT_AMBULATORY_CARE_PROVIDER_SITE_OTHER): Payer: BC Managed Care – PPO

## 2021-08-27 DIAGNOSIS — J84112 Idiopathic pulmonary fibrosis: Secondary | ICD-10-CM

## 2021-08-27 DIAGNOSIS — Z5181 Encounter for therapeutic drug level monitoring: Secondary | ICD-10-CM | POA: Diagnosis not present

## 2021-08-27 LAB — COMPREHENSIVE METABOLIC PANEL
ALT: 19 U/L (ref 0–35)
AST: 32 U/L (ref 0–37)
Albumin: 4.7 g/dL (ref 3.5–5.2)
Alkaline Phosphatase: 50 U/L (ref 39–117)
BUN: 19 mg/dL (ref 6–23)
CO2: 25 mEq/L (ref 19–32)
Calcium: 9.5 mg/dL (ref 8.4–10.5)
Chloride: 103 mEq/L (ref 96–112)
Creatinine, Ser: 1.37 mg/dL — ABNORMAL HIGH (ref 0.40–1.20)
GFR: 40.54 mL/min — ABNORMAL LOW (ref >60.00)
Glucose, Bld: 92 mg/dL (ref 70–99)
Potassium: 3.9 mEq/L (ref 3.5–5.1)
Sodium: 137 mEq/L (ref 135–145)
Total Bilirubin: 0.4 mg/dL (ref 0.2–1.2)
Total Protein: 8.3 g/dL (ref 6.0–8.3)

## 2021-08-27 LAB — CBC WITH DIFFERENTIAL/PLATELET
Basophils Absolute: 0 10*3/uL (ref 0.0–0.1)
Basophils Relative: 0.6 % (ref 0.0–3.0)
Eosinophils Absolute: 0.2 10*3/uL (ref 0.0–0.7)
Eosinophils Relative: 2.9 % (ref 0.0–5.0)
HCT: 32.3 % — ABNORMAL LOW (ref 36.0–46.0)
Hemoglobin: 10.5 g/dL — ABNORMAL LOW (ref 12.0–15.0)
Lymphocytes Relative: 43.9 % (ref 12.0–46.0)
Lymphs Abs: 3 10*3/uL (ref 0.7–4.0)
MCHC: 32.6 g/dL (ref 30.0–36.0)
MCV: 93.2 fl (ref 78.0–100.0)
Monocytes Absolute: 0.7 10*3/uL (ref 0.1–1.0)
Monocytes Relative: 10.3 % (ref 3.0–12.0)
Neutro Abs: 2.9 10*3/uL (ref 1.4–7.7)
Neutrophils Relative %: 42.3 % — ABNORMAL LOW (ref 43.0–77.0)
Platelets: 251 10*3/uL (ref 150.0–400.0)
RBC: 3.46 Mil/uL — ABNORMAL LOW (ref 3.87–5.11)
RDW: 13.8 % (ref 11.5–15.5)
WBC: 6.8 10*3/uL (ref 4.0–10.5)

## 2021-08-28 NOTE — Telephone Encounter (Signed)
PM please advise on the results of the labs.   thanks

## 2021-08-30 ENCOUNTER — Other Ambulatory Visit: Payer: Self-pay | Admitting: *Deleted

## 2021-08-30 DIAGNOSIS — N289 Disorder of kidney and ureter, unspecified: Secondary | ICD-10-CM

## 2021-08-30 NOTE — Telephone Encounter (Signed)
Blood counts and liver test are normal Her kidneys seem a little weaker compared to last month.  Please make referral to nephrology for evaluation

## 2021-09-08 NOTE — Telephone Encounter (Signed)
Chart supports Rx Last seen 04/2021 No f/u appointment scheduled.

## 2021-09-18 ENCOUNTER — Other Ambulatory Visit: Payer: Self-pay | Admitting: Pulmonary Disease

## 2021-09-18 DIAGNOSIS — J84112 Idiopathic pulmonary fibrosis: Secondary | ICD-10-CM

## 2021-09-21 ENCOUNTER — Other Ambulatory Visit: Payer: Self-pay | Admitting: Nurse Practitioner

## 2021-09-21 DIAGNOSIS — K2100 Gastro-esophageal reflux disease with esophagitis, without bleeding: Secondary | ICD-10-CM

## 2021-09-21 DIAGNOSIS — R053 Chronic cough: Secondary | ICD-10-CM

## 2021-09-23 ENCOUNTER — Other Ambulatory Visit: Payer: Self-pay | Admitting: Nurse Practitioner

## 2021-09-23 DIAGNOSIS — R053 Chronic cough: Secondary | ICD-10-CM

## 2021-09-23 DIAGNOSIS — K2100 Gastro-esophageal reflux disease with esophagitis, without bleeding: Secondary | ICD-10-CM

## 2021-09-23 NOTE — Telephone Encounter (Signed)
Chart supports Rx 

## 2021-09-27 NOTE — Telephone Encounter (Signed)
Duplicate request

## 2021-10-13 ENCOUNTER — Inpatient Hospital Stay: Admission: RE | Admit: 2021-10-13 | Payer: BC Managed Care – PPO | Source: Ambulatory Visit

## 2021-10-14 ENCOUNTER — Other Ambulatory Visit: Payer: Self-pay | Admitting: Pulmonary Disease

## 2021-10-14 DIAGNOSIS — J84112 Idiopathic pulmonary fibrosis: Secondary | ICD-10-CM

## 2021-10-20 ENCOUNTER — Other Ambulatory Visit: Payer: BC Managed Care – PPO

## 2021-10-29 ENCOUNTER — Telehealth: Payer: Self-pay | Admitting: Pulmonary Disease

## 2021-10-29 ENCOUNTER — Other Ambulatory Visit: Payer: Self-pay

## 2021-10-29 DIAGNOSIS — Z5181 Encounter for therapeutic drug level monitoring: Secondary | ICD-10-CM

## 2021-10-29 NOTE — Telephone Encounter (Signed)
Scheduled CMP for monthly intervals per drs orders. Nothing further needed

## 2021-10-29 NOTE — Telephone Encounter (Signed)
Please order comprehensive metabolic panel

## 2021-10-29 NOTE — Telephone Encounter (Signed)
Called patient and she stated that she missed her monthly lab work and I looked in chart and she normally gets a liver panel checked.I told her that I would message the doctor to conform what labs he would like ordered and how often  Would you just like a Hepatic panel or would you like additional labs drawn?   MD Mannam please advise

## 2021-11-02 ENCOUNTER — Other Ambulatory Visit: Payer: Self-pay

## 2021-11-02 ENCOUNTER — Ambulatory Visit (INDEPENDENT_AMBULATORY_CARE_PROVIDER_SITE_OTHER)
Admission: RE | Admit: 2021-11-02 | Discharge: 2021-11-02 | Disposition: A | Discharge status: Home / Self Care | Payer: BC Managed Care – PPO | Source: Ambulatory Visit | Attending: Pulmonary Disease | Admitting: Pulmonary Disease

## 2021-11-02 DIAGNOSIS — J849 Interstitial pulmonary disease, unspecified: Secondary | ICD-10-CM | POA: Diagnosis not present

## 2021-11-03 ENCOUNTER — Encounter: Payer: Self-pay | Admitting: Pulmonary Disease

## 2021-11-10 ENCOUNTER — Other Ambulatory Visit: Payer: Self-pay

## 2021-11-10 ENCOUNTER — Ambulatory Visit: Payer: Self-pay

## 2021-11-10 ENCOUNTER — Ambulatory Visit (INDEPENDENT_AMBULATORY_CARE_PROVIDER_SITE_OTHER): Payer: BC Managed Care – PPO | Admitting: Orthopaedic Surgery

## 2021-11-10 ENCOUNTER — Other Ambulatory Visit: Payer: Self-pay | Admitting: Nephrology

## 2021-11-10 ENCOUNTER — Encounter: Payer: Self-pay | Admitting: Orthopaedic Surgery

## 2021-11-10 VITALS — BP 124/76 | HR 93 | Ht 60.0 in | Wt 146.0 lb

## 2021-11-10 DIAGNOSIS — M545 Low back pain, unspecified: Secondary | ICD-10-CM | POA: Diagnosis not present

## 2021-11-10 DIAGNOSIS — M25511 Pain in right shoulder: Secondary | ICD-10-CM | POA: Diagnosis not present

## 2021-11-10 DIAGNOSIS — R7989 Other specified abnormal findings of blood chemistry: Secondary | ICD-10-CM

## 2021-11-10 DIAGNOSIS — G8929 Other chronic pain: Secondary | ICD-10-CM | POA: Diagnosis not present

## 2021-11-10 MED ORDER — TRAMADOL HCL 50 MG PO TABS
50.0000 mg | ORAL_TABLET | Freq: Two times a day (BID) | ORAL | 0 refills | Status: DC | PRN
Start: 2021-11-10 — End: 2022-06-17

## 2021-11-10 NOTE — Progress Notes (Signed)
Office Visit Note   Patient: Sandra Curtis           Date of Birth: [birth year]           MRN: [id number] Visit Date: 11/10/2021              Requested by: [name], NP [address] [place],  [place] [address] PCP: [name], NP   Assessment & Plan: Visit Diagnoses:  1. Acute pain of right shoulder   2. Chronic bilateral low back pain, unspecified whether sciatica present     Plan: X-rays negative for acute changes.  Ultram 50 tablets no refills prescribed she can use this if needed.  We offered her a return appointment during therapy and she will call if she like to proceed.  No evidence of rotator cuff tear and radiographs show no acute bony injury.  We discussed that her soreness should resolve with time.  Follow-Up Instructions: No follow-ups on file.   Orders:  Orders Placed This Encounter  Procedures   XR Shoulder Right   XR Lumbar Spine 2-3 Views   No orders of the defined types were placed in this encounter.     Procedures: No procedures performed   Clinical Data: No additional findings.   Subjective: Chief Complaint  Patient presents with   Right Shoulder - Pain   Lower Back - Pain    HPI 66 year old female fell 4 days ago at [facility] she states she slipped on something on the floor she believes landed on her right shoulder right side.  States she has had pain with shoulder range of motion but is able to get her arm up overhead.  She had pain laterally over her right hip she has known scoliosis.  She been through therapy in the past which helped her back symptoms.  No associated bowel or bladder symptoms she states she does not have anything for pain other than Tylenol and its not working.  Review of Systems positive history of pulmonary fibrosis well with old L1 compression fracture.  Other systems noncontributory HPI.   Objective: Vital Signs: BP 124/76    Pulse 93    Ht 5' (1.524 m)    Wt 146 lb (66.2 kg)    BMI  28.51 kg/m   Physical Exam Constitutional:      Appearance: She is well-developed.  HENT:     Head: Normocephalic.     Right Ear: External ear normal.     Left Ear: External ear normal. There is no impacted cerumen.  Eyes:     Pupils: Pupils are equal, round, and reactive to light.  Neck:     Thyroid: No thyromegaly.     Trachea: No tracheal deviation.  Cardiovascular:     Rate and Rhythm: Normal rate.  Pulmonary:     Effort: Pulmonary effort is normal.  Abdominal:     Palpations: Abdomen is soft.  Musculoskeletal:     Cervical back: No rigidity.  Skin:    General: Skin is warm and dry.  Neurological:     Mental Status: She is alert and oriented to person, place, and time.  Psychiatric:        Behavior: Behavior normal.    Ortho Exam skin over her hip and back and shoulder are normal no ecchymosis.  Mild positive impingement right shoulder negative drop arm test Long head biceps is normal good cervical range of motion.  She has some tenderness over the trochanter some proximally  and anterior to the trochanter.  Normal hip range of motion.  Specialty Comments:  No specialty comments available.  Imaging: No results found.   PMFS History: Patient Active Problem List   Diagnosis Date Noted   Viral syndrome 05/20/2021   Atherosclerosis of aorta (HCC) 01/12/2021   Primary insomnia 01/12/2021   Positive ANA (antinuclear antibody) 09/09/2020   Pulmonary fibrosis (HCC) 06/26/2020   Chronic cough 05/13/2020   Atrophic vaginitis 01/09/2019   Menopausal flushing 01/09/2019   Colon polyp, hyperplastic 01/09/2019   Type 2 diabetes mellitus without complication, without long-term current use of insulin (HCC) 12/11/2017   Normocytic anemia 12/11/2017   Depressive disorder 02/06/2017   Gastroesophageal reflux disease with esophagitis 02/06/2017   DDD (degenerative disc disease), lumbosacral 02/06/2017   Dysphagia 02/06/2017   Scoliosis of lumbar spine 02/06/2017   Aortic  regurgitation 10/02/2013   Headache 03/01/2012   Seasonal allergic rhinitis 03/01/2012   Past Medical History:  Diagnosis Date   Allergic rhinitis    Allergy    Chronic back pain 07/20/2018   Chronic midline low back pain 02/06/2017   Depression    Esophageal stricture    GERD (gastroesophageal reflux disease)    Inflammatory polyps of colon (HCC) 2010   Pulmonary filariasis (HCC)    Systolic murmur    UTI (urinary tract infection)     Family History  Problem Relation Age of Onset   Arthritis Mother    Thyroid disease Mother    Heart disease Father    Hypertension Father    Diabetes Father    Arthritis Sister    Diabetes Brother    Colon cancer Neg Hx    Stomach cancer Neg Hx    Rectal cancer Neg Hx    Esophageal cancer Neg Hx     Past Surgical History:  Procedure Laterality Date   BREAST BIOPSY Left 01/22/2021   COLONOSCOPY     UPPER GASTROINTESTINAL ENDOSCOPY     Social History   Occupational History   Occupation: Biomedical engineer: paperworks  Tobacco Use   Smoking status: Former    Packs/day: 0.50    Years: 35.00    Pack years: 17.50    Types: Cigarettes    Quit date: 04/12/2018    Years since quitting: 3.5   Smokeless tobacco: Never  Vaping Use   Vaping Use: Never used  Substance and Sexual Activity   Alcohol use: Not Currently   Drug use: No   Sexual activity: Not Currently

## 2021-11-14 ENCOUNTER — Other Ambulatory Visit: Payer: Self-pay | Admitting: Pulmonary Disease

## 2021-11-14 DIAGNOSIS — J84112 Idiopathic pulmonary fibrosis: Secondary | ICD-10-CM

## 2021-11-15 ENCOUNTER — Other Ambulatory Visit (HOSPITAL_COMMUNITY): Payer: Self-pay

## 2021-11-15 NOTE — Telephone Encounter (Signed)
Refill sent for [facility] to [facility]: [phone] . Refill sent for one month only since she has appt with Dr. Isaiah Serge on 11/23/21  Dose: 100 mg twice daily  Last OV: 07/13/21 Provider: Dr. Isaiah Serge  Next OV: 11/23/21 Last CMP on 08/27/21 - creatinine high at 1.37. LFTs wnl  CMP Latest Ref Rng & Units 08/27/2021 07/13/2021 06/01/2021  Glucose 70 - 99 mg/dL 92 84 -  BUN 6 - 23 mg/dL 19 20 -  Creatinine 3.71 - 1.20 mg/dL 0.62(I) 9.48 -  Sodium 135 - 145 mEq/L 137 137 -  Potassium 3.5 - 5.1 mEq/L 3.9 4.2 -  Chloride 96 - 112 mEq/L 103 103 -  CO2 19 - 32 mEq/L 25 28 -  Calcium 8.4 - 10.5 mg/dL 9.5 9.5 -  Total Protein 6.0 - 8.3 g/dL 8.3 8.0 8.2  Total Bilirubin 0.2 - 1.2 mg/dL 0.4 0.3 0.3  Alkaline Phos 39 - 117 U/L 50 47 58  AST 0 - 37 U/L 32 29 36  ALT 0 - 35 U/L 19 17 22      , PharmD, MPH, BCPS Clinical Pharmacist (Rheumatology and Pulmonology)

## 2021-11-18 ENCOUNTER — Ambulatory Visit
Admission: RE | Admit: 2021-11-18 | Discharge: 2021-11-18 | Disposition: A | Discharge status: Home / Self Care | Payer: BC Managed Care – PPO | Source: Ambulatory Visit | Attending: Nephrology | Admitting: Nephrology

## 2021-11-18 ENCOUNTER — Other Ambulatory Visit: Payer: Self-pay

## 2021-11-18 DIAGNOSIS — R7989 Other specified abnormal findings of blood chemistry: Secondary | ICD-10-CM

## 2021-11-23 ENCOUNTER — Encounter: Payer: Self-pay | Admitting: Pulmonary Disease

## 2021-11-23 ENCOUNTER — Other Ambulatory Visit: Payer: Self-pay

## 2021-11-23 ENCOUNTER — Ambulatory Visit (INDEPENDENT_AMBULATORY_CARE_PROVIDER_SITE_OTHER): Payer: BC Managed Care – PPO | Admitting: Pulmonary Disease

## 2021-11-23 VITALS — BP 106/60 | HR 89 | Temp 98.4°F | Ht 60.0 in | Wt 146.2 lb

## 2021-11-23 DIAGNOSIS — J84112 Idiopathic pulmonary fibrosis: Secondary | ICD-10-CM | POA: Diagnosis not present

## 2021-11-23 DIAGNOSIS — Z5181 Encounter for therapeutic drug level monitoring: Secondary | ICD-10-CM

## 2021-11-23 NOTE — Progress Notes (Signed)
Sandra Curtis    [id number]    [birth year]  Primary Care Physician:Nche, Bonna Gains, NP  Referring Physician: [name], NP [address] [place],  [place] [address]  Chief complaint: Follow-up for IPF Did not tolerate [name] April 2022  HPI: 66 year old with history of allergies, GERD, esophageal stricture Complains of chronic cough for several years, worsening dyspnea on exertion. She had a CT scan done by Dr. Elease Etienne, primary care showing pulmonary fibrosis and has been referred for further evaluation.  Previously followed by Dr. Russella Dar, GI with EGD in 2004 showing esophageal stricture secondary to acid reflux.  She has not followed up since then. Started on Nexium in August 2021 by primary care.  Has followed up with Dr. Russella Dar, GI who has increased her Protonix to 40 mg twice daily and scheduled EGD Has also seen Dr. Dimple Casey, rheumatology for evaluation of forced of ANA and SSA.  He feels she does not have any signs of autoimmune disease.  Started Esbriet in December 2021.  But did not tolerate even at a low dose due to dizziness and headache. 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  No mold, hot tub, Jacuzzi.  No feather pillows or comforters Smoking history: 18-pack-year smoker.  Quit 1 month ago Travel history: No significant travel history Relevant family history: No significant family issue of lung disease  Interval history: Continues on Ofev 100 mg twice daily.  She is tolerating this well States her dyspnea on exertion is unchanged Continues to struggle with mood changes and depression with a diagnosis of IPF  She has a new diagnosis of CKD and is now followed by  [facility] with recent lumbar   Outpatient Encounter Medications as of 11/23/2021  Medication Sig   acetaminophen (TYLENOL) 650 MG CR tablet Take 650 mg by mouth every 8 (eight) hours as needed for pain.   albuterol (VENTOLIN HFA) 108 (90 Base) MCG/ACT inhaler INHALE 1 TO 2 PUFFS INTO THE LUNGS EVERY 6 HOURS AS NEEDED   cetirizine (ZYRTEC) 10 MG tablet TAKE 1 TABLET(10 MG) BY MOUTH AT BEDTIME   Cholecalciferol (VITAMIN D3) 25 MCG (1000 UT) CAPS Take by mouth.   famotidine (PEPCID) 40 MG tablet Take 40 mg by mouth daily.   fluticasone (FLONASE) 50 MCG/ACT nasal spray Place 2 sprays into both nostrils daily.   OFEV 100 MG CAPS TAKE 1 CAPSULE BY MOUTH TWICE A  DAY 12 HOURS APART WITH FOOD   omeprazole (PRILOSEC) 40 MG capsule TAKE 1 CAPSULE(40 MG) BY MOUTH DAILY   traZODone (DESYREL) 50 MG tablet Take 50 mg by mouth at bedtime.   traMADol (ULTRAM) 50 MG tablet Take 1 tablet (50 mg total) by mouth every 12 (twelve) hours as needed.   No facility-administered encounter medications on file as of 11/23/2021.   Physical Exam: Blood pressure 112/64, pulse 83, temperature 98.1 F (36.7 C), temperature source Oral, height 5' (1.524 m), weight 146 lb 6.4 oz (66.4 kg), SpO2 96 %. Gen:      No acute distress HEENT:  EOMI, sclera anicteric Neck:     No masses; no thyromegaly Lungs:    Bibasal crackles CV:         Regular rate  and rhythm; no murmurs Abd:      + bowel sounds; soft, non-tender; no palpable masses, no distension Ext:    No edema; adequate peripheral perfusion Skin:      Warm and dry; no rash Neuro: alert and oriented x 3 Psych: normal mood and affect   Data Reviewed: Imaging: Chest x-ray 05/15/2020-diffuse interstitial opacities CT chest 06/17/2020-reticulation with traction bronchiectasis, honeycombing most prominent in the right lung base.  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 High-resolution CT 11/02/2021-stable findings of UIP pattern pulmonary fibrosis I have reviewed the  images personally.  PFTs: 09/09/2020 FVC 1.80 [85%], FEV1 1.55 [94%], F/F 86, TLC 3.05 [60%], DLCO 10.61 [61%] Mild-moderate restriction and diffusion defect  Labs: ANA 06/27/2019 1:40, cytoplasmic, rheumatoid factor less than 14 ANA 07/16/2020-1:40, SSA greater than 8  Assessment:  IPF CT with UIP pattern fibrosis. She does have mild arthritis symptoms which may be secondary to osteoarthritis but does endorse some morning stiffness and small joint pain.  She had some occupational exposures to textile fiber and dust but no asbestos. Her exposures are not a cause of the pulmonary finrosis No evidence of autoimmune process, connective tissue disease per rheumatology evaluation.  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  Finished pulm rehab Monitor hepatic panel  We discussed referral to lung transplantation but she would like to hold off for now  CKD Now followed by nephrology We will obtain progress notes and recent labs for review  Depression She is still struggling with her new diagnosis with feelings of depression I have offered referral to psychiatry but she wants to hold off for now Have encouraged her to follow-up with primary care  GERD History notable for significant GERD symptoms, reflux and esophageal stricture.  Follows with Dr. Russella Dar.    Plan/Recommendations: Continue Durel Salts, monitor hepatic panel Medical records from nephrology  Chilton Greathouse MD [facility] 11/23/2021, 3:27 PM  CC: Nche, Bonna Gains, NP

## 2021-11-23 NOTE — Patient Instructions (Signed)
I am glad you are stable with regard to breathing The CT scan shows stable scarring of the lung Continue Ofev We will get blood test from your nephrologist Will order follow-up CMP in a month Return to clinic in 3 months

## 2021-11-24 ENCOUNTER — Telehealth: Payer: Self-pay | Admitting: Pulmonary Disease

## 2021-11-25 NOTE — Telephone Encounter (Signed)
Patient returned to call to let Dr. Isaiah Serge know nephrologist name is Dr. Anthony Sar and her last OV with labs was 11/08/21. Medical release was signed at [facility] and patient labs requested.

## 2021-11-26 ENCOUNTER — Other Ambulatory Visit: Payer: Self-pay | Admitting: Pharmacist

## 2021-11-26 DIAGNOSIS — J84112 Idiopathic pulmonary fibrosis: Secondary | ICD-10-CM

## 2021-11-26 MED ORDER — OFEV 100 MG PO CAPS: ORAL_CAPSULE | ORAL | 2 refills | Status: DC

## 2021-11-26 NOTE — Telephone Encounter (Signed)
Refill for Ofev 100mg  twice daily sent to [facility]. Patient had OV on 11/23/21 with plan to continue Ofev.  11/25/21, PharmD, MPH, BCPS Clinical Pharmacist (Rheumatology and Pulmonology)

## 2021-11-27 ENCOUNTER — Other Ambulatory Visit: Payer: Self-pay | Admitting: Nurse Practitioner

## 2021-11-29 ENCOUNTER — Encounter: Payer: Self-pay | Admitting: Pulmonary Disease

## 2021-12-13 ENCOUNTER — Telehealth: Payer: Self-pay | Admitting: Pulmonary Disease

## 2021-12-13 NOTE — Telephone Encounter (Signed)
Patient dropped off long term disability paperwork. Sent to [facility] for review. ?

## 2021-12-27 ENCOUNTER — Other Ambulatory Visit (INDEPENDENT_AMBULATORY_CARE_PROVIDER_SITE_OTHER): Payer: BC Managed Care – PPO

## 2021-12-27 DIAGNOSIS — Z5181 Encounter for therapeutic drug level monitoring: Secondary | ICD-10-CM

## 2021-12-27 NOTE — Telephone Encounter (Signed)
Forms sent back to Dr. Mannam for review and signature.  °

## 2021-12-28 ENCOUNTER — Telehealth: Payer: Self-pay | Admitting: Pulmonary Disease

## 2021-12-28 ENCOUNTER — Encounter: Payer: Self-pay | Admitting: Pulmonary Disease

## 2021-12-28 LAB — COMPREHENSIVE METABOLIC PANEL
ALT: 10 U/L (ref 0–35)
AST: 23 U/L (ref 0–37)
Albumin: 4.4 g/dL (ref 3.5–5.2)
Alkaline Phosphatase: 44 U/L (ref 39–117)
BUN: 17 mg/dL (ref 6–23)
CO2: 28 mEq/L (ref 19–32)
Calcium: 9.4 mg/dL (ref 8.4–10.5)
Chloride: 105 mEq/L (ref 96–112)
Creatinine, Ser: 1.13 mg/dL (ref 0.40–1.20)
GFR: 50.96 mL/min — ABNORMAL LOW (ref >60.00)
Glucose, Bld: 94 mg/dL (ref 70–99)
Potassium: 3.9 mEq/L (ref 3.5–5.1)
Sodium: 139 mEq/L (ref 135–145)
Total Bilirubin: 0.3 mg/dL (ref 0.2–1.2)
Total Protein: 7.2 g/dL (ref 6.0–8.3)

## 2021-12-28 NOTE — Telephone Encounter (Signed)
Signed disability form faxed to [employer] fax# [phone]. ?

## 2021-12-30 NOTE — Telephone Encounter (Signed)
Per Darilyn's message on 12/28/2021, "Signed disability form faxed to [employer] insurance fax# [phone]." ?

## 2022-01-04 ENCOUNTER — Telehealth: Payer: Self-pay | Admitting: Pulmonary Disease

## 2022-01-04 NOTE — Telephone Encounter (Signed)
Called and spoke with patient.  Patient stated she received a call from pharmacy team about Ofev shipment.  Patient stated she also received call from [employer] mail order and Ofev shipment was scheduled.  Nothing further at this time. ?

## 2022-01-16 ENCOUNTER — Other Ambulatory Visit: Payer: Self-pay | Admitting: Nurse Practitioner

## 2022-01-16 DIAGNOSIS — R053 Chronic cough: Secondary | ICD-10-CM

## 2022-01-16 DIAGNOSIS — K2100 Gastro-esophageal reflux disease with esophagitis, without bleeding: Secondary | ICD-10-CM

## 2022-01-17 ENCOUNTER — Telehealth: Payer: Self-pay | Admitting: Gastroenterology

## 2022-01-17 NOTE — Telephone Encounter (Signed)
Inbound call from patient requesting medication refill for famotidine and omeprazole sent to  [facility] on [address]. Have appt scheduled 5/17 ?

## 2022-01-18 MED ORDER — FAMOTIDINE 40 MG PO TABS: 40.0000 mg | ORAL_TABLET | Freq: Every day | ORAL | 1 refills | Status: DC

## 2022-01-18 MED ORDER — OMEPRAZOLE 40 MG PO CPDR: DELAYED_RELEASE_CAPSULE | ORAL | 1 refills | Status: DC

## 2022-01-18 NOTE — Telephone Encounter (Signed)
Prescription sent to patient's pharmacy until scheduled appt. 

## 2022-01-19 ENCOUNTER — Other Ambulatory Visit: Payer: Self-pay | Admitting: Nurse Practitioner

## 2022-01-19 DIAGNOSIS — R053 Chronic cough: Secondary | ICD-10-CM

## 2022-01-19 DIAGNOSIS — K2100 Gastro-esophageal reflux disease with esophagitis, without bleeding: Secondary | ICD-10-CM

## 2022-01-19 MED ORDER — ALBUTEROL SULFATE HFA 108 (90 BASE) MCG/ACT IN AERS: 1.0000 | INHALATION_SPRAY | Freq: Four times a day (QID) | RESPIRATORY_TRACT | 0 refills | Status: DC | PRN

## 2022-01-19 NOTE — Telephone Encounter (Signed)
Chart supports Rx 

## 2022-01-21 NOTE — Telephone Encounter (Signed)
Pt states she is having to use this more and would like a refill. Please advise ?

## 2022-01-26 NOTE — Telephone Encounter (Signed)
Rx filled on 01/19/22 ?

## 2022-01-28 ENCOUNTER — Telehealth: Payer: Self-pay | Admitting: Pulmonary Disease

## 2022-01-28 ENCOUNTER — Encounter: Payer: Self-pay | Admitting: Pulmonary Disease

## 2022-01-28 ENCOUNTER — Ambulatory Visit (INDEPENDENT_AMBULATORY_CARE_PROVIDER_SITE_OTHER): Payer: BC Managed Care – PPO | Admitting: Pulmonary Disease

## 2022-01-28 VITALS — BP 120/62 | HR 71 | Temp 98.1°F | Ht 60.0 in | Wt 140.4 lb

## 2022-01-28 DIAGNOSIS — J84112 Idiopathic pulmonary fibrosis: Secondary | ICD-10-CM | POA: Diagnosis not present

## 2022-01-28 DIAGNOSIS — Z5181 Encounter for therapeutic drug level monitoring: Secondary | ICD-10-CM

## 2022-01-28 NOTE — Progress Notes (Signed)
? ?      ?Sandra Curtis    [id number]    1956-07-31 ? ?Primary Care Physician:Nche, Bonna Gains, NP ? ?Referring Physician: [name], NP ?[phone] ?[place],  [place] [address] ? ?Chief complaint: Follow-up for IPF ?Did not tolerate Esbriet ?Started Ofev April 2022 ? ?HPI: ?66 year old with history of allergies, GERD, esophageal stricture ?Complains of chronic cough for several years, worsening dyspnea on exertion. ?She had a CT scan done by Dr. Elease Etienne, primary care showing pulmonary fibrosis and has been referred for further evaluation. ? ?Previously followed by Dr. Russella Dar, GI with EGD in 2022 showing esophageal stricture secondary to acid reflux.   ?Has also seen Dr. Dimple Casey, rheumatology for evaluation of elevated ANA and SSA.  He feels she does not have any signs of autoimmune disease. ? ?Started Esbriet in December 2021.  But did not tolerate even at a low dose due to dizziness and headache.l ?Antifibrotic switched to Ofev in April 2022 ?She is tolerating this better ? ?She has a new diagnosis of CKD and is now followed by Dr. Anthony Sar, [facility] ? 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  No mold, hot tub, Jacuzzi.  No feather pillows or comforters ?Smoking history: 18-pack-year smoker.  Quit 1 month ago ?Travel history: No significant travel history ?Relevant family history: No significant family issue of lung disease ? ?Interval history: ?Continues on Ofev 100 mg twice daily.  She is tolerating this well ?States her dyspnea on exertion is unchanged ? ?Outpatient Encounter Medications as of 01/28/2022  ?Medication Sig  ? acetaminophen (TYLENOL) 650 MG CR tablet Take 650 mg by mouth every 8 (eight) hours as needed for pain.  ? albuterol (VENTOLIN HFA) 108 (90 Base) MCG/ACT inhaler Inhale 1-2 puffs into the lungs every 6 (six) hours as  needed for wheezing or shortness of breath.  ? cetirizine (ZYRTEC) 10 MG tablet TAKE 1 TABLET(10 MG) BY MOUTH AT BEDTIME  ? Cholecalciferol (VITAMIN D3) 25 MCG (1000 UT) CAPS Take by mouth.  ? famotidine (PEPCID) 40 MG tablet Take 1 tablet (40 mg total) by mouth daily.  ? fluticasone (FLONASE) 50 MCG/ACT nasal spray Place 2 sprays into both nostrils daily.  ? Nintedanib (OFEV) 100 MG CAPS TAKE 1 CAPSULE BY MOUTH TWICE A  DAY 12 HOURS APART WITH FOOD  ? omeprazole (PRILOSEC) 40 MG capsule TAKE 1 CAPSULE(40 MG) BY MOUTH DAILY  ? traZODone (DESYREL) 50 MG tablet Take 50 mg by mouth at bedtime.  ? traMADol (ULTRAM) 50 MG tablet Take 1 tablet (50 mg total) by mouth every 12 (twelve) hours as needed.  ? ?No facility-administered encounter medications on file as of 01/28/2022.  ? ?Physical Exam: ?Blood pressure 120/62, pulse 71, temperature 98.1 ?F (36.7 ?C), temperature source Oral, height 5' (1.524 m), weight 140 lb 6.4 oz (63.7 kg), SpO2 98 %. ?Gen:      No acute distress ?HEENT:  EOMI, sclera anicteric ?Neck:     No masses; no thyromegaly ?Lungs:    Clear to auscultation bilaterally; normal respiratory effort ?CV:         Regular rate and rhythm; no murmurs ?Abd:      + bowel sounds; soft, non-tender; no palpable masses, no distension ?Ext:    No edema; adequate peripheral perfusion ?Skin:      Warm and dry; no rash ?Neuro: alert and oriented x  3 ?Psych: normal mood and affect  ? ?Data Reviewed: ?Imaging: ?Chest x-ray 05/15/2020-diffuse interstitial opacities ?CT chest 06/17/2020-reticulation with traction bronchiectasis, honeycombing most prominent in the right lung base.  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 ?High-resolution CT 11/02/2021-stable findings of UIP pattern pulmonary fibrosis ?I have reviewed the images personally. ? ?PFTs: ?09/09/2020 ?FVC 1.80 [85%], FEV1 1.55 [94%], F/F 86, TLC 3.05 [60%], DLCO 10.61 [61%] ?Mild-moderate restriction and diffusion defect ? ?Labs: ?ANA 06/27/2019 1:40, cytoplasmic, rheumatoid factor  less than 14 ?ANA 07/16/2020-1:40, SSA greater than 8 ? ?CMP 12/28/21- WNL ? ?Assessment:  ?IPF ?CT with UIP pattern fibrosis. ?She does have mild arthritis symptoms which may be secondary to osteoarthritis but does endorse some morning stiffness and small joint pain.  She had some occupational exposures to textile fiber and dust but no asbestos. Her exposures are not a cause of the pulmonary finrosis ?No evidence of autoimmune process, connective tissue disease per rheumatology evaluation. ? ?Case discussed at multidisciplinary conference and diagnosis felt to be consistent with IPF ?Unfortunately she has not tolerated Esbriet even at a low dose of 1 tablet 3 times a day and is now on Ofev at 100 mg twice daily. ? ?Finished pulm rehab ?Monitor hepatic panel ? ?We discussed referral to lung transplantation but she would like to hold off for now ? ?CKD ?Now followed by nephrology ? ?Depression ?She is still struggling with her new diagnosis with feelings of depression ?I have offered referral to psychiatry but she wants to hold off for now ?Have encouraged her to follow-up with primary care ? ?GERD ?History notable for significant GERD symptoms, reflux and esophageal stricture.  ?Follows with Dr. Russella Dar.   ? ?Plan/Recommendations: ?Continue Ofev, monitor hepatic panel ?PFTs and follow up in 6 months ? ?Chilton Greathouse MD ?[facility] ?01/28/2022, 10:09 AM ? ?CC: Nche, Bonna Gains, NP ? ? ?

## 2022-01-28 NOTE — Patient Instructions (Signed)
I am glad you are doing well with your breathing ?Continue the Esbriet ?We will order PFTs in 6 months and follow-up in clinic after. ?

## 2022-02-03 ENCOUNTER — Telehealth: Payer: Self-pay

## 2022-02-03 ENCOUNTER — Telehealth: Payer: Self-pay | Admitting: Pulmonary Disease

## 2022-02-03 DIAGNOSIS — J84112 Idiopathic pulmonary fibrosis: Secondary | ICD-10-CM

## 2022-02-03 NOTE — Telephone Encounter (Signed)
Previous notification that new PA was required was routed to Rx PA Team pool and was never forwarded to Rx Rheum/Pulm pool (specialty medication). Will begin PA renewal process now. ?

## 2022-02-03 NOTE — Telephone Encounter (Signed)
Submitted a Prior Authorization request to [employer] for OFEV via [email or web address] Will update once we receive a response. ? ? ?Prior Auth (EOC) ID: [id number] ?

## 2022-02-07 NOTE — Telephone Encounter (Signed)
Received a fax request for additional information. Chart notes that were submitted supported all questions except "showing positive response to therapy". Called plan and explained to rep that this could not really be provided due to how this type of drug works, and that the closest thing to "positive response to therapy" was that she was able to tolerate the medication (which she has been). Rep advised that I write that down and fax it in. ?

## 2022-02-07 NOTE — Telephone Encounter (Signed)
Refaxed chart notes along with HRCT from 11/02/21. Will await response. ?

## 2022-02-09 ENCOUNTER — Other Ambulatory Visit (HOSPITAL_COMMUNITY): Payer: Self-pay

## 2022-02-09 NOTE — Telephone Encounter (Signed)
Pt portion sent via DocuSign, preferred email address confirmed. Provider portion placed in Dr. Matilde Bash box for signature. ?

## 2022-02-09 NOTE — Telephone Encounter (Signed)
Received fax from Rx Benefits stating that PA request was closed due to new auth not being required for this medication. ? ?[employer] had previously called multiple times stating that a new PA was required. Reached out to [employer] and was informed that pt's coverage through [employer] was terminated as of 02/06/22. ? ?Benefits investigation revealed pt is now covered through [facility] part D, will initiate new BIV and ultimately discuss PAP with pt. ?

## 2022-02-10 ENCOUNTER — Other Ambulatory Visit (HOSPITAL_COMMUNITY): Payer: Self-pay

## 2022-02-10 NOTE — Telephone Encounter (Signed)
Both pt and provider portions have been returned to pharmacy team. Pt is aware that she will need to provide [place] with income documentation. Will await delivery prior to submission. ?

## 2022-02-17 DIAGNOSIS — R768 Other specified abnormal immunological findings in serum: Secondary | ICD-10-CM | POA: Diagnosis not present

## 2022-02-17 DIAGNOSIS — R7989 Other specified abnormal findings of blood chemistry: Secondary | ICD-10-CM | POA: Diagnosis not present

## 2022-02-17 DIAGNOSIS — J8410 Pulmonary fibrosis, unspecified: Secondary | ICD-10-CM | POA: Diagnosis not present

## 2022-02-17 DIAGNOSIS — N183 Chronic kidney disease, stage 3 unspecified: Secondary | ICD-10-CM | POA: Diagnosis not present

## 2022-02-17 DIAGNOSIS — D631 Anemia in chronic kidney disease: Secondary | ICD-10-CM | POA: Diagnosis not present

## 2022-02-17 DIAGNOSIS — N2581 Secondary hyperparathyroidism of renal origin: Secondary | ICD-10-CM | POA: Diagnosis not present

## 2022-02-17 DIAGNOSIS — E1122 Type 2 diabetes mellitus with diabetic chronic kidney disease: Secondary | ICD-10-CM | POA: Diagnosis not present

## 2022-02-18 ENCOUNTER — Other Ambulatory Visit: Payer: Self-pay | Admitting: Nurse Practitioner

## 2022-02-18 DIAGNOSIS — N183 Chronic kidney disease, stage 3 unspecified: Secondary | ICD-10-CM | POA: Diagnosis not present

## 2022-02-18 DIAGNOSIS — R053 Chronic cough: Secondary | ICD-10-CM

## 2022-02-18 DIAGNOSIS — K2100 Gastro-esophageal reflux disease with esophagitis, without bleeding: Secondary | ICD-10-CM

## 2022-02-18 DIAGNOSIS — R799 Abnormal finding of blood chemistry, unspecified: Secondary | ICD-10-CM | POA: Diagnosis not present

## 2022-02-23 ENCOUNTER — Ambulatory Visit: Payer: Self-pay | Admitting: Gastroenterology

## 2022-02-24 NOTE — Telephone Encounter (Signed)
Patient checking on RX for Ofev. Patient phone number is [phone].

## 2022-02-24 NOTE — Telephone Encounter (Signed)
Returned pt's call, reminded her that we are still waiting for income documents. Pt states she will either come drop them off at the clinic or mail them in. Will await their delivery.

## 2022-02-25 ENCOUNTER — Ambulatory Visit: Payer: BC Managed Care – PPO | Admitting: Pulmonary Disease

## 2022-02-25 NOTE — Telephone Encounter (Signed)
Patient dropped off income paperwork- placed in pharmacy box.

## 2022-02-28 NOTE — Telephone Encounter (Signed)
Submitted Patient Assistance Application to [employer] for OFEV along with provider portion, PA and income documents. Will update patient when we receive a response.  Fax# [phone] Phone# [phone] 

## 2022-02-28 NOTE — Telephone Encounter (Signed)
Received a fax from  [facility] regarding an approval for OFEV patient assistance from 02/28/2022 to 10/09/2022. Approval letter sent to scan center.  Reached out to pt and provided update as well as phone number for company. Pt verbalized understanding.  Phone number: [phone]

## 2022-03-09 MED ORDER — OFEV 100 MG PO CAPS: ORAL_CAPSULE | ORAL | 1 refills | Status: DC

## 2022-03-09 NOTE — Telephone Encounter (Signed)
Patient received Ofev shipment from [facility] Will close encounter.  Chesley Mires, PharmD, MPH, BCPS, CPP Clinical Pharmacist (Rheumatology and Pulmonology)

## 2022-03-09 NOTE — Addendum Note (Signed)
Addended by: Murrell Redden on: 03/09/2022 02:42 PM   Modules accepted: Orders

## 2022-04-20 ENCOUNTER — Other Ambulatory Visit: Payer: Self-pay | Admitting: Gastroenterology

## 2022-04-21 ENCOUNTER — Telehealth: Payer: Self-pay | Admitting: Gastroenterology

## 2022-04-21 MED ORDER — OMEPRAZOLE 40 MG PO CPDR: DELAYED_RELEASE_CAPSULE | ORAL | 0 refills | Status: DC

## 2022-04-21 NOTE — Telephone Encounter (Signed)
Only received automated refill request for famotidine yesterday and it was refilled. I refilled the prescription of omeprazole today per patient's request. If medications need prior authorizations then will send to PA team. Patient notified and patient verbalized understanding.

## 2022-04-21 NOTE — Telephone Encounter (Signed)
Patient called and stated the omeprazole was denied at her pharmacy.  She had called yesterday for refills of both famotidine (which she takes in the morning) and omeprazole (which she takes at night).  Her insurance  has changed to [facility] and it may require a prior [place].  Please call patient and advise.  Thank you.

## 2022-04-25 ENCOUNTER — Telehealth: Payer: Self-pay | Admitting: Pulmonary Disease

## 2022-04-25 NOTE — Telephone Encounter (Signed)
Dr. Isaiah Serge, please advise on this in regards to when pt needs to have bloodwork done due to being on OFEV.

## 2022-04-28 NOTE — Telephone Encounter (Signed)
Yes. She will need a hepatic panel them every 3-6 months. The last test was in Apri 2023 so we can get the next one at return clinic visit. Please make return visit in Sept.

## 2022-04-29 NOTE — Telephone Encounter (Signed)
Called patient and got him scheduled for a visit with Dr Isaiah Serge for September 8,023 at 3:30. Patient is aware of blood work that will be needed to continue in September. Nothing further needed

## 2022-05-17 ENCOUNTER — Other Ambulatory Visit: Payer: Self-pay | Admitting: Nurse Practitioner

## 2022-05-17 ENCOUNTER — Other Ambulatory Visit: Payer: Self-pay | Admitting: Gastroenterology

## 2022-05-17 DIAGNOSIS — R053 Chronic cough: Secondary | ICD-10-CM

## 2022-05-17 DIAGNOSIS — K2100 Gastro-esophageal reflux disease with esophagitis, without bleeding: Secondary | ICD-10-CM

## 2022-05-17 NOTE — Telephone Encounter (Signed)
Chart supports Rx Last OV: 04/2021 Next OV: not scheduled   Pt needs a f/u appt

## 2022-05-24 ENCOUNTER — Other Ambulatory Visit: Payer: Self-pay | Admitting: Gastroenterology

## 2022-06-08 ENCOUNTER — Encounter: Payer: Self-pay | Admitting: Gastroenterology

## 2022-06-08 ENCOUNTER — Ambulatory Visit: Payer: Medicare Other | Admitting: Gastroenterology

## 2022-06-08 VITALS — BP 110/68 | HR 88 | Ht 60.0 in | Wt 140.3 lb

## 2022-06-08 DIAGNOSIS — K2100 Gastro-esophageal reflux disease with esophagitis, without bleeding: Secondary | ICD-10-CM

## 2022-06-08 DIAGNOSIS — R1319 Other dysphagia: Secondary | ICD-10-CM

## 2022-06-08 MED ORDER — OMEPRAZOLE 40 MG PO CPDR: DELAYED_RELEASE_CAPSULE | ORAL | 3 refills | Status: DC

## 2022-06-08 NOTE — Patient Instructions (Addendum)
We have sent the following medications to your pharmacy for you to pick up at your convenience: omeprazole 40 mg twice daily.  Continue famotidine daily at bedtime.   Patient advised to avoid spicy, acidic, citrus, chocolate, mints, fruit and fruit juices.  Limit the intake of caffeine, alcohol and Soda.  Don't exercise too soon after eating.  Don't lie down within 3-4 hours of eating.  Elevate the head of your bed.  You have been scheduled for an endoscopy. Please follow written instructions given to you at your visit today. If you use inhalers (even only as needed), please bring them with you on the day of your procedure.  The [facility] providers would like to encourage you to use [facility] to communicate with providers for non-urgent requests or questions.  Due to long hold times on the telephone, sending your provider a message by [facility] may be a faster and more efficient way to get a response.  Please allow 48 business hours for a response.  Please remember that this is for non-urgent requests.   Due to recent changes in healthcare laws, you may see the results of your imaging and laboratory studies on MyChart before your provider has had a chance to review them.  We understand that in some cases there may be results that are confusing or concerning to you. Not all laboratory results come back in the same time frame and the provider may be waiting for multiple results in order to interpret others.  Please give [place] 48 hours in order for your provider to thoroughly review all the results before contacting the office for clarification of your results.   Thank you for choosing me and [facility].  Venita Lick. Pleas Koch., MD., Clementeen Graham

## 2022-06-08 NOTE — Progress Notes (Signed)
    Assessment     GERD with LA Class C esophagitis, medium-sized hiatal hernia, worsening reflux symptoms, suspected recurrent esophageal stricture IPF   Recommendations    Increase omeprazole to 40 mg po bid, continue famotidine 40 mg at bedtime elevate HOB 4" long term, follow antireflux measures Schedule EGD possible dilation. The risks (including bleeding, perforation, infection, missed lesions, medication reactions and possible hospitalization or surgery if complications occur), benefits, and alternatives to endoscopy with possible biopsy and possible dilation were discussed with the patient and they consent to proceed.     HPI    This is a 66 year old female with a history of GERD and LA class C esophagitis, esophageal stricture who relates worsening reflux symptoms and worsening solid food dysphagia.  She was prescribed pantoprazole 40 mg twice daily and famotidine 40 mg at bedtime at her last visit.  She is currently taking omeprazole 40 mg daily and famotidine 40 mg at bedtime.  She relates frequent reflux, regurgitation, chest burning, difficulty swallowing solid foods, nighttime reflux.   Labs / Imaging       Latest Ref Rng & Units 12/27/2021    3:27 PM 08/27/2021    2:35 PM 07/13/2021    4:20 PM  Hepatic Function  Total Protein 6.0 - 8.3 g/dL 7.2  8.3  8.0   Albumin 3.5 - 5.2 g/dL 4.4  4.7  4.4   AST 0 - 37 U/L 23  32  29   ALT 0 - 35 U/L $Remo'10  19  17   'BWhyS$ Alk Phosphatase 39 - 117 U/L 44  50  47   Total Bilirubin 0.2 - 1.2 mg/dL 0.3  0.4  0.3        Latest Ref Rng & Units 08/27/2021    2:35 PM 11/17/2020   10:25 AM 06/26/2020   10:54 AM  CBC  WBC 4.0 - 10.5 K/uL 6.8  5.9  5.9   Hemoglobin 12.0 - 15.0 g/dL 10.5  10.5  11.2   Hematocrit 36.0 - 46.0 % 32.3  32.1  34.6   Platelets 150.0 - 400.0 K/uL 251.0  240.0  221.0     Current Medications, Allergies, Past Medical History, Past Surgical History, Family History and Social History were reviewed in [employer] record.   Physical Exam: General: Well developed, well nourished, no acute distress Head: Normocephalic and atraumatic Eyes: Sclerae anicteric, EOMI Ears: Normal auditory acuity Mouth: Not examined Lungs: Clear throughout to auscultation Heart: Regular rate and rhythm; no murmurs, rubs or bruits Abdomen: Soft, non tender and non distended. No masses, hepatosplenomegaly or hernias noted. Normal Bowel sounds Rectal: Not done Musculoskeletal: Symmetrical with no gross deformities  Pulses:  Normal pulses noted Extremities: No clubbing, cyanosis, edema or deformities noted Neurological: Alert oriented x 4, grossly nonfocal Psychological:  Alert and cooperative. Normal mood and affect   Sandra Curtis T. Fuller Plan, MD 06/08/2022, 10:34 AM

## 2022-06-13 ENCOUNTER — Other Ambulatory Visit: Payer: Self-pay | Admitting: Nurse Practitioner

## 2022-06-13 DIAGNOSIS — K2100 Gastro-esophageal reflux disease with esophagitis, without bleeding: Secondary | ICD-10-CM

## 2022-06-13 DIAGNOSIS — R053 Chronic cough: Secondary | ICD-10-CM

## 2022-06-17 ENCOUNTER — Ambulatory Visit: Payer: Medicare Other | Admitting: Pulmonary Disease

## 2022-06-17 ENCOUNTER — Encounter: Payer: Self-pay | Admitting: Pulmonary Disease

## 2022-06-17 VITALS — BP 116/62 | HR 84 | Temp 98.6°F | Ht 60.0 in | Wt 140.0 lb

## 2022-06-17 DIAGNOSIS — R0602 Shortness of breath: Secondary | ICD-10-CM | POA: Diagnosis not present

## 2022-06-17 DIAGNOSIS — Z5181 Encounter for therapeutic drug level monitoring: Secondary | ICD-10-CM

## 2022-06-17 DIAGNOSIS — J84112 Idiopathic pulmonary fibrosis: Secondary | ICD-10-CM

## 2022-06-17 MED ORDER — BENZONATATE 200 MG PO CAPS: 200.0000 mg | ORAL_CAPSULE | Freq: Two times a day (BID) | ORAL | 1 refills | Status: DC | PRN

## 2022-06-17 NOTE — Patient Instructions (Signed)
We will get a high-resolution CT and repeat pulmonary function test for evaluation of the lung Check comprehensive metabolic panel and proBNP for dyspnea Return to clinic in 2 months for review of tests and plan for next steps

## 2022-06-17 NOTE — Progress Notes (Signed)
Sandra Curtis    [id number]    06-26-1956  Primary Care Physician:Nche, Bonna Gains, NP  Referring Physician: [name], NP [address] [place],  [place] [address]  Chief complaint: Follow-up for IPF Did not tolerate [name] April 2022  HPI: 66 year old with history of allergies, GERD, esophageal stricture Complains of chronic cough for several years, worsening dyspnea on exertion. She had a CT scan done by Dr. Elease Etienne, primary care showing pulmonary fibrosis and has been referred for further evaluation.  Previously followed by Dr. Russella Dar, GI with EGD in 2022 showing esophageal stricture secondary to acid reflux.   Has also seen Dr. Dimple Casey, rheumatology for evaluation of elevated ANA and SSA.  He feels she does not have any signs of autoimmune disease.  Started Esbriet in December 2021.  But did not tolerate even at a low dose due to dizziness and headache.l Antifibrotic switched to Ofev in April 2022 She is tolerating this better  She has a new diagnosis of CKD and is now followed by Dr. 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  No mold, hot tub, Jacuzzi.  No feather pillows or comforters Smoking history: 18-pack-year smoker.  Quit 1 month ago Travel history: No significant travel history Relevant family history: No significant family issue of lung disease  Interval history: Continues on Ofev 100 mg twice daily.  She has symptoms of fatigue with this but is continuing to take it Breathing is worse with increased dyspnea on exertion.  Continues to have cough.  Outpatient Encounter Medications as of 06/17/2022  Medication Sig   acetaminophen (TYLENOL) 650 MG CR tablet Take 650 mg by mouth every 8 (eight) hours as needed for pain.   albuterol (VENTOLIN HFA) 108 (90 Base)  MCG/ACT inhaler INHALE 1 TO 2 PUFFS INTO THE LUNGS EVERY 6 HOURS AS NEEDED   Cholecalciferol (VITAMIN D3) 25 MCG (1000 UT) CAPS Take by mouth.   famotidine (PEPCID) 40 MG tablet TAKE 1 TABLET(40 MG) BY MOUTH DAILY   Nintedanib (OFEV) 100 MG CAPS TAKE 1 CAPSULE BY MOUTH TWICE A  DAY 12 HOURS APART WITH FOOD   omeprazole (PRILOSEC) 40 MG capsule TAKE 1 CAPSULE(40 MG) BY MOUTH TWICE DAILY   fluticasone (FLONASE) 50 MCG/ACT nasal spray Place 2 sprays into both nostrils daily. (Patient not taking: Reported on 06/08/2022)   traZODone (DESYREL) 50 MG tablet Take 50 mg by mouth at bedtime. (Patient not taking: Reported on 06/17/2022)   [DISCONTINUED] cetirizine (ZYRTEC) 10 MG tablet TAKE 1 TABLET(10 MG) BY MOUTH AT BEDTIME   [DISCONTINUED] traMADol (ULTRAM) 50 MG tablet Take 1 tablet (50 mg total) by mouth every 12 (twelve) hours as needed.   No facility-administered encounter medications on file as of 06/17/2022.   Physical Exam: Blood pressure 116/62, pulse 84, temperature 98.6 F (37 C), temperature source Oral, height 5' (1.524 m), weight 140 lb (63.5 kg), SpO2 97 %. Gen:      No acute distress HEENT:  EOMI, sclera anicteric Neck:     No masses; no thyromegaly Lungs:    Bibasal crackles CV:         Regular rate and rhythm; no murmurs Abd:      + bowel sounds; soft, non-tender; no palpable masses, no distension Ext:    No edema; adequate peripheral perfusion Skin:  Warm and dry; no rash Neuro: alert and oriented x 3 Psych: normal mood and affect  Data Reviewed: Imaging: Chest x-ray 05/15/2020-diffuse interstitial opacities CT chest 06/17/2020-reticulation with traction bronchiectasis, honeycombing most prominent in the right lung base.  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 High-resolution CT 11/02/2021-stable findings of UIP pattern pulmonary fibrosis I have reviewed the images personally.  PFTs: 09/09/2020 FVC 1.80 [85%], FEV1 1.55 [94%], F/F 86, TLC 3.05 [60%], DLCO 10.61 [61%] Mild-moderate  restriction and diffusion defect  Labs: ANA 06/27/2019 1:40, cytoplasmic, rheumatoid factor less than 14 ANA 07/16/2020-1:40, SSA greater than 8  CMP 12/28/21- WNL  Assessment:  IPF CT with UIP pattern fibrosis. She does have mild arthritis symptoms which may be secondary to osteoarthritis but does endorse some morning stiffness and small joint pain.  She had some occupational exposures to textile fiber and dust but no asbestos. Her exposures are not a cause of the pulmonary finrosis No evidence of autoimmune process, connective tissue disease per rheumatology evaluation.  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 Finished pulm rehab We discussed referral to lung transplantation but she would like to hold off for now  With worsening dyspnea symptoms we will need to reevaluate with high-res CT and PFTs Check comprehensive metabolic panel and proBNP.  If proBNP is elevated then order echocardiogram  CKD Now followed by nephrology  Depression She is still struggling with her new diagnosis with feelings of depression I have offered referral to psychiatry but she wants to hold off for now Have encouraged her to follow-up with primary care  GERD History notable for significant GERD symptoms, reflux and esophageal stricture.  Follows with Dr. Russella Dar who is planning dilatation in the near future  Plan/Recommendations: Continue Ofev, monitor hepatic panel, proBNP Tessalon for cough High-res CT, PFTs Check CMP and proBNP  Chilton Greathouse MD [facility] 06/17/2022, 3:30 PM  CC: Nche, Bonna Gains, NP

## 2022-07-05 ENCOUNTER — Ambulatory Visit (AMBULATORY_SURGERY_CENTER): Payer: Medicare Other | Admitting: Gastroenterology

## 2022-07-05 ENCOUNTER — Encounter: Payer: Self-pay | Admitting: Gastroenterology

## 2022-07-05 VITALS — BP 108/63 | HR 77 | Temp 97.5°F | Resp 14 | Ht 60.0 in | Wt 140.0 lb

## 2022-07-05 DIAGNOSIS — K449 Diaphragmatic hernia without obstruction or gangrene: Secondary | ICD-10-CM | POA: Diagnosis not present

## 2022-07-05 DIAGNOSIS — R1310 Dysphagia, unspecified: Secondary | ICD-10-CM | POA: Diagnosis not present

## 2022-07-05 DIAGNOSIS — K2100 Gastro-esophageal reflux disease with esophagitis, without bleeding: Secondary | ICD-10-CM | POA: Diagnosis not present

## 2022-07-05 DIAGNOSIS — K219 Gastro-esophageal reflux disease without esophagitis: Secondary | ICD-10-CM | POA: Diagnosis not present

## 2022-07-05 DIAGNOSIS — K222 Esophageal obstruction: Secondary | ICD-10-CM | POA: Diagnosis not present

## 2022-07-05 MED ORDER — SODIUM CHLORIDE 0.9 % IV SOLN: 500.0000 mL | Freq: Once | INTRAVENOUS | Status: DC

## 2022-07-05 NOTE — Op Note (Signed)
[facility] Patient Name: Sandra Curtis Procedure Date: 07/05/2022 3:33 PM MRN: [id number] Endoscopist: Ladene Artist , MD Age: 66 Referring MD:  Date of Birth: 1956/01/26 Gender: Female Account #: [id number] Procedure:                Upper GI endoscopy Indications:              Dysphagia, Gastroesophageal reflux disease Medicines:                Monitored Anesthesia Care Procedure:                Pre-Anesthesia Assessment:                           - Prior to the procedure, a History and Physical                            was performed, and patient medications and                            allergies were reviewed. The patient's tolerance of                            previous anesthesia was also reviewed. The risks                            and benefits of the procedure and the sedation                            options and risks were discussed with the patient.                            All questions were answered, and informed consent                            was obtained. Prior Anticoagulants: The patient has                            taken no previous anticoagulant or antiplatelet                            agents. ASA Grade Assessment: II - A patient with                            mild systemic disease. After reviewing the risks                            and benefits, the patient was deemed in                            satisfactory condition to undergo the procedure.                           After obtaining informed consent, the endoscope was  passed under direct vision. Throughout the                            procedure, the patient's blood pressure, pulse, and                            oxygen saturations were monitored continuously. The                            Endoscope was introduced through the mouth, and                            advanced to the second part of duodenum. The upper                            GI  endoscopy was accomplished without difficulty.                            The patient tolerated the procedure well. Scope In: Scope Out: Findings:                 One benign-appearing, intrinsic mild stenosis was                            found at the gastroesophageal junction. This                            stenosis measured 1.5 cm (inner diameter) x less                            than one cm (in length). The stenosis was                            traversed. A guidewire was placed and the scope was                            withdrawn. Dilation was performed with a Savary                            dilator with no resistance at 16 mm.                           The exam of the esophagus was otherwise normal.                           A medium-sized hiatal hernia was present.                           The exam of the stomach was otherwise normal.                           The duodenal bulb and second portion of the  duodenum were normal. Complications:            No immediate complications. Estimated Blood Loss:     Estimated blood loss: none. Impression:               - Benign-appearing esophageal stenosis. Dilated.                           - Medium-sized hiatal hernia.                           - Normal duodenal bulb and second portion of the                            duodenum.                           - No specimens collected. Recommendation:           - Patient has a contact number available for                            emergencies. The signs and symptoms of potential                            delayed complications were discussed with the                            patient. Return to normal activities tomorrow.                            Written discharge instructions were provided to the                            patient.                           - Clear liquid diet for 2 hours, then advance as                            tolerated to soft diet  later today.                           - Resume prior diet tomorrow.                           - Follow antireflux measures long term.                           - Continue present medications.                           - Return to GI office in 1 year. Ladene Artist, MD 07/05/2022 3:50:39 PM This report has been signed electronically.

## 2022-07-05 NOTE — Progress Notes (Signed)
See 06/08/2022 H&P, no changes 

## 2022-07-05 NOTE — Progress Notes (Signed)
Report to pacu rn. Vss. Care resumed by rn. 

## 2022-07-05 NOTE — Patient Instructions (Addendum)
Handouts Provided:  Post Dilation Diet and Hiatal Hernia  Recommendations: - Patient has a contact number available for emergencies. The signs and symptoms of potential delayed complications were discussed with the patient. Return to normal activities tomorrow. Written discharge instructions were provided to the patient. - Clear liquid diet for 2 hours, then advance as tolerated to soft diet later today. - Resume prior diet tomorrow. - Follow antireflux measures long term. - Continue present medications. - Return to GI office in 1 year.  YOU HAD AN ENDOSCOPIC PROCEDURE TODAY AT [facility]:   Refer to the procedure report that was given to you for any specific questions about what was found during the examination.  If the procedure report does not answer your questions, please call your gastroenterologist to clarify.  If you requested that your care partner not be given the details of your procedure findings, then the procedure report has been included in a sealed envelope for you to review at your convenience later.  YOU SHOULD EXPECT: Some feelings of bloating in the abdomen. Passage of more gas than usual.  Walking can help get rid of the air that was put into your GI tract during the procedure and reduce the bloating. If you had a lower endoscopy (such as a colonoscopy or flexible sigmoidoscopy) you may notice spotting of blood in your stool or on the toilet paper. If you underwent a bowel prep for your procedure, you may not have a normal bowel movement for a few days.  Please Note:  You might notice some irritation and congestion in your nose or some drainage.  This is from the oxygen used during your procedure.  There is no need for concern and it should clear up in a day or so.  SYMPTOMS TO REPORT IMMEDIATELY:  Following upper endoscopy (EGD)  Vomiting of blood or coffee ground material  New chest pain or pain under the shoulder blades  Painful or persistently  difficult swallowing  New shortness of breath  Fever of 100F or higher  Black, tarry-looking stools  For urgent or emergent issues, a gastroenterologist can be reached at any hour by calling [phone]. Do not use MyChart messaging for urgent concerns.    DIET:  We do recommend a small meal at first, but then you may proceed to your regular diet.  Drink plenty of fluids but you should avoid alcoholic beverages for 24 hours.  ACTIVITY:  You should plan to take it easy for the rest of today and you should NOT DRIVE or use heavy machinery until tomorrow (because of the sedation medicines used during the test).    FOLLOW UP: Our staff will call the number listed on your records the next business day following your procedure.  We will call around 7:15- 8:00 am to check on you and address any questions or concerns that you may have regarding the information given to you following your procedure. If we do not reach you, we will leave a message.     If any biopsies were taken you will be contacted by phone or by letter within the next 1-3 weeks.  Please call us at [phone] if you have not heard about the biopsies in 3 weeks.    SIGNATURES/CONFIDENTIALITY: You and/or your care partner have signed paperwork which will be entered into your electronic medical record.  These signatures attest to the fact that that the information above on your After Visit Summary has been reviewed and is understood.  Full responsibility of the confidentiality of this discharge information lies with you and/or your care-partner.

## 2022-07-06 ENCOUNTER — Telehealth: Payer: Self-pay | Admitting: *Deleted

## 2022-07-06 NOTE — Telephone Encounter (Signed)
  Follow up Call-     07/05/2022    3:24 PM 10/27/2020   10:56 AM  Call back number  Post procedure Call Back phone  # [phone]  Permission to leave phone message Yes Yes     Patient questions:  Do you have a fever, pain , or abdominal swelling? No. Pain Score  0 *  Have you tolerated food without any problems? Yes.    Have you been able to return to your normal activities? Yes.    Do you have any questions about your discharge instructions: Diet   No. Medications  No. Follow up visit  No.  Do you have questions or concerns about your Care? No.  Actions: * If pain score is 4 or above: No action needed, pain <4.

## 2022-07-14 DIAGNOSIS — E119 Type 2 diabetes mellitus without complications: Secondary | ICD-10-CM | POA: Diagnosis not present

## 2022-07-15 ENCOUNTER — Ambulatory Visit
Admission: RE | Admit: 2022-07-15 | Discharge: 2022-07-15 | Disposition: A | Discharge status: Home / Self Care | Payer: Medicare Other | Source: Ambulatory Visit | Attending: Pulmonary Disease | Admitting: Pulmonary Disease

## 2022-07-15 DIAGNOSIS — I700 Atherosclerosis of aorta: Secondary | ICD-10-CM | POA: Diagnosis not present

## 2022-07-15 DIAGNOSIS — K449 Diaphragmatic hernia without obstruction or gangrene: Secondary | ICD-10-CM | POA: Diagnosis not present

## 2022-07-15 DIAGNOSIS — J849 Interstitial pulmonary disease, unspecified: Secondary | ICD-10-CM | POA: Diagnosis not present

## 2022-07-15 DIAGNOSIS — J479 Bronchiectasis, uncomplicated: Secondary | ICD-10-CM | POA: Diagnosis not present

## 2022-07-15 DIAGNOSIS — J84112 Idiopathic pulmonary fibrosis: Secondary | ICD-10-CM

## 2022-08-15 ENCOUNTER — Other Ambulatory Visit: Payer: Self-pay | Admitting: Gastroenterology

## 2022-08-17 ENCOUNTER — Ambulatory Visit (INDEPENDENT_AMBULATORY_CARE_PROVIDER_SITE_OTHER): Payer: Medicare Other | Admitting: Pulmonary Disease

## 2022-08-17 ENCOUNTER — Ambulatory Visit: Payer: Medicare Other | Admitting: Pulmonary Disease

## 2022-08-17 ENCOUNTER — Encounter: Payer: Self-pay | Admitting: Pulmonary Disease

## 2022-08-17 VITALS — BP 120/62 | HR 85 | Temp 98.0°F | Ht 60.0 in | Wt 142.6 lb

## 2022-08-17 DIAGNOSIS — J84112 Idiopathic pulmonary fibrosis: Secondary | ICD-10-CM | POA: Diagnosis not present

## 2022-08-17 DIAGNOSIS — Z23 Encounter for immunization: Secondary | ICD-10-CM

## 2022-08-17 DIAGNOSIS — Z5181 Encounter for therapeutic drug level monitoring: Secondary | ICD-10-CM | POA: Diagnosis not present

## 2022-08-17 DIAGNOSIS — R0602 Shortness of breath: Secondary | ICD-10-CM | POA: Diagnosis not present

## 2022-08-17 LAB — PULMONARY FUNCTION TEST
DL/VA % pred: 76 %
DL/VA: 3.29 ml/min/mmHg/L
DLCO cor % pred: 57 %
DLCO cor: 9.83 ml/min/mmHg
DLCO unc % pred: 57 %
DLCO unc: 9.83 ml/min/mmHg
FEF 25-75 Post: 1.72 L/sec
FEF 25-75 Pre: 1.79 L/sec
FEF2575-%Change-Post: -3 %
FEF2575-%Pred-Post: 92 %
FEF2575-%Pred-Pre: 96 %
FEV1-%Change-Post: 3 %
FEV1-%Pred-Post: 74 %
FEV1-%Pred-Pre: 71 %
FEV1-Post: 1.5 L
FEV1-Pre: 1.45 L
FEV1FVC-%Change-Post: 2 %
FEV1FVC-%Pred-Pre: 109 %
FEV6-%Change-Post: 0 %
FEV6-%Pred-Post: 68 %
FEV6-%Pred-Pre: 67 %
FEV6-Post: 1.73 L
FEV6-Pre: 1.73 L
FEV6FVC-%Change-Post: 0 %
FEV6FVC-%Pred-Post: 104 %
FEV6FVC-%Pred-Pre: 104 %
FVC-%Change-Post: 0 %
FVC-%Pred-Post: 65 %
FVC-%Pred-Pre: 65 %
FVC-Post: 1.74 L
Post FEV1/FVC ratio: 86 %
Post FEV6/FVC ratio: 100 %
Pre FEV1/FVC ratio: 84 %
Pre FEV6/FVC Ratio: 100 %
RV % pred: 65 %
RV: 1.25 L
TLC % pred: 83 %
TLC: 3.7 L

## 2022-08-17 MED ORDER — OMEPRAZOLE 40 MG PO CPDR: DELAYED_RELEASE_CAPSULE | ORAL | 3 refills | Status: DC

## 2022-08-17 MED ORDER — AREXVY 120 MCG/0.5ML IM SUSR: 0.5000 mL | Freq: Once | INTRAMUSCULAR | 0 refills | Status: AC

## 2022-08-17 NOTE — Progress Notes (Signed)
Full PFT Performed Today  

## 2022-08-17 NOTE — Progress Notes (Signed)
Sandra Curtis    [id number]    1956/08/10  Primary Care Physician:Nche, Bonna Gains, NP  Referring Physician: [name], NP [address] [place],  [place] [address]  Chief complaint: Follow-up for IPF Did not tolerate [name] April 2022  HPI: 66 year old with history of allergies, GERD, esophageal stricture Complains of chronic cough for several years, worsening dyspnea on exertion. She had a CT scan done by Dr. Elease Etienne, primary care showing pulmonary fibrosis and has been referred for further evaluation.  Followed by Dr. Russella Dar, GI with EGD in 2022 showing esophageal stricture secondary to acid reflux.   Has also seen Dr. Dimple Casey, rheumatology for evaluation of elevated ANA and SSA.  He feels she does not have any signs of autoimmune disease.   Discussed at multidisciplinary conference in December 2021 with diagnosis of IPF Started Esbriet in December 2021.  But did not tolerate even at a low dose due to dizziness and headache. Antifibrotic switched to Ofev in April 2022 She is tolerating this better  She has a new diagnosis of CKD and is now followed by Dr. 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  No mold, hot tub, Jacuzzi.  No feather pillows or comforters Smoking history: 18-pack-year smoker.  Quit 1 month ago Travel history: No significant travel history Relevant family history: No significant family issue of lung disease  Interval history: Continues on Ofev 100 mg twice daily.  She has symptoms of fatigue with this but is continuing to take it She is here for review of CT and PFTs.  Outpatient Encounter Medications as of 08/17/2022  Medication Sig   acetaminophen (TYLENOL) 650 MG CR tablet Take 650 mg by mouth every 8 (eight) hours as needed for pain.   albuterol  (VENTOLIN HFA) 108 (90 Base) MCG/ACT inhaler INHALE 1 TO 2 PUFFS INTO THE LUNGS EVERY 6 HOURS AS NEEDED   benzonatate (TESSALON) 200 MG capsule Take 1 capsule (200 mg total) by mouth 2 (two) times daily as needed for cough.   Cholecalciferol (VITAMIN D3) 25 MCG (1000 UT) CAPS Take by mouth.   famotidine (PEPCID) 40 MG tablet TAKE 1 TABLET(40 MG) BY MOUTH DAILY   fluticasone (FLONASE) 50 MCG/ACT nasal spray Place 2 sprays into both nostrils daily.   Nintedanib (OFEV) 100 MG CAPS TAKE 1 CAPSULE BY MOUTH TWICE A  DAY 12 HOURS APART WITH FOOD   omeprazole (PRILOSEC) 40 MG capsule TAKE 1 CAPSULE(40 MG) BY MOUTH TWICE DAILY   traZODone (DESYREL) 50 MG tablet Take 50 mg by mouth at bedtime.   No facility-administered encounter medications on file as of 08/17/2022.   Physical Exam: Blood pressure 120/62, pulse 85, temperature 98 F (36.7 C), temperature source Oral, height 5' (1.524 m), weight 142 lb 9.6 oz (64.7 kg), SpO2 96 %. Gen:      No acute distress HEENT:  EOMI, sclera anicteric Neck:     No masses; no thyromegaly Lungs:    Bibasal crackles CV:         Regular rate and rhythm; no murmurs Abd:      + bowel sounds; soft, non-tender; no palpable masses, no distension Ext:    No edema; adequate peripheral perfusion Skin:      Warm and dry; no rash Neuro: alert and oriented x 3 Psych: normal  mood and affect   Data Reviewed: Imaging: Chest x-ray 05/15/2020-diffuse interstitial opacities CT chest 06/17/2020-reticulation with traction bronchiectasis, honeycombing most prominent in the right lung base.  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 High-resolution CT 11/02/2021-stable findings of UIP pattern pulmonary fibrosis High resolution CT 07/16/2022-stable findings of UIP pattern pulmonary fibrosis. I have reviewed the images personally.  PFTs: 09/09/2020 FVC 1.80 [85%], FEV1 1.55 [94%], F/F 86, TLC 3.05 [60%], DLCO 10.61 [61%] Mild-moderate restriction and diffusion defect  08/17/2022 FVC 1.74 [65%],  FEV1 1.50 [74%], F/F 86, TLC 3.70 [83%], DLCO 9.83 [57%]  Labs: ANA 06/27/2019 1:40, cytoplasmic, rheumatoid factor less than 14 ANA 07/16/2020-1:40, SSA greater than 8  CMP 12/28/21- WNL  Assessment:  IPF CT with UIP pattern fibrosis. She does have mild arthritis symptoms which may be secondary to osteoarthritis but does endorse some morning stiffness and small joint pain.  She had some occupational exposures to textile fiber and dust but no asbestos. Her exposures are not a cause of the pulmonary finrosis No evidence of autoimmune process, connective tissue disease per rheumatology evaluation.  Case discussed at multidisciplinary conference in dec 2021 and diagnosis felt to be consistent with IPF Unfortunately she has not tolerated Esbriet even at a low dose of 1 tablet 3 times a day and is now on Ofev at 100 mg twice daily. Finished pulm rehab We discussed referral to lung transplantation but she would like to hold off for now  CT and PFTs reviewed which shows stability Check comprehensive metabolic panel and proBNP.  If proBNP is elevated then order echocardiogram  CKD Now followed by nephrology  Depression She is still struggling with her diagnosis with feelings of depression I have offered referral to psychiatry but she wants to hold off for now Have encouraged her to follow-up with primary care  GERD History notable for significant GERD symptoms, reflux and esophageal stricture.  Underwent stricture dilatation in September 2023  Plan/Recommendations: Matthias Hughs, monitor hepatic panel, proBNP Tessalon for cough Check CMP and proBNP  Chilton Greathouse MD [facility] 08/17/2022, 3:17 PM  CC: Nche, Bonna Gains, NP

## 2022-08-17 NOTE — Patient Instructions (Signed)
We will get labs today for monitoring His CT and lung function test shows stability which is good news Continue current therapy Follow-up in 6 months

## 2022-08-17 NOTE — Addendum Note (Signed)
Addended by: Jacquiline Doe on: 08/17/2022 04:03 PM   Modules accepted: Orders

## 2022-08-17 NOTE — Patient Instructions (Signed)
Full PFT Performed Today  

## 2022-08-18 LAB — COMPREHENSIVE METABOLIC PANEL
ALT: 11 U/L (ref 0–35)
AST: 24 U/L (ref 0–37)
Albumin: 4.6 g/dL (ref 3.5–5.2)
Alkaline Phosphatase: 60 U/L (ref 39–117)
BUN: 16 mg/dL (ref 6–23)
CO2: 28 mEq/L (ref 19–32)
Calcium: 9.8 mg/dL (ref 8.4–10.5)
Chloride: 103 mEq/L (ref 96–112)
Creatinine, Ser: 1.13 mg/dL (ref 0.40–1.20)
GFR: 50.74 mL/min — ABNORMAL LOW (ref >60.00)
Glucose, Bld: 93 mg/dL (ref 70–99)
Potassium: 4 mEq/L (ref 3.5–5.1)
Sodium: 139 mEq/L (ref 135–145)
Total Bilirubin: 0.3 mg/dL (ref 0.2–1.2)
Total Protein: 7.9 g/dL (ref 6.0–8.3)

## 2022-08-18 LAB — PRO B NATRIURETIC PEPTIDE: NT-Pro BNP: <36 pg/mL (ref 0–301)

## 2022-08-24 ENCOUNTER — Ambulatory Visit (INDEPENDENT_AMBULATORY_CARE_PROVIDER_SITE_OTHER): Payer: Medicare Other | Admitting: Nurse Practitioner

## 2022-08-24 ENCOUNTER — Encounter: Payer: Self-pay | Admitting: Nurse Practitioner

## 2022-08-24 VITALS — BP 122/75 | HR 77 | Temp 97.4°F | Wt 141.6 lb

## 2022-08-24 DIAGNOSIS — E119 Type 2 diabetes mellitus without complications: Secondary | ICD-10-CM

## 2022-08-24 DIAGNOSIS — R768 Other specified abnormal immunological findings in serum: Secondary | ICD-10-CM | POA: Diagnosis not present

## 2022-08-24 DIAGNOSIS — M5137 Other intervertebral disc degeneration, lumbosacral region: Secondary | ICD-10-CM | POA: Diagnosis not present

## 2022-08-24 DIAGNOSIS — I700 Atherosclerosis of aorta: Secondary | ICD-10-CM | POA: Diagnosis not present

## 2022-08-24 LAB — CBC WITH DIFFERENTIAL/PLATELET
Basophils Absolute: 0 10*3/uL (ref 0.0–0.1)
Basophils Relative: 0.7 % (ref 0.0–3.0)
Eosinophils Absolute: 0.3 10*3/uL (ref 0.0–0.7)
Eosinophils Relative: 4.2 % (ref 0.0–5.0)
HCT: 30.6 % — ABNORMAL LOW (ref 36.0–46.0)
Hemoglobin: 10.1 g/dL — ABNORMAL LOW (ref 12.0–15.0)
Lymphocytes Relative: 50.1 % — ABNORMAL HIGH (ref 12.0–46.0)
Lymphs Abs: 3.3 10*3/uL (ref 0.7–4.0)
MCHC: 33.1 g/dL (ref 30.0–36.0)
MCV: 91.2 fl (ref 78.0–100.0)
Monocytes Absolute: 0.7 10*3/uL (ref 0.1–1.0)
Monocytes Relative: 11 % (ref 3.0–12.0)
Neutro Abs: 2.3 10*3/uL (ref 1.4–7.7)
Neutrophils Relative %: 34 % — ABNORMAL LOW (ref 43.0–77.0)
Platelets: 223 10*3/uL (ref 150.0–400.0)
RBC: 3.36 Mil/uL — ABNORMAL LOW (ref 3.87–5.11)
RDW: 13.1 % (ref 11.5–15.5)
WBC: 6.7 10*3/uL (ref 4.0–10.5)

## 2022-08-24 LAB — LIPID PANEL
Cholesterol: 219 mg/dL — ABNORMAL HIGH (ref 0–200)
HDL: 47.3 mg/dL (ref >39.00)
LDL Cholesterol: 150 mg/dL — ABNORMAL HIGH (ref 0–99)
NonHDL: 172.07
Total CHOL/HDL Ratio: 5
Triglycerides: 111 mg/dL (ref 0.0–149.0)
VLDL: 22.2 mg/dL (ref 0.0–40.0)

## 2022-08-24 LAB — HEMOGLOBIN A1C: Hgb A1c MFr Bld: 6.5 % (ref 4.6–6.5)

## 2022-08-24 LAB — MICROALBUMIN / CREATININE URINE RATIO
Creatinine,U: 156.8 mg/dL
Microalb Creat Ratio: 1.3 mg/g (ref 0.0–30.0)
Microalb, Ur: 2 mg/dL — ABNORMAL HIGH (ref 0.0–1.9)

## 2022-08-24 LAB — TSH: TSH: 0.66 u[IU]/mL (ref 0.35–5.50)

## 2022-08-24 MED ORDER — GABAPENTIN 300 MG PO CAPS: ORAL_CAPSULE | ORAL | 5 refills | Status: DC

## 2022-08-24 NOTE — Assessment & Plan Note (Signed)
Repeat hgbA1c and urine microalbumin °

## 2022-08-24 NOTE — Progress Notes (Signed)
Established Patient Visit  Patient: Sandra Curtis   DOB: 1956/03/07   66 y.o. Female  MRN: [id number] Visit Date: 08/24/2022  Subjective:    Chief Complaint  Patient presents with   Follow-up    Diabetes-- pt requesting to have A1c checked. Pt c/o continuous fatigue x2 months, pt states she doesn't sleep at night but later in the day. Back pain still bothering her would like a referral to a different orthopedist    HPI DDD (degenerative disc disease), lumbosacral Acute on chronic back pain, worse with prolonged sitting and walking No improvement with tylenol and tramadol, Outpatient PT Minimal improvement with gabapentin 300mg  at hs. Unable to use NSAIDs due to severe GERD. No weakness, no paresthesia  Increase gabapentin dose Advised to schedule appt with ortho-spine  Type 2 diabetes mellitus without complication, without long-term current use of insulin (HCC) Repeat hgbA1c and urine microalbumin  Atherosclerosis of aorta (HCC) Repeat lipid panel Consider use of crestor?  Positive ANA (antinuclear antibody) Reports chronic and persistent fatigue, joint and back pain, waxing and waning. Eval by Rheumatology: Dr. 09/2020: "History and review of systems is unremarkable for evidence of sjogren syndrome or related disease. Bilateral hand xrays show osteopenia and osteoarthritis without erosive disease. No exocrine gland dysfunction and no other evidence of systemic connective disease at this time."  Check cbc and Tsh today  Reviewed medical, surgical, and social history today  Medications: Outpatient Medications Prior to Visit  Medication Sig   acetaminophen (TYLENOL) 650 MG CR tablet Take 650 mg by mouth every 8 (eight) hours as needed for pain.   albuterol (VENTOLIN HFA) 108 (90 Base) MCG/ACT inhaler INHALE 1 TO 2 PUFFS INTO THE LUNGS EVERY 6 HOURS AS NEEDED   benzonatate (TESSALON) 200 MG capsule Take 1 capsule (200 mg total) by mouth 2 (two)  times daily as needed for cough.   Cholecalciferol (VITAMIN D3) 25 MCG (1000 UT) CAPS Take by mouth.   fluticasone (FLONASE) 50 MCG/ACT nasal spray Place 2 sprays into both nostrils daily.   Nintedanib (OFEV) 100 MG CAPS TAKE 1 CAPSULE BY MOUTH TWICE A  DAY 12 HOURS APART WITH FOOD   omeprazole (PRILOSEC) 40 MG capsule TAKE 1 CAPSULE(40 MG) BY MOUTH TWICE DAILY   traZODone (DESYREL) 50 MG tablet Take 50 mg by mouth at bedtime.   famotidine (PEPCID) 40 MG tablet TAKE 1 TABLET(40 MG) BY MOUTH DAILY (Patient not taking: Reported on 08/24/2022)   No facility-administered medications prior to visit.   Reviewed past medical and social history.   ROS per HPI above  Last metabolic panel Lab Results  Component Value Date   GLUCOSE 93 08/17/2022   NA 139 08/17/2022   K 4.0 08/17/2022   CL 103 08/17/2022   CO2 28 08/17/2022   BUN 16 08/17/2022   CREATININE 1.13 08/17/2022   CALCIUM 9.8 08/17/2022   PROT 7.9 08/17/2022   ALBUMIN 4.6 08/17/2022   BILITOT 0.3 08/17/2022   ALKPHOS 60 08/17/2022   AST 24 08/17/2022   ALT 11 08/17/2022      Objective:  BP 122/75 (BP Location: Right Arm, Patient Position: Sitting, Cuff Size: Normal)   Pulse 77   Temp (!) 97.4 F (36.3 C) (Temporal)   Wt 141 lb 9.6 oz (64.2 kg)   SpO2 97%   BMI 27.65 kg/m      Physical Exam Vitals reviewed.  Cardiovascular:  Rate and Rhythm: Normal rate and regular rhythm.     Pulses: Normal pulses.     Heart sounds: Normal heart sounds.  Pulmonary:     Effort: Pulmonary effort is normal.     Breath sounds: Normal breath sounds.  Musculoskeletal:        General: No swelling, tenderness or signs of injury. Normal range of motion.     Right lower leg: No edema.     Left lower leg: No edema.  Neurological:     Mental Status: She is alert and oriented to person, place, and time.     No results found for any visits on 08/24/22.    Assessment & Plan:    Problem List Items Addressed This Visit        Cardiovascular and Mediastinum   Atherosclerosis of aorta (HCC)    Repeat lipid panel Consider use of crestor?      Relevant Orders   Lipid panel     Endocrine   Type 2 diabetes mellitus without complication, without long-term current use of insulin (HCC)    Repeat hgbA1c and urine microalbumin      Relevant Orders   Hemoglobin A1c   Microalbumin / creatinine urine ratio     Musculoskeletal and Integument   DDD (degenerative disc disease), lumbosacral - Primary    Acute on chronic back pain, worse with prolonged sitting and walking No improvement with tylenol and tramadol, Outpatient PT Minimal improvement with gabapentin 300mg  at hs. Unable to use NSAIDs due to severe GERD. No weakness, no paresthesia  Increase gabapentin dose Advised to schedule appt with ortho-spine      Relevant Medications   gabapentin (NEURONTIN) 300 MG capsule     Other   Positive ANA (antinuclear antibody)    Reports chronic and persistent fatigue, joint and back pain, waxing and waning. Eval by Rheumatology: Dr. 09/2020: "History and review of systems is unremarkable for evidence of sjogren syndrome or related disease. Bilateral hand xrays show osteopenia and osteoarthritis without erosive disease. No exocrine gland dysfunction and no other evidence of systemic connective disease at this time."  Check cbc and Tsh today      Other Visit Diagnoses     Chronic fatigue       Relevant Orders   TSH   CBC with Differential/Platelet      Return in about 4 weeks (around 09/21/2022) for back pain, depression and insomnia.     09/23/2022, NP

## 2022-08-24 NOTE — Assessment & Plan Note (Addendum)
Acute on chronic back pain, worse with prolonged sitting and walking No improvement with tylenol and tramadol, Outpatient PT Minimal improvement with gabapentin 300mg  at hs. Unable to use NSAIDs due to severe GERD. No weakness, no paresthesia  Increase gabapentin dose Advised to schedule appt with ortho-spine

## 2022-08-24 NOTE — Patient Instructions (Signed)
Go to lab Start gabapentin as prescribed Schedule appt with orthopedist

## 2022-08-24 NOTE — Assessment & Plan Note (Addendum)
Repeat lipid panel Consider use of crestor?

## 2022-08-24 NOTE — Assessment & Plan Note (Addendum)
Reports chronic and persistent fatigue, joint and back pain, waxing and waning. Eval by Rheumatology: Dr. Dimple Casey 09/2020: "History and review of systems is unremarkable for evidence of sjogren syndrome or related disease. Bilateral hand xrays show osteopenia and osteoarthritis without erosive disease. No exocrine gland dysfunction and no other evidence of systemic connective disease at this time."  Check cbc and Tsh today

## 2022-08-26 ENCOUNTER — Other Ambulatory Visit: Payer: Self-pay | Admitting: Nurse Practitioner

## 2022-08-26 DIAGNOSIS — I700 Atherosclerosis of aorta: Secondary | ICD-10-CM

## 2022-08-26 MED ORDER — ROSUVASTATIN CALCIUM 10 MG PO TABS: 10.0000 mg | ORAL_TABLET | ORAL | 1 refills | Status: DC

## 2022-08-26 NOTE — Addendum Note (Signed)
Addended by: Alysia Penna L on: 08/26/2022 03:44 PM   Modules accepted: Orders

## 2022-09-17 IMAGING — MG MM DIGITAL DIAGNOSTIC UNILAT*L* W/ TOMO W/ CAD
4 series · 4 of 12 positions shown · non-contrast
Comparison: Previous exam(s).

CLINICAL DATA: 65-year-old female for six-month follow-up of benign
LEFT breast biopsy demonstrating PASH.

EXAM:
DIGITAL DIAGNOSTIC UNILATERAL LEFT MAMMOGRAM WITH TOMOSYNTHESIS AND
CAD
TECHNIQUE: Left digital diagnostic mammography and breast tomosynthesis was
performed. The images were evaluated with computer-aided detection.

[L MLO synth-2D]
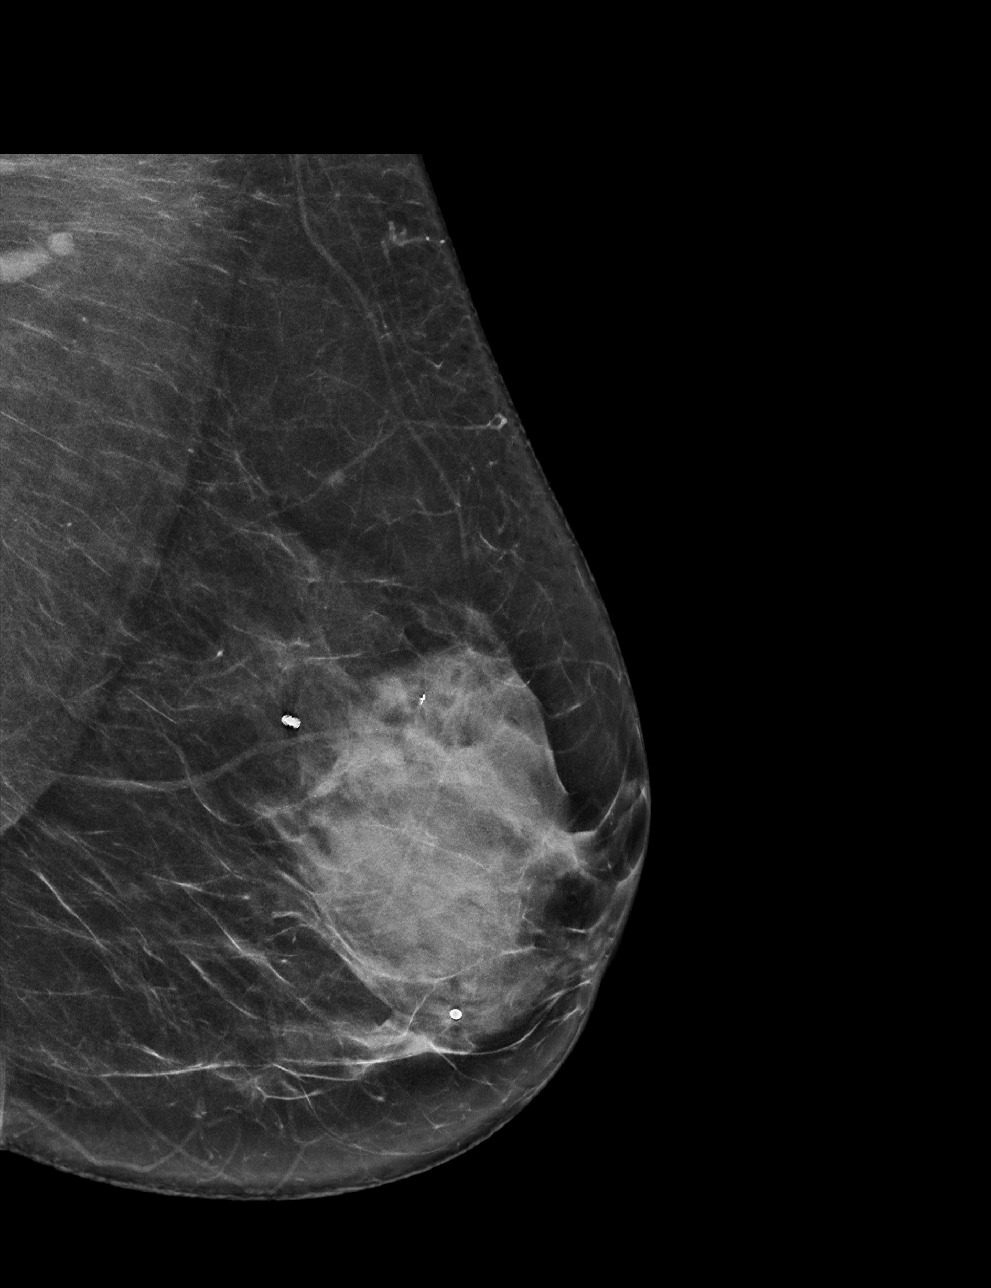

[L CC synth-2D]
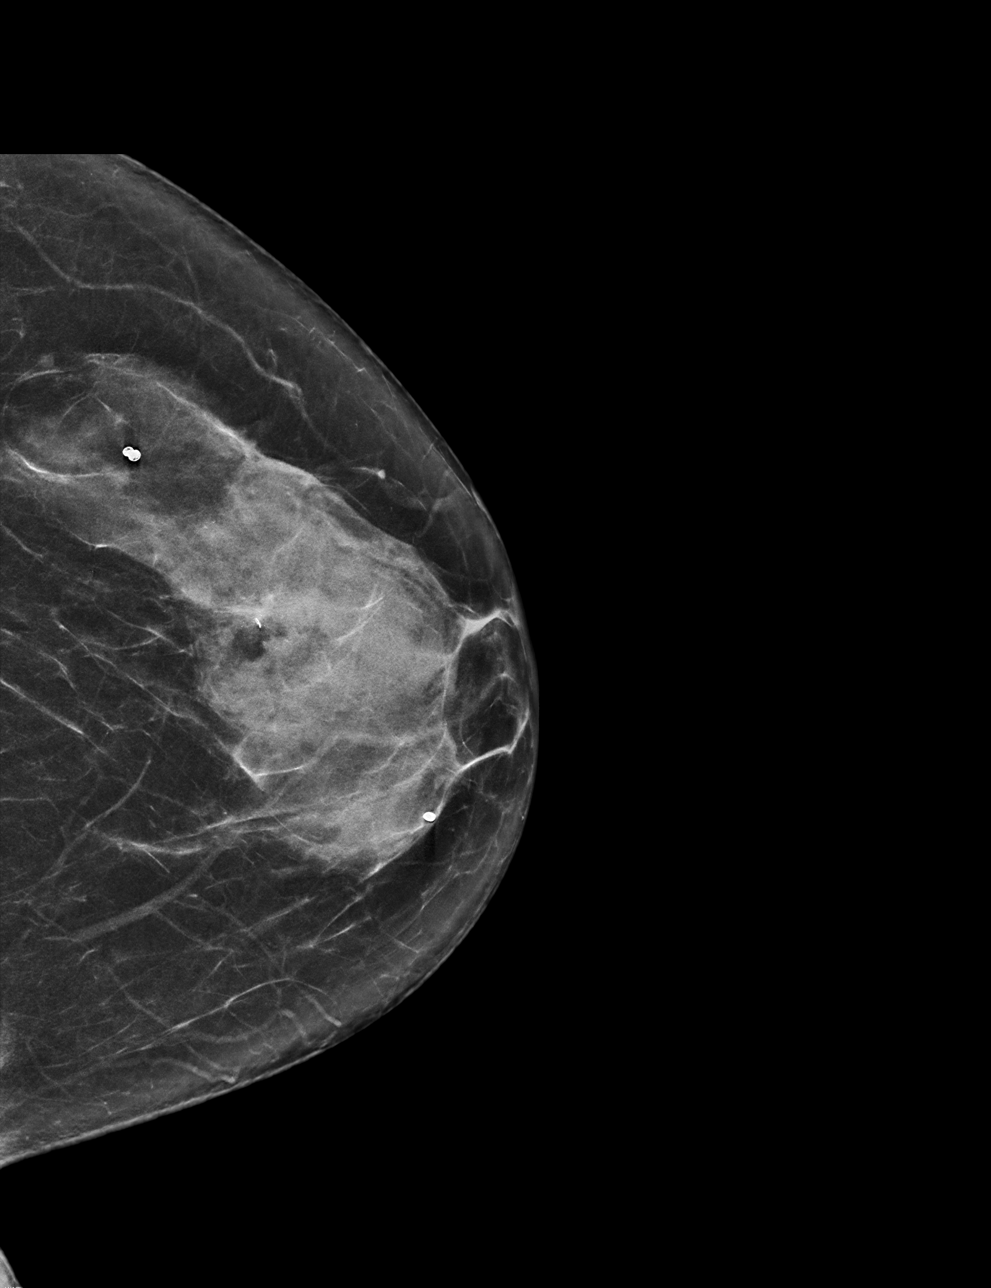

[L CC tomo · tomo slice 33/64.0]
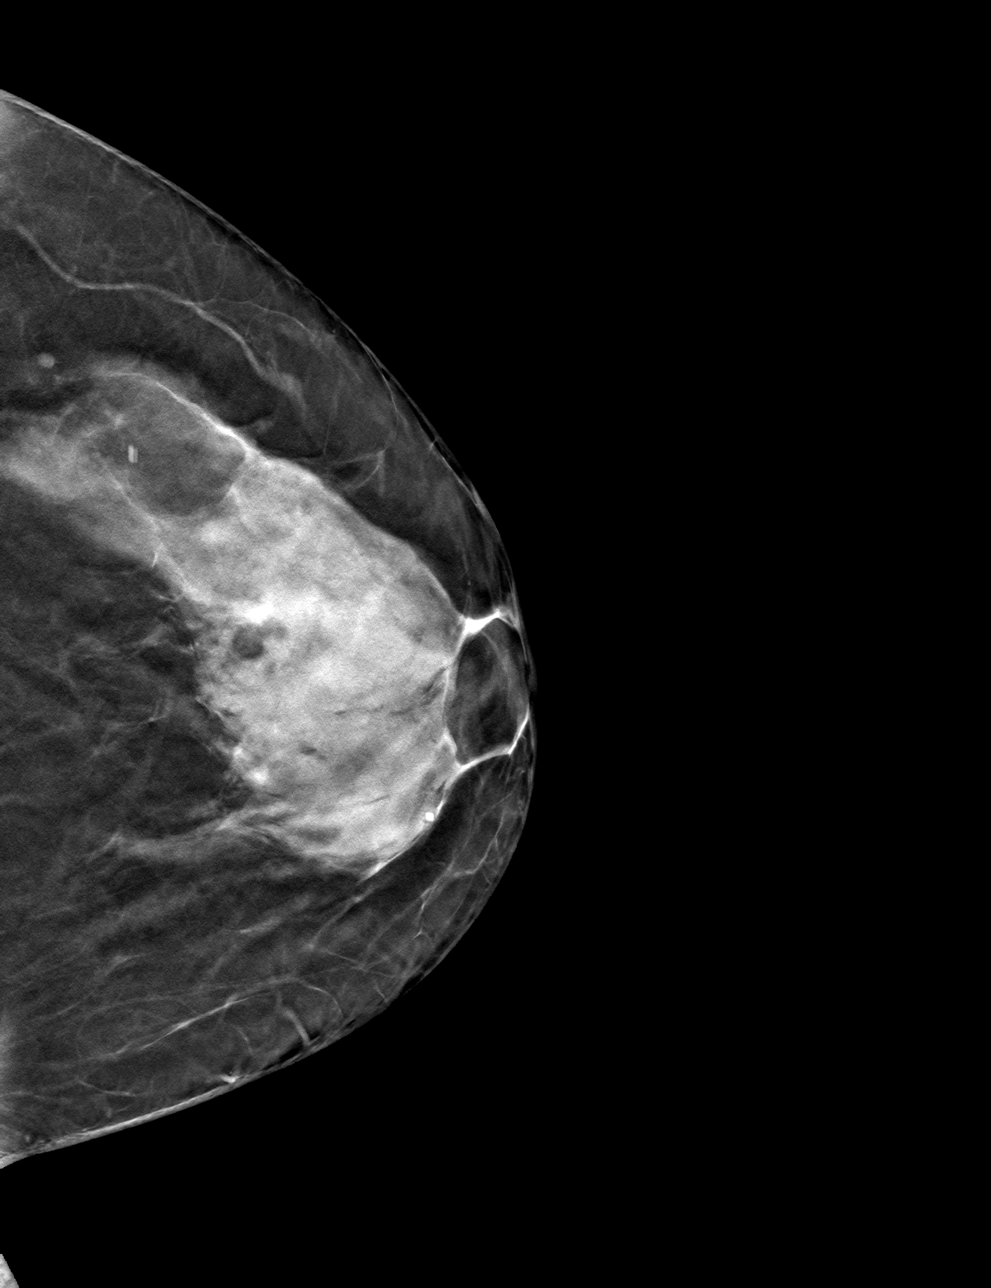

[L MLO tomo · tomo slice 33/66.0]
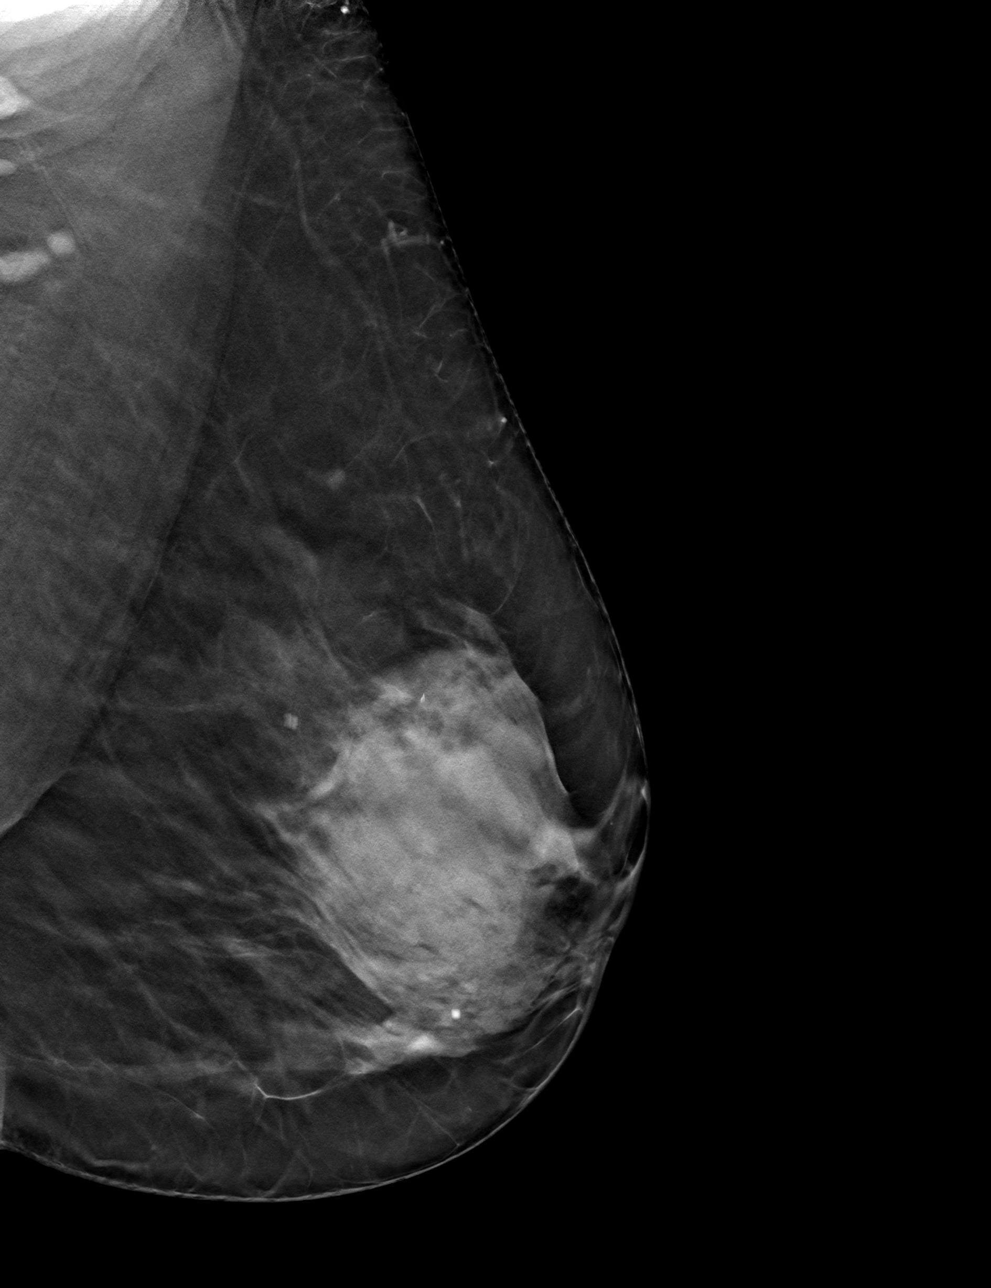

[4 of 12 positions shown; findings below may reference images not displayed]

ACR Breast Density Category c: The breast tissue is heterogeneously
dense, which may obscure small masses.
FINDINGS: Full field views of the LEFT breast demonstrate an X biopsy clip at
the site of biopsied PASH. No new or suspicious abnormalities within
the LEFT breast identified.
IMPRESSION: No new or suspicious abnormalities within the LEFT breast.

RECOMMENDATION:
Bilateral screening mammogram in 6 months to resume annual mammogram
schedule.

I have discussed the findings and recommendations with the patient.
If applicable, a reminder letter will be sent to the patient
regarding the next appointment.

BI-RADS CATEGORY  2: Benign.

## 2022-09-21 ENCOUNTER — Encounter: Payer: Self-pay | Admitting: Nurse Practitioner

## 2022-09-21 ENCOUNTER — Ambulatory Visit (INDEPENDENT_AMBULATORY_CARE_PROVIDER_SITE_OTHER): Payer: Medicare Other | Admitting: Nurse Practitioner

## 2022-09-21 VITALS — BP 118/64 | HR 77 | Temp 97.6°F | Ht 60.0 in | Wt 139.8 lb

## 2022-09-21 DIAGNOSIS — M5137 Other intervertebral disc degeneration, lumbosacral region: Secondary | ICD-10-CM | POA: Diagnosis not present

## 2022-09-21 DIAGNOSIS — I700 Atherosclerosis of aorta: Secondary | ICD-10-CM

## 2022-09-21 DIAGNOSIS — Z23 Encounter for immunization: Secondary | ICD-10-CM

## 2022-09-21 MED ORDER — ROSUVASTATIN CALCIUM 10 MG PO TABS: ORAL_TABLET | ORAL | 3 refills | Status: DC

## 2022-09-21 MED ORDER — GABAPENTIN 300 MG PO CAPS: 300.0000 mg | ORAL_CAPSULE | Freq: Every day | ORAL | 3 refills | Status: DC

## 2022-09-21 NOTE — Assessment & Plan Note (Signed)
No adverse effects with crestor 2x/week

## 2022-09-21 NOTE — Patient Instructions (Signed)
Maintain current medications  

## 2022-09-21 NOTE — Progress Notes (Signed)
Established Patient Visit  Patient: Sandra Curtis   DOB: 05/13/1956   66 y.o. Female  MRN: [id number] Visit Date: 09/21/2022  Subjective:    Chief Complaint  Patient presents with  . Office Visit    Back Pain/ depression / anxiety f/u  No concrns  Pneumonia vaccine given today  Requesting records for eye exam    HPI DDD (degenerative disc disease), lumbosacral Improved back pain with gabapentin 300mg  at hsprn Maintain med dose  Atherosclerosis of aorta (HCC) No adverse effects with crestor 2x/week  Reviewed medical, surgical, and social history today  Medications: Outpatient Medications Prior to Visit  Medication Sig  . acetaminophen (TYLENOL) 650 MG CR tablet Take 650 mg by mouth every 8 (eight) hours as needed for pain.  albuterol (VENTOLIN HFA) 108 (90 Base) MCG/ACT inhaler INHALE 1 TO 2 PUFFS INTO THE LUNGS EVERY 6 HOURS AS NEEDED  . fluticasone (FLONASE) 50 MCG/ACT nasal spray Place 2 sprays into both nostrils daily.  . Nintedanib (OFEV) 100 MG CAPS TAKE 1 CAPSULE BY MOUTH TWICE A  DAY 12 HOURS APART WITH FOOD  . omeprazole (PRILOSEC) 40 MG capsule TAKE 1 CAPSULE(40 MG) BY MOUTH TWICE DAILY  . traZODone (DESYREL) 50 MG tablet Take 50 mg by mouth at bedtime.  . [DISCONTINUED] gabapentin (NEURONTIN) 300 MG capsule Take 1cap at bedtime x3days, 1cap twice a day x 7days, then 1cap three times a day continuously  . [DISCONTINUED] rosuvastatin (CRESTOR) 10 MG tablet TAKE 1 TABLET(10 MG) BY MOUTH 2 TIMES A WEEK  . [DISCONTINUED] benzonatate (TESSALON) 200 MG capsule Take 1 capsule (200 mg total) by mouth 2 (two) times daily as needed for cough. (Patient not taking: Reported on 09/21/2022)  . [DISCONTINUED] Cholecalciferol (VITAMIN D3) 25 MCG (1000 UT) CAPS Take by mouth. (Patient not taking: Reported on 09/21/2022)  . [DISCONTINUED] famotidine (PEPCID) 40 MG tablet TAKE 1 TABLET(40 MG) BY MOUTH DAILY (Patient not taking: Reported on 08/24/2022)   No  facility-administered medications prior to visit.   Reviewed past medical and social history.   ROS per HPI above      Objective:  BP 118/64 (BP Location: Right Arm, Patient Position: Sitting, Cuff Size: Normal)   Pulse 77   Temp 97.6 F (36.4 C) (Temporal)   Ht 5' (1.524 m)   Wt 139 lb 12.8 oz (63.4 kg)   SpO2 98%   BMI 27.30 kg/m      Physical Exam Vitals reviewed.  Cardiovascular:     Rate and Rhythm: Normal rate and regular rhythm.     Pulses: Normal pulses.     Heart sounds: Normal heart sounds.  Pulmonary:     Effort: Pulmonary effort is normal.     Breath sounds: Normal breath sounds.  Neurological:     Mental Status: She is alert and oriented to person, place, and time.    No results found for any visits on 09/21/22.    Assessment & Plan:    Problem List Items Addressed This Visit       Cardiovascular and Mediastinum   Atherosclerosis of aorta (HCC)    No adverse effects with crestor 2x/week      Relevant Medications   rosuvastatin (CRESTOR) 10 MG tablet     Musculoskeletal and Integument   DDD (degenerative disc disease), lumbosacral - Primary    Improved back pain with gabapentin 300mg  at hsprn Maintain med dose  Relevant Medications   gabapentin (NEURONTIN) 300 MG capsule   Other Visit Diagnoses     Need for vaccination against Streptococcus pneumoniae       Relevant Orders   Pneumococcal conjugate vaccine 20-valent (Prevnar 20) (Completed)      Return in about 3 months (around 12/21/2022) for DM, HTN, hyperlipidemia (fasting).     Alysia Penna, NP

## 2022-09-21 NOTE — Assessment & Plan Note (Signed)
Improved back pain with gabapentin 300mg  at hsprn Maintain med dose

## 2022-09-22 LAB — HM DIABETES EYE EXAM

## 2022-11-13 ENCOUNTER — Other Ambulatory Visit: Payer: Self-pay | Admitting: Gastroenterology

## 2022-12-21 ENCOUNTER — Ambulatory Visit: Payer: No Typology Code available for payment source | Admitting: Nurse Practitioner

## 2022-12-21 ENCOUNTER — Encounter: Payer: Self-pay | Admitting: Nurse Practitioner

## 2022-12-21 VITALS — BP 120/68 | HR 96 | Temp 97.7°F | Resp 16 | Ht 60.0 in | Wt 146.8 lb

## 2022-12-21 DIAGNOSIS — Z780 Asymptomatic menopausal state: Secondary | ICD-10-CM | POA: Diagnosis not present

## 2022-12-21 DIAGNOSIS — E785 Hyperlipidemia, unspecified: Secondary | ICD-10-CM

## 2022-12-21 DIAGNOSIS — J84112 Idiopathic pulmonary fibrosis: Secondary | ICD-10-CM | POA: Diagnosis not present

## 2022-12-21 DIAGNOSIS — Z8781 Personal history of (healed) traumatic fracture: Secondary | ICD-10-CM | POA: Diagnosis not present

## 2022-12-21 DIAGNOSIS — N1831 Chronic kidney disease, stage 3a: Secondary | ICD-10-CM

## 2022-12-21 DIAGNOSIS — E1169 Type 2 diabetes mellitus with other specified complication: Secondary | ICD-10-CM | POA: Diagnosis not present

## 2022-12-21 DIAGNOSIS — I700 Atherosclerosis of aorta: Secondary | ICD-10-CM

## 2022-12-21 DIAGNOSIS — M5137 Other intervertebral disc degeneration, lumbosacral region: Secondary | ICD-10-CM | POA: Diagnosis not present

## 2022-12-21 LAB — HEMOGLOBIN A1C: Hgb A1c MFr Bld: 6.4 % (ref 4.6–6.5)

## 2022-12-21 MED ORDER — GABAPENTIN 300 MG PO CAPS: 300.0000 mg | ORAL_CAPSULE | Freq: Two times a day (BID) | ORAL | 3 refills | Status: DC

## 2022-12-21 NOTE — Assessment & Plan Note (Signed)
We discussed use of fosamax vs prolia. She opted for fosamax rx. Advised about possible GI side effects. She verbalized understanding. Obtain dexa scan

## 2022-12-21 NOTE — Patient Instructions (Signed)
Go to lab Schedule appt with Dr. Lorin Mercy

## 2022-12-21 NOTE — Assessment & Plan Note (Signed)
Followed by pulmonology Current use of OFEV No cough or SOB or weight loss

## 2022-12-21 NOTE — Progress Notes (Signed)
Established Patient Visit  Patient: Sandra Curtis   DOB: [birth year]   67 y.o. Female  MRN: [id number] Visit Date: 12/21/2022  Subjective:    Chief Complaint  Patient presents with   Hypertension    Fasting- Yes    Hypertension   DDD (degenerative disc disease), lumbosacral Controlled back pain with gabapentin '300mg'$  BID  Hx of compression fracture of spine We discussed use of fosamax vs prolia. She opted for fosamax rx. Advised about possible GI side effects. She verbalized understanding. Obtain dexa scan  Type 2 diabetes mellitus without complication, without long-term current use of insulin (HCC) Diet controlled BP at goal No neuropathy No retinopathy No nephropathy. Stable CKD-3a LDL not at goal, current use of crestor  Repeat labs today  BP Readings from Last 3 Encounters:  12/21/22 120/68  09/21/22 118/64  08/24/22 122/75    Wt Readings from Last 3 Encounters:  12/21/22 146 lb 12.8 oz (66.6 kg)  09/21/22 139 lb 12.8 oz (63.4 kg)  08/24/22 141 lb 9.6 oz (64.2 kg)    Reviewed medical, surgical, and social history today  Medications: Outpatient Medications Prior to Visit  Medication Sig   famotidine (PEPCID) 40 MG tablet Take 40 mg by mouth at bedtime.   omeprazole (PRILOSEC) 40 MG capsule Take 40 mg by mouth in the morning.   acetaminophen (TYLENOL) 650 MG CR tablet Take 650 mg by mouth every 8 (eight) hours as needed for pain.   albuterol (VENTOLIN HFA) 108 (90 Base) MCG/ACT inhaler INHALE 1 TO 2 PUFFS INTO THE LUNGS EVERY 6 HOURS AS NEEDED   fluticasone (FLONASE) 50 MCG/ACT nasal spray Place 2 sprays into both nostrils daily.   Nintedanib (OFEV) 100 MG CAPS TAKE 1 CAPSULE BY MOUTH TWICE A  DAY 12 HOURS APART WITH FOOD   rosuvastatin (CRESTOR) 10 MG tablet TAKE 1 TABLET(10 MG) BY MOUTH 2 TIMES A WEEK   traZODone (DESYREL) 50 MG tablet Take 50 mg by mouth at bedtime.   [DISCONTINUED] gabapentin (NEURONTIN) 300 MG capsule Take 1 capsule  (300 mg total) by mouth at bedtime.   [DISCONTINUED] omeprazole (PRILOSEC) 40 MG capsule TAKE 1 CAPSULE(40 MG) BY MOUTH TWICE DAILY (Patient not taking: Reported on 12/21/2022)   No facility-administered medications prior to visit.   Reviewed past medical and social history.   ROS per HPI above  Last metabolic panel Lab Results  Component Value Date   GLUCOSE 93 08/17/2022   NA 139 08/17/2022   K 4.0 08/17/2022   CL 103 08/17/2022   CO2 28 08/17/2022   BUN 16 08/17/2022   CREATININE 1.13 08/17/2022   CALCIUM 9.8 08/17/2022   PROT 7.9 08/17/2022   ALBUMIN 4.6 08/17/2022   BILITOT 0.3 08/17/2022   ALKPHOS 60 08/17/2022   AST 24 08/17/2022   ALT 11 08/17/2022   Last lipids Lab Results  Component Value Date   CHOL 219 (H) 08/24/2022   HDL 47.30 08/24/2022   LDLCALC 150 (H) 08/24/2022   TRIG 111.0 08/24/2022   CHOLHDL 5 08/24/2022   Last hemoglobin A1c Lab Results  Component Value Date   HGBA1C 6.5 08/24/2022      Objective:  BP 120/68 (BP Location: Left Arm, Patient Position: Sitting, Cuff Size: Normal)   Pulse 96   Temp 97.7 F (36.5 C) (Temporal)   Resp 16   Ht 5' (1.524 m)   Wt 146 lb 12.8 oz (66.6 kg)  SpO2 96%   BMI 28.67 kg/m      Physical Exam Cardiovascular:     Rate and Rhythm: Normal rate and regular rhythm.     Pulses: Normal pulses.     Heart sounds: Normal heart sounds.  Pulmonary:     Effort: Pulmonary effort is normal.     Breath sounds: Normal breath sounds.  Neurological:     Mental Status: She is alert and oriented to person, place, and time.     No results found for any visits on 12/21/22.    Assessment & Plan:    Problem List Items Addressed This Visit       Cardiovascular and Mediastinum   Atherosclerosis of aorta (HCC)   Relevant Orders   Lipid panel     Endocrine   Hyperlipidemia associated with type 2 diabetes mellitus ([place])   Relevant Orders   Hemoglobin A1c   Type 2 diabetes mellitus without complication,  without long-term current use of insulin (HCC) - Primary    Diet controlled BP at goal No neuropathy No retinopathy No nephropathy. Stable CKD-3a LDL not at goal, current use of crestor  Repeat labs today         Musculoskeletal and Integument   DDD (degenerative disc disease), lumbosacral    Controlled back pain with gabapentin '300mg'$  BID      Relevant Medications   gabapentin (NEURONTIN) 300 MG capsule   Hx of compression fracture of spine    We discussed use of fosamax vs prolia. She opted for fosamax rx. Advised about possible GI side effects. She verbalized understanding. Obtain dexa scan      Relevant Orders   HM DEXA SCAN (Completed)   VITAMIN D 25 Hydroxy (Vit-D Deficiency, Fractures)     Genitourinary   Stage 3a chronic kidney disease ([place])   Relevant Orders   Renal Function Panel   Other Visit Diagnoses     IPF (idiopathic pulmonary fibrosis) ([place])   (Chronic)     Asymptomatic postmenopausal estrogen deficiency       Relevant Orders   HM DEXA SCAN (Completed)   VITAMIN D 25 Hydroxy (Vit-D Deficiency, Fractures)      Return in about 6 months (around 06/23/2023) for HTN, DM, hyperlipidemia (fasting).     Wilfred Lacy, NP

## 2022-12-21 NOTE — Assessment & Plan Note (Addendum)
Diet controlled BP at goal No neuropathy No retinopathy No nephropathy. Stable CKD-3a LDL not at goal, current use of crestor  Repeat labs today

## 2022-12-21 NOTE — Assessment & Plan Note (Signed)
Controlled back pain with gabapentin '300mg'$  BID

## 2022-12-22 ENCOUNTER — Encounter: Payer: Self-pay | Admitting: Nurse Practitioner

## 2022-12-22 LAB — RENAL FUNCTION PANEL
Albumin: 4.3 g/dL (ref 3.5–5.2)
BUN: 16 mg/dL (ref 6–23)
CO2: 25 mEq/L (ref 19–32)
Calcium: 9.6 mg/dL (ref 8.4–10.5)
Chloride: 103 mEq/L (ref 96–112)
Creatinine, Ser: 1.4 mg/dL — ABNORMAL HIGH (ref 0.40–1.20)
GFR: 39.14 mL/min — ABNORMAL LOW (ref >60.00)
Glucose, Bld: 91 mg/dL (ref 70–99)
Phosphorus: 4.2 mg/dL (ref 2.3–4.6)
Potassium: 4.3 mEq/L (ref 3.5–5.1)
Sodium: 139 mEq/L (ref 135–145)

## 2022-12-22 LAB — LIPID PANEL
Cholesterol: 217 mg/dL — ABNORMAL HIGH (ref 0–200)
HDL: 56.3 mg/dL (ref >39.00)
LDL Cholesterol: 134 mg/dL — ABNORMAL HIGH (ref 0–99)
NonHDL: 161.07
Total CHOL/HDL Ratio: 4
Triglycerides: 133 mg/dL (ref 0.0–149.0)
VLDL: 26.6 mg/dL (ref 0.0–40.0)

## 2022-12-23 ENCOUNTER — Encounter: Payer: Self-pay | Admitting: Nurse Practitioner

## 2022-12-23 MED ORDER — ROSUVASTATIN CALCIUM 10 MG PO TABS: ORAL_TABLET | ORAL | 3 refills | Status: DC

## 2022-12-23 NOTE — Progress Notes (Signed)
Abnormal: Stable hgbA1c at 6.4% Decline in renal function: avoid all NSAIDs (ibuprofen, naproxen, Aleve, Advil, motrin, and BC powder) Maintain adequate oral hydration: 64oz daily Elevated lipid panel: total cholesterol and LDL. LDL should be 70 or less. Need to maintain mediterranean diet and increase crestor dose to 10mg  3x/week. Return to lab for repeat renal function in 55month Schedule lab appt

## 2022-12-23 NOTE — Addendum Note (Signed)
Addended by: Wilfred Lacy L on: 12/23/2022 04:01 PM   Modules accepted: Orders

## 2022-12-27 ENCOUNTER — Other Ambulatory Visit: Payer: No Typology Code available for payment source

## 2022-12-27 ENCOUNTER — Ambulatory Visit: Payer: No Typology Code available for payment source | Admitting: Orthopaedic Surgery

## 2022-12-27 DIAGNOSIS — Z8781 Personal history of (healed) traumatic fracture: Secondary | ICD-10-CM | POA: Diagnosis not present

## 2022-12-27 DIAGNOSIS — M419 Scoliosis, unspecified: Secondary | ICD-10-CM | POA: Diagnosis not present

## 2022-12-27 DIAGNOSIS — M545 Low back pain, unspecified: Secondary | ICD-10-CM | POA: Diagnosis not present

## 2022-12-27 DIAGNOSIS — G8929 Other chronic pain: Secondary | ICD-10-CM | POA: Diagnosis not present

## 2022-12-27 NOTE — Progress Notes (Signed)
Office Visit Note   Patient: Sandra Curtis           Date of Birth: Feb 04, 1956           MRN: [id number] Visit Date: 12/27/2022              Requested by: Flossie Buffy, NP [place],  [place] [id number] PCP: Flossie Buffy, NP   Assessment & Plan: Visit Diagnoses:  1. Chronic bilateral low back pain without sciatica   2. Hx of compression fracture of spine   3. Scoliosis of lumbar spine, unspecified scoliosis type     Plan: Patient has been on calcium and vitamin D.  She needs a bone density test she had a bone density test 4 years ago that showed osteopenia.  She has not had a repeat follow-up to check on response to treatment.  She may need a biphosphonate added to her treatment.  Follow-up 6 weeks.  Follow-Up Instructions: No follow-ups on file.   Orders:  Orders Placed This Encounter  Procedures   XR Lumbar Spine 2-3 Views   No orders of the defined types were placed in this encounter.     Procedures: No procedures performed   Clinical Data: No additional findings.   Subjective: Chief Complaint  Patient presents with   Lower Back - Pain    HPI [age]-year-old female seen with chronic back pain.  Last seen 08/10/2021.  She has aching pain when she gets up problems walking.  She lives on the second floor when she takes her groceries up she can only take 2 or 3 bags and asked her go up and down the stairs extra times to get all her groceries upstairs.  She has type 2 diabetes without long-term use of insulin.  Previous L1 compression fracture 2019.  Review of Systems no associated bowel or bladder symptoms.  Positive for idiopathic pulmonary fibrosis with dyspnea on exertion.  She been on steroids off and on in the past.   Objective: Vital Signs: There were no vitals taken for this visit.  Physical Exam Constitutional:      Appearance: She is well-developed.  HENT:     Head: Normocephalic.     Right Ear: External ear  normal.     Left Ear: External ear normal. There is no impacted cerumen.  Eyes:     Pupils: Pupils are equal, round, and reactive to light.  Neck:     Thyroid: No thyromegaly.     Trachea: No tracheal deviation.  Cardiovascular:     Rate and Rhythm: Normal rate.  Pulmonary:     Effort: Pulmonary effort is normal.  Abdominal:     Palpations: Abdomen is soft.  Musculoskeletal:     Cervical back: No rigidity.  Skin:    General: Skin is warm and dry.  Neurological:     Mental Status: She is alert and oriented to person, place, and time.  Psychiatric:        Behavior: Behavior normal.     Ortho Exam no new root tension signs.  Some tenderness lumbosacral junction no sciatic notch tenderness negative popliteal compression test anterior tib EHL heel and toe walking is normal.  Specialty Comments:  No specialty comments available.  Imaging: No results found.   PMFS History: Patient Active Problem List   Diagnosis Date Noted   Hx of compression fracture of spine 12/21/2022   Stage 3a chronic kidney disease ([place]) 12/21/2022   Hyperlipidemia associated  with type 2 diabetes mellitus ([place]) 12/21/2022   Atherosclerosis of aorta ([place]) 01/12/2021   Primary insomnia 01/12/2021   Positive ANA (antinuclear antibody) 09/09/2020   IPF (idiopathic pulmonary fibrosis) ([place]) 06/26/2020   Chronic cough 05/13/2020   Atrophic vaginitis 01/09/2019   Menopausal flushing 01/09/2019   Colon polyp, hyperplastic 01/09/2019   Chronic back pain 07/20/2018   Type 2 diabetes mellitus without complication, without long-term current use of insulin ([place]) 12/11/2017   Normocytic anemia 12/11/2017   Depressive disorder 02/06/2017   Gastroesophageal reflux disease with esophagitis 02/06/2017   DDD (degenerative disc disease), lumbosacral 02/06/2017   Dysphagia 02/06/2017   Scoliosis of lumbar spine 02/06/2017   Aortic regurgitation 10/02/2013   Headache 03/01/2012   Seasonal allergic rhinitis  03/01/2012   Past Medical History:  Diagnosis Date   Allergic rhinitis    Allergy    Chronic back pain 07/20/2018   Chronic midline low back pain 02/06/2017   Depression    Esophageal stricture    GERD (gastroesophageal reflux disease)    Inflammatory polyps of colon ([place]) 2010   Pulmonary filariasis (HCC)    Systolic murmur    UTI (urinary tract infection)     Family History  Problem Relation Age of Onset   Arthritis Mother    Thyroid disease Mother    Heart disease Father    Hypertension Father    Diabetes Father    Arthritis Sister    Diabetes Brother    Colon cancer Neg Hx    Stomach cancer Neg Hx    Rectal cancer Neg Hx    Esophageal cancer Neg Hx     Past Surgical History:  Procedure Laterality Date   BREAST BIOPSY Left 01/22/2021   COLONOSCOPY     UPPER GASTROINTESTINAL ENDOSCOPY     Social History   Occupational History   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

## 2023-01-04 ENCOUNTER — Ambulatory Visit: Payer: No Typology Code available for payment source

## 2023-01-04 ENCOUNTER — Encounter: Payer: No Typology Code available for payment source | Admitting: Nurse Practitioner

## 2023-01-13 ENCOUNTER — Ambulatory Visit: Payer: No Typology Code available for payment source

## 2023-01-13 ENCOUNTER — Ambulatory Visit: Payer: No Typology Code available for payment source | Admitting: Nurse Practitioner

## 2023-01-13 ENCOUNTER — Encounter: Payer: Self-pay | Admitting: Nurse Practitioner

## 2023-01-13 ENCOUNTER — Telehealth: Payer: Self-pay

## 2023-01-13 VITALS — BP 116/64 | HR 81 | Temp 97.0°F | Resp 16 | Ht 60.0 in | Wt 146.0 lb

## 2023-01-13 DIAGNOSIS — M5137 Other intervertebral disc degeneration, lumbosacral region: Secondary | ICD-10-CM

## 2023-01-13 DIAGNOSIS — Z0001 Encounter for general adult medical examination with abnormal findings: Secondary | ICD-10-CM | POA: Diagnosis not present

## 2023-01-13 DIAGNOSIS — Z1231 Encounter for screening mammogram for malignant neoplasm of breast: Secondary | ICD-10-CM

## 2023-01-13 DIAGNOSIS — G8929 Other chronic pain: Secondary | ICD-10-CM

## 2023-01-13 DIAGNOSIS — Z0000 Encounter for general adult medical examination without abnormal findings: Secondary | ICD-10-CM

## 2023-01-13 MED ORDER — PREGABALIN 50 MG PO CAPS
50.0000 mg | ORAL_CAPSULE | Freq: Two times a day (BID) | ORAL | 5 refills | Status: DC
Start: 2023-01-13 — End: 2023-04-26

## 2023-01-13 NOTE — Patient Instructions (Addendum)
Stop gabapentin Start lyrica.  Sandra Curtis , Thank you for taking time to come for your Medicare Wellness Visit. I appreciate your ongoing commitment to your health goals. Please review the following plan we discussed and let me know if I can assist you in the future.   These are the goals we discussed: Get RSV and COVID vaccine at retail pharmacy. Completed living will and HCPOA forms, sign in front of a Clinical research associate. Bring copy to office. Schedule appt for mammogram. Maintain appt for dexa scan  This is a list of the screening recommended for you and due dates:  Health Maintenance  Topic Date Due   COVID-19 Vaccine (2 - 2023-24 season) 06/10/2022   Mammogram  12/31/2022   Zoster (Shingles) Vaccine (1 of 2) 03/23/2023*   Flu Shot  05/11/2023   Hemoglobin A1C  06/23/2023   Yearly kidney health urinalysis for diabetes  08/25/2023   Complete foot exam   09/22/2023   Eye exam for diabetics  09/23/2023   Yearly kidney function blood test for diabetes  12/21/2023   Colon Cancer Screening  01/04/2024   Medicare Annual Wellness Visit  01/13/2024   DTaP/Tdap/Td vaccine (2 - Td or Tdap) 12/09/2027   Pneumonia Vaccine  Completed   DEXA scan (bone density measurement)  Completed   Hepatitis C Screening: USPSTF Recommendation to screen - Ages 84-79 yo.  Completed   HPV Vaccine  Aged Out  *Topic was postponed. The date shown is not the original due date.

## 2023-01-13 NOTE — Patient Outreach (Signed)
  Care Coordination   In Person Provider Office Visit Note   01/13/2023 Name: Constantine Isenberg MRN: [id number] DOB: [birth year]  Margine Ufford is a 67 y.o. year old female who sees Nche, Bonna Gains, NP for primary care. I engaged with [name] in the providers office today.  What matters to the patients health and wellness today?  Maintain health    Goals Addressed             This Visit's Progress    Care Coordination Activities-no follow up required       Care Coordination Interventions: Advised patient to Annual Wellness exam. Discussed [facility] services and support. Assessed SDOH. Advised to discuss with primary care physician if services needed in the future.        SDOH assessments and interventions completed:  Yes     Care Coordination Interventions:  Yes, provided   Follow up plan: No further intervention required.   Encounter Outcome:  Pt. Visit Completed   Bary Leriche, RN, MSN [facility] Care Management Care Management Coordinator Direct Line [phone]

## 2023-01-13 NOTE — Progress Notes (Signed)
Sandra Curtis.cna   Annual Wellness Visit   Patient: Sandra GrieveHenrietta [name] Curtis, Female    DOB: 1956/04/20, 67 y.o.   MRN: [id number] Visit Date: 01/13/2023  Subjective:    Chief Complaint  Patient presents with   welcome to medicare   Sandra Curtis is a 67 y.o. female who presents today for her Annual Wellness Visit.  HPI Here for medicare wellness, no new complaints. Please see A/P for status and treatment of chronic medical problems.   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 The patient's medical and social history - reviewed today no changes 2. Their use of alcohol, tobacco or illicit drugs 3. Their current medications and supplements 4. The patient's functional ability including ADL's, fall risks, home safety risks and hearing or visual impairment. 5. Diet and physical activities 6. Evidence for depression or mood disorders 7. Care team reviewed and updated (available in snapshot)   Medications: Outpatient Medications Prior to Visit  Medication Sig   acetaminophen (TYLENOL) 650 MG CR tablet Take 650 mg by mouth every 8 (eight) hours as needed for pain.   albuterol (VENTOLIN HFA) 108 (90 Base) MCG/ACT inhaler INHALE 1 TO 2 PUFFS INTO THE LUNGS EVERY 6 HOURS AS NEEDED   famotidine (PEPCID) 40 MG tablet Take 40 mg by mouth at bedtime.   fluticasone (FLONASE) 50 MCG/ACT nasal spray Place 2 sprays into both nostrils daily.   Nintedanib (OFEV) 100 MG CAPS TAKE 1 CAPSULE BY MOUTH TWICE A  DAY 12 HOURS APART  WITH FOOD   omeprazole (PRILOSEC) 40 MG capsule Take 40 mg by mouth in the morning.   rosuvastatin (CRESTOR) 10 MG tablet TAKE 1 TABLET(10 MG) BY MOUTH 3 TIMES A WEEK   traZODone (DESYREL) 50 MG tablet Take 50 mg by mouth at bedtime.   [DISCONTINUED] gabapentin (NEURONTIN) 300 MG capsule Take 1 capsule (300 mg total) by mouth 2 (two) times daily.   No facility-administered medications prior to visit.    Allergies  Allergen Reactions   Iodine Hives    Hives    Patient Care Team: Sada Mazzoni, Bonna Gainsharlotte Lum, NP as PCP - General (Internal Medicine) Burundiman, Heather, OD (Optometry) Dr. Annell GreeningMark Yates- orthopedics Dr. Chilton GreathousePraveen Mannam- Pulmonology Dr. Raynelle DickMalcolm Starks- Gastroenterology Dr. Chestine Sporelark- GYN  Review of Systems     Objective:    Vitals: BP 116/64 (BP Location: Right Arm, Patient Position: Sitting, Cuff Size: Large)   Pulse 81   Temp (!) 97 F (36.1 C) (Temporal)   Resp 16   Ht 5' (1.524 m)   Wt 146 lb (66.2 kg)   SpO2 93%   BMI 28.51 kg/m  BP Readings from Last 3 Encounters:  01/13/23 116/64  12/21/22 120/68  09/21/22 118/64   Wt Readings from Last 3 Encounters:  01/13/23 146 lb (66.2 kg)  12/21/22 146 lb 12.8 oz (66.6 kg)  09/21/22 139 lb 12.8 oz (63.4 kg)   Physical Exam  Most recent functional status assessment:    01/13/2023    1:17 PM  In your present state of health, do you have any difficulty performing the following activities:  Hearing?  0  Vision? 0  Difficulty concentrating or making decisions? 0  Walking or climbing stairs? 0  Dressing or bathing? 0  Doing errands, shopping? 0   Most recent fall risk assessment:    12/21/2022    1:00 PM  Fall Risk   Falls in the past year? 0  Number falls in past yr: 0  Injury with Fall? 0  Risk for fall due to : No Fall Risks  Follow up Falls evaluation completed   Most recent depression screenings:    01/13/2023    9:22 AM 09/21/2022    1:43 PM  PHQ 2/9 Scores  PHQ - 2 Score 0 1  PHQ- 9 Score  6      01/13/2023    10:24 AM  MMSE - Mini Mental State Exam  Orientation to time 4  Orientation to Place 5  Registration 3  Attention/ Calculation 5  Recall 3  Language- name 2 objects 1  Language- repeat 1  Language- follow 3 step command 3  Language- read & follow direction 1  Write a sentence 1  Copy design 1  Total score 28    Hearing Screening  Method: Audiometry   500Hz  1000Hz  3000Hz  4000Hz   Right ear Pass Pass Pass Pass  Left ear Pass Pass Pass Pass   Vision Screening   Right eye Left eye Both eyes  Without correction 20/25 20/25 20/25   With correction      No results found for any visits on 01/13/23.     Assessment & Plan:      Annual wellness visit done today including the all of the following: Reviewed patient's Family Medical History Reviewed and updated list of patient's medical providers Assessment of cognitive impairment was done Assessed patient's functional ability Established a written schedule for health screening services Health Risk Assessent Completed and Reviewed  Exercise Activities and Dietary recommendations:  Discussed health benefits of physical activity, and encouraged her to engage in regular exercise appropriate for her age and condition. For example chair exercises Get RSV and COVID vaccine at retail pharmacy. Completed living will and HCPOA forms, sign in front of a Clinical research associatelawyer. Bring copy to office. Schedule appt for mammogram. Maintain appt for dexa scan   Goals      Care Coordination Activities-no follow up required     Care Coordination Interventions: Advised patient to Annual Wellness exam. [facility]ed [facility] and support. Assessed SDOH. Advised to discuss with primary care physician if services needed in the future.       Immunization History  Administered Date(s) Administered   Fluad Quad(high Dose 65+) 08/17/2022   Janssen (J&J) SARS-COV-2 Vaccination 01/18/2020   PNEUMOCOCCAL CONJUGATE-20 09/21/2022   Pneumococcal Conjugate-13  04/30/2021   Tdap 12/08/2017   Health Maintenance  Topic Date Due   COVID-19 Vaccine (2 - 2023-24 season) 06/10/2022   MAMMOGRAM  12/31/2022   Zoster Vaccines- Shingrix (1 of 2) 03/23/2023 (Originally 03/23/2006)   INFLUENZA VACCINE  05/11/2023   HEMOGLOBIN A1C  06/23/2023   Diabetic kidney evaluation - Urine ACR  08/25/2023   FOOT EXAM  09/22/2023   OPHTHALMOLOGY EXAM  09/23/2023   Diabetic kidney evaluation - eGFR measurement  12/21/2023   COLONOSCOPY (Pts 45-757yrs Insurance coverage will need to be confirmed)  01/04/2024   Medicare Annual Wellness (AWV)  01/13/2024   DTaP/Tdap/Td (2 - Td or Tdap) 12/09/2027   Pneumonia Vaccine 2665+ Years old  Completed   DEXA SCAN  Completed   Hepatitis C Screening  Completed  HPV VACCINES  Aged Out   Problem List Items Addressed This Visit       Musculoskeletal and Integument   DDD (degenerative disc disease), lumbosacral    Pain controlled with gabapentin in PM, but reports muscle and joint pain in AM. This resolves after several hours in AM. She agreed to swicth  gabapentin to lyrica. Advised about potential sedating effects. F/up in 36months or sooner if needed      Other Visit Diagnoses     Welcome to Medicare preventive visit    -  Primary   Relevant Orders   EKG (Completed)   Breast cancer screening by mammogram       Relevant Orders   MM 3D SCREENING MAMMOGRAM BILATERAL BREAST   Chronic bilateral low back pain without sciatica       Relevant Medications   pregabalin (LYRICA) 50 MG capsule      Return in about 3 months (around 04/14/2023) for DM, hyperlipidemia, CKD (fasting).     Alysia Penna, NP

## 2023-01-13 NOTE — Assessment & Plan Note (Signed)
Pain controlled with gabapentin in PM, but reports muscle and joint pain in AM. This resolves after several hours in AM. She agreed to swicth  gabapentin to lyrica. Advised about potential sedating effects. F/up in 83months or sooner if needed

## 2023-01-14 ENCOUNTER — Other Ambulatory Visit: Payer: Self-pay | Admitting: Nurse Practitioner

## 2023-01-14 DIAGNOSIS — K2100 Gastro-esophageal reflux disease with esophagitis, without bleeding: Secondary | ICD-10-CM

## 2023-01-14 DIAGNOSIS — R053 Chronic cough: Secondary | ICD-10-CM

## 2023-01-16 ENCOUNTER — Encounter: Payer: Self-pay | Admitting: Nurse Practitioner

## 2023-01-23 ENCOUNTER — Encounter: Payer: Self-pay | Admitting: Nurse Practitioner

## 2023-01-24 ENCOUNTER — Other Ambulatory Visit: Payer: Self-pay

## 2023-02-03 ENCOUNTER — Ambulatory Visit
Admission: RE | Admit: 2023-02-03 | Discharge: 2023-02-03 | Disposition: A | Discharge status: Home / Self Care | Payer: No Typology Code available for payment source | Source: Ambulatory Visit | Attending: Nurse Practitioner

## 2023-02-03 DIAGNOSIS — Z1231 Encounter for screening mammogram for malignant neoplasm of breast: Secondary | ICD-10-CM

## 2023-02-06 ENCOUNTER — Telehealth: Payer: Self-pay | Admitting: Pulmonary Disease

## 2023-02-06 ENCOUNTER — Telehealth: Payer: Self-pay | Admitting: Gastroenterology

## 2023-02-06 MED ORDER — FAMOTIDINE 40 MG PO TABS: 40.0000 mg | ORAL_TABLET | Freq: Every day | ORAL | 2 refills | Status: DC

## 2023-02-06 NOTE — Telephone Encounter (Signed)
PT needs more omeprazole called in to [facility] on [address] (This is a new Therapist, occupational.)  [phone]

## 2023-02-06 NOTE — Telephone Encounter (Signed)
Prescription sent to patient's pharmacy.

## 2023-02-06 NOTE — Telephone Encounter (Signed)
PT is calling to get refill on famotidine. It needs to be sent to the [facility] on [address] Please advise.

## 2023-02-07 NOTE — Telephone Encounter (Signed)
Dr. Isaiah Serge can you please advise. Patient is requesting refill of Omeprazole. I advised patient she call Dr. Anselm Jungling office for a refill since she sees him for GERD. Pt states Dr. Daneil Dan name is on prescription.

## 2023-02-07 NOTE — Telephone Encounter (Signed)
Agree that she should get her antiacid medication from Dr. Russella Dar as he is following her for GERD and esophageal stricture

## 2023-02-07 NOTE — Progress Notes (Signed)
Stable Follow instructions as discussed during office visit.

## 2023-02-08 ENCOUNTER — Telehealth: Payer: Self-pay | Admitting: Gastroenterology

## 2023-02-08 MED ORDER — OMEPRAZOLE 40 MG PO CPDR: 40.0000 mg | DELAYED_RELEASE_CAPSULE | Freq: Every morning | ORAL | 2 refills | Status: DC

## 2023-02-08 NOTE — Telephone Encounter (Signed)
Inbound call from patient, would like refill [facility]eprazole [facility] on [address] and [address].

## 2023-02-08 NOTE — Telephone Encounter (Signed)
Prescription sent to patient's pharmacy.

## 2023-02-08 NOTE — Telephone Encounter (Signed)
Spoke with patient. Advised she should contact Dr. Anselm Jungling office for refill on Omeprazole since she follows him for GERD. She verbalized understanding. NFN

## 2023-02-20 DIAGNOSIS — N2581 Secondary hyperparathyroidism of renal origin: Secondary | ICD-10-CM | POA: Diagnosis not present

## 2023-02-20 DIAGNOSIS — N183 Chronic kidney disease, stage 3 unspecified: Secondary | ICD-10-CM | POA: Diagnosis not present

## 2023-02-20 DIAGNOSIS — D631 Anemia in chronic kidney disease: Secondary | ICD-10-CM | POA: Diagnosis not present

## 2023-02-20 DIAGNOSIS — J8410 Pulmonary fibrosis, unspecified: Secondary | ICD-10-CM | POA: Diagnosis not present

## 2023-02-20 DIAGNOSIS — E1122 Type 2 diabetes mellitus with diabetic chronic kidney disease: Secondary | ICD-10-CM | POA: Diagnosis not present

## 2023-02-21 DIAGNOSIS — H40013 Open angle with borderline findings, low risk, bilateral: Secondary | ICD-10-CM | POA: Diagnosis not present

## 2023-03-16 ENCOUNTER — Ambulatory Visit: Payer: No Typology Code available for payment source | Admitting: Pulmonary Disease

## 2023-03-16 ENCOUNTER — Encounter: Payer: Self-pay | Admitting: Pulmonary Disease

## 2023-03-16 VITALS — BP 112/60 | HR 95 | Temp 98.3°F | Ht 60.0 in | Wt 145.8 lb

## 2023-03-16 DIAGNOSIS — R0602 Shortness of breath: Secondary | ICD-10-CM | POA: Diagnosis not present

## 2023-03-16 DIAGNOSIS — J849 Interstitial pulmonary disease, unspecified: Secondary | ICD-10-CM | POA: Diagnosis not present

## 2023-03-16 NOTE — Patient Instructions (Signed)
Continue the Ofev Increase Prilosec to 40 mg twice daily to help with acid reflux Will review labs from your nephrologist Order echocardiogram to evaluate for pulmonary hypertension Get order high-res CT and PFTs in 4 months Return to clinic in 4 months after test.

## 2023-03-16 NOTE — Progress Notes (Signed)
Sandra Curtis    [id number]    Jul 01, 1956  Primary Care Physician:Nche, Bonna Gains, NP  Referring Physician: [name], NP [address] [place],  [place] [address]  Chief complaint: Follow-up for IPF Did not tolerate [name] April 2022  HPI: 67 year old with history of allergies, GERD, esophageal stricture Complains of chronic cough for several years, worsening dyspnea on exertion. She had a CT scan done by Dr. Elease Etienne, primary care showing pulmonary fibrosis and has been referred for further evaluation.  Followed by Dr. Russella Dar, GI with EGD in 2022 showing esophageal stricture secondary to acid reflux.   Has also seen Dr. Dimple Casey, rheumatology for evaluation of elevated ANA and SSA.  He feels she does not have any signs of autoimmune disease.   Discussed at multidisciplinary conference in December 2021 with diagnosis of IPF Started Esbriet in December 2021.  But did not tolerate even at a low dose due to dizziness and headache. Antifibrotic switched to Ofev in April 2022 She is tolerating this better  She has a new diagnosis of CKD and is now followed by Dr. 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  No mold, hot tub, Jacuzzi.  No feather pillows or comforters Smoking history: 18-pack-year smoker.  Quit 1 month ago Travel history: No significant travel history Relevant family history: No significant family issue of lung disease  Interval history: Continues on Ofev 100 mg twice daily.  She has symptoms of fatigue with this but is continuing to take it She is here for review of CT and PFTs.  Outpatient Encounter Medications as of 03/16/2023  Medication Sig   acetaminophen (TYLENOL) 650 MG CR tablet Take 650 mg by mouth every 8 (eight) hours as needed for pain.   albuterol  (VENTOLIN HFA) 108 (90 Base) MCG/ACT inhaler INHALE 1 TO 2 PUFFS INTO THE LUNGS EVERY 6 HOURS AS NEEDED   famotidine (PEPCID) 40 MG tablet Take 1 tablet (40 mg total) by mouth at bedtime.   fluticasone (FLONASE) 50 MCG/ACT nasal spray Place 2 sprays into both nostrils daily.   Nintedanib (OFEV) 100 MG CAPS TAKE 1 CAPSULE BY MOUTH TWICE A  DAY 12 HOURS APART WITH FOOD   omeprazole (PRILOSEC) 40 MG capsule Take 1 capsule (40 mg total) by mouth in the morning.   pregabalin (LYRICA) 50 MG capsule Take 1 capsule (50 mg total) by mouth 2 (two) times daily.   rosuvastatin (CRESTOR) 10 MG tablet TAKE 1 TABLET(10 MG) BY MOUTH 3 TIMES A WEEK   traZODone (DESYREL) 50 MG tablet Take 50 mg by mouth at bedtime.   No facility-administered encounter medications on file as of 03/16/2023.   Physical Exam: Blood pressure 120/62, pulse 85, temperature 98 F (36.7 C), temperature source Oral, height 5' (1.524 m), weight 142 lb 9.6 oz (64.7 kg), SpO2 96 %. Gen:      No acute distress HEENT:  EOMI, sclera anicteric Neck:     No masses; no thyromegaly Lungs:    Bibasal crackles CV:         Regular rate and rhythm; no murmurs Abd:      + bowel sounds; soft, non-tender; no palpable masses, no distension Ext:    No edema; adequate peripheral perfusion Skin:      Warm and dry; no rash Neuro: alert  and oriented x 3 Psych: normal mood and affect   Data Reviewed: Imaging: Chest x-ray 05/15/2020-diffuse interstitial opacities CT chest 06/17/2020-reticulation with traction bronchiectasis, honeycombing most prominent in the right lung base.  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 High-resolution CT 11/02/2021-stable findings of UIP pattern pulmonary fibrosis High resolution CT 07/16/2022-stable findings of UIP pattern pulmonary fibrosis. I have reviewed the images personally.  PFTs: 09/09/2020 FVC 1.80 [85%], FEV1 1.55 [94%], F/F 86, TLC 3.05 [60%], DLCO 10.61 [61%] Mild-moderate restriction and diffusion defect  08/17/2022 FVC  1.74 [65%], FEV1 1.50 [74%], F/F 86, TLC 3.70 [83%], DLCO 9.83 [57%]  Labs: ANA 06/27/2019 1:40, cytoplasmic, rheumatoid factor less than 14 ANA 07/16/2020-1:40, SSA greater than 8  CMP 12/28/21- WNL  Assessment:  IPF CT with UIP pattern fibrosis. She does have mild arthritis symptoms which may be secondary to osteoarthritis but does endorse some morning stiffness and small joint pain.  She had some occupational exposures to textile fiber and dust but no asbestos. Her exposures are not a cause of the pulmonary finrosis No evidence of autoimmune process, connective tissue disease per rheumatology evaluation.  Case discussed at multidisciplinary conference in dec 2021 and diagnosis felt to be consistent with IPF Unfortunately she has not tolerated Esbriet even at a low dose of 1 tablet 3 times a day and is now on Ofev at 100 mg twice daily. Finished pulm rehab We discussed referral to lung transplantation but she would like to hold off for now  She got a comprehensive metabolic panel recently at her nephrologist office and we will get these for review Follow-up high-res CT and PFTs.  CKD Now followed by nephrology Get labs from them for review  Depression She is still struggling with her diagnosis with feelings of depression I have offered referral to psychiatry but she wants to hold off for now Have encouraged her to follow-up with primary care  GERD History notable for significant GERD symptoms, reflux and esophageal stricture.  Underwent stricture dilatation in September 2023 Increase Prilosec to twice daily as she continues to struggle with acid reflux.  Plan/Recommendations: Continue Ofev, monitor hepatic panel Check echocardiogram to evaluate for pulmonary hypertension Order follow-up CT and PFTs in 6 months Tessalon for cough  Chilton Greathouse MD [facility] 03/16/2023, 3:15 PM  CC: Nche, Bonna Gains, NP

## 2023-04-07 ENCOUNTER — Other Ambulatory Visit: Payer: Self-pay | Admitting: Nurse Practitioner

## 2023-04-07 DIAGNOSIS — I700 Atherosclerosis of aorta: Secondary | ICD-10-CM

## 2023-04-07 DIAGNOSIS — E1169 Type 2 diabetes mellitus with other specified complication: Secondary | ICD-10-CM

## 2023-04-07 MED ORDER — ATORVASTATIN CALCIUM 20 MG PO TABS
20.0000 mg | ORAL_TABLET | ORAL | 3 refills | Status: DC
Start: 2023-04-07 — End: 2023-08-22

## 2023-04-17 ENCOUNTER — Ambulatory Visit (HOSPITAL_COMMUNITY): Payer: No Typology Code available for payment source | Attending: Cardiovascular Disease

## 2023-04-17 DIAGNOSIS — R0602 Shortness of breath: Secondary | ICD-10-CM | POA: Diagnosis not present

## 2023-04-17 LAB — ECHOCARDIOGRAM COMPLETE
Area-P 1/2: 4.63 cm2
P 1/2 time: 500 msec
S' Lateral: 2.7 cm

## 2023-04-20 ENCOUNTER — Ambulatory Visit: Payer: No Typology Code available for payment source | Admitting: Nurse Practitioner

## 2023-04-26 ENCOUNTER — Ambulatory Visit (INDEPENDENT_AMBULATORY_CARE_PROVIDER_SITE_OTHER)
Admission: RE | Admit: 2023-04-26 | Discharge: 2023-04-26 | Disposition: A | Discharge status: Home / Self Care | Payer: No Typology Code available for payment source | Source: Ambulatory Visit | Attending: Nurse Practitioner | Admitting: Nurse Practitioner

## 2023-04-26 ENCOUNTER — Encounter: Payer: Self-pay | Admitting: Nurse Practitioner

## 2023-04-26 ENCOUNTER — Ambulatory Visit: Payer: No Typology Code available for payment source | Admitting: Nurse Practitioner

## 2023-04-26 VITALS — BP 120/60 | HR 90 | Temp 97.3°F | Resp 16 | Ht 60.0 in | Wt 145.2 lb

## 2023-04-26 DIAGNOSIS — M5136 Other intervertebral disc degeneration, lumbar region: Secondary | ICD-10-CM | POA: Diagnosis not present

## 2023-04-26 DIAGNOSIS — M419 Scoliosis, unspecified: Secondary | ICD-10-CM | POA: Diagnosis not present

## 2023-04-26 DIAGNOSIS — M5137 Other intervertebral disc degeneration, lumbosacral region: Secondary | ICD-10-CM | POA: Diagnosis not present

## 2023-04-26 DIAGNOSIS — N1832 Chronic kidney disease, stage 3b: Secondary | ICD-10-CM | POA: Diagnosis not present

## 2023-04-26 DIAGNOSIS — E559 Vitamin D deficiency, unspecified: Secondary | ICD-10-CM

## 2023-04-26 DIAGNOSIS — M545 Low back pain, unspecified: Secondary | ICD-10-CM

## 2023-04-26 DIAGNOSIS — E1165 Type 2 diabetes mellitus with hyperglycemia: Secondary | ICD-10-CM

## 2023-04-26 DIAGNOSIS — G8929 Other chronic pain: Secondary | ICD-10-CM | POA: Diagnosis not present

## 2023-04-26 DIAGNOSIS — M5441 Lumbago with sciatica, right side: Secondary | ICD-10-CM

## 2023-04-26 DIAGNOSIS — M47816 Spondylosis without myelopathy or radiculopathy, lumbar region: Secondary | ICD-10-CM | POA: Diagnosis not present

## 2023-04-26 DIAGNOSIS — Z8781 Personal history of (healed) traumatic fracture: Secondary | ICD-10-CM

## 2023-04-26 DIAGNOSIS — M4856XA Collapsed vertebra, not elsewhere classified, lumbar region, initial encounter for fracture: Secondary | ICD-10-CM | POA: Diagnosis not present

## 2023-04-26 LAB — RENAL FUNCTION PANEL
Albumin: 4.4 g/dL (ref 3.5–5.2)
BUN: 22 mg/dL (ref 6–23)
CO2: 24 mEq/L (ref 19–32)
Calcium: 9.7 mg/dL (ref 8.4–10.5)
Chloride: 103 mEq/L (ref 96–112)
Creatinine, Ser: 1.32 mg/dL — ABNORMAL HIGH (ref 0.40–1.20)
GFR: 41.9 mL/min — ABNORMAL LOW (ref >60.00)
Glucose, Bld: 110 mg/dL — ABNORMAL HIGH (ref 70–99)
Phosphorus: 4.3 mg/dL (ref 2.3–4.6)
Potassium: 4.7 mEq/L (ref 3.5–5.1)
Sodium: 139 mEq/L (ref 135–145)

## 2023-04-26 LAB — VITAMIN D 25 HYDROXY (VIT D DEFICIENCY, FRACTURES): VITD: 28.18 ng/mL — ABNORMAL LOW (ref 30.00–100.00)

## 2023-04-26 LAB — HEMOGLOBIN A1C: Hgb A1c MFr Bld: 6.5 % (ref 4.6–6.5)

## 2023-04-26 MED ORDER — GABAPENTIN 300 MG PO CAPS
300.0000 mg | ORAL_CAPSULE | Freq: Three times a day (TID) | ORAL | 11 refills | Status: DC
Start: 2023-04-26 — End: 2023-05-01

## 2023-04-26 NOTE — Assessment & Plan Note (Addendum)
Opted not to take lyrica She has continue use of gabapentin 300mg  in PM and at hs prn Has some pain relief with above dose  D/c lyrica Encouraged to take gabapentin TID for pain control

## 2023-04-26 NOTE — Assessment & Plan Note (Addendum)
Diet controlled Normal UACr Repeat hgbA1c

## 2023-04-26 NOTE — Patient Instructions (Addendum)
Go to lab Go to [address] for x-ray. Continue use of gabapentin 300mg  every 8hrs and tylenol 500mg  every 8hrs as needed for pain. Get shingrix vaccine at retail pharmacy

## 2023-04-26 NOTE — Assessment & Plan Note (Signed)
Advised to avoid NSAIDs and any other nephrotoxic meds Encouraged to maintain adequate oral hydration. Repeat BMP today

## 2023-04-26 NOTE — Progress Notes (Signed)
Established Patient Visit  Patient: Sandra Curtis   DOB: 04-01-1956   67 y.o. Female  MRN: [id number] Visit Date: 04/26/2023  Subjective:    Chief Complaint  Patient presents with   Diabetes   Hyperlipidemia    Not fasting- Had bo berry sausage biscuit   Chronic Kidney Disease   Diabetes  Hyperlipidemia   Chronic back pain Acute on chronic lower back pain after carrying 19lbs grandchild. Onset 2weeks ago Associated with radiation to right ankle. No change in GU/GI function or sacral paresthesia Some relief with tylenol 650mg  and gabapentin 300mg  in PM.  Due to hx of non traumatic compression fracture, will repeat lumbar spine x-ray. Encouraged to continue use of tylenol 500mg  every 8hrs and gabapentin 300mg  every 8hrs.  Stage 3a chronic kidney disease (HCC) Advised to avoid NSAIDs and any other nephrotoxic meds Encouraged to maintain adequate oral hydration. Repeat BMP today  DM (diabetes mellitus) (HCC) Diet controlled Normal UACr Repeat hgbA1c  DDD (degenerative disc disease), lumbosacral Opted not to take lyrica She has continue use of gabapentin 300mg  in PM and at hs prn Has some pain relief with above dose  D/c lyrica Encouraged to take gabapentin TID for pain control  Reviewed medical, surgical, and social history today  Medications: Outpatient Medications Prior to Visit  Medication Sig   acetaminophen (TYLENOL) 650 MG CR tablet Take 650 mg by mouth every 8 (eight) hours as needed for pain.   albuterol (VENTOLIN HFA) 108 (90 Base) MCG/ACT inhaler INHALE 1 TO 2 PUFFS INTO THE LUNGS EVERY 6 HOURS AS NEEDED   atorvastatin (LIPITOR) 20 MG tablet Take 1 tablet (20 mg total) by mouth 3 (three) times a week.   famotidine (PEPCID) 40 MG tablet Take 1 tablet (40 mg total) by mouth at bedtime.   fluticasone (FLONASE) 50 MCG/ACT nasal spray Place 2 sprays into both nostrils daily.   Nintedanib (OFEV) 100 MG CAPS TAKE 1 CAPSULE BY MOUTH TWICE A   DAY 12 HOURS APART WITH FOOD   omeprazole (PRILOSEC) 40 MG capsule Take 1 capsule (40 mg total) by mouth in the morning.   traZODone (DESYREL) 50 MG tablet Take 50 mg by mouth at bedtime.   [DISCONTINUED] pregabalin (LYRICA) 50 MG capsule Take 1 capsule (50 mg total) by mouth 2 (two) times daily.   No facility-administered medications prior to visit.   Reviewed past medical and social history.   ROS per HPI above  Last metabolic panel Lab Results  Component Value Date   GLUCOSE 91 12/21/2022   NA 139 12/21/2022   K 4.3 12/21/2022   CL 103 12/21/2022   CO2 25 12/21/2022   BUN 16 12/21/2022   CREATININE 1.40 (H) 12/21/2022   GFR 39.14 (L) 12/21/2022   CALCIUM 9.6 12/21/2022   PHOS 4.2 12/21/2022   PROT 7.9 08/17/2022   ALBUMIN 4.3 12/21/2022   BILITOT 0.3 08/17/2022   ALKPHOS 60 08/17/2022   AST 24 08/17/2022   ALT 11 08/17/2022   Last lipids Lab Results  Component Value Date   CHOL 217 (H) 12/21/2022   HDL 56.30 12/21/2022   LDLCALC 134 (H) 12/21/2022   TRIG 133.0 12/21/2022   CHOLHDL 4 12/21/2022   Last hemoglobin A1c Lab Results  Component Value Date   HGBA1C 6.4 12/21/2022      Objective:  BP 120/60 (BP Location: Left Arm, Patient Position: Sitting, Cuff Size: Normal)   Pulse 90  Temp (!) 97.3 F (36.3 C) (Temporal)   Resp 16   Ht 5' (1.524 m)   Wt 145 lb 3.2 oz (65.9 kg)   SpO2 98%   BMI 28.36 kg/m      Physical Exam Vitals and nursing note reviewed.  Cardiovascular:     Rate and Rhythm: Normal rate and regular rhythm.     Pulses: Normal pulses.     Heart sounds: Normal heart sounds.  Pulmonary:     Effort: Pulmonary effort is normal.     Breath sounds: Normal breath sounds.  Neurological:     Mental Status: She is alert and oriented to person, place, and time.     No results found for any visits on 04/26/23.    Assessment & Plan:    Problem List Items Addressed This Visit       Endocrine   DM (diabetes mellitus) (HCC) - Primary     Diet controlled Normal UACr Repeat hgbA1c      Relevant Orders   Hemoglobin A1c   Renal Function Panel     Musculoskeletal and Integument   DDD (degenerative disc disease), lumbosacral    Opted not to take lyrica She has continue use of gabapentin 300mg  in PM and at hs prn Has some pain relief with above dose  D/c lyrica Encouraged to take gabapentin TID for pain control      Relevant Medications   gabapentin (NEURONTIN) 300 MG capsule   Other Relevant Orders   DG Lumbar Spine Complete   Hx of compression fracture of spine   Relevant Medications   gabapentin (NEURONTIN) 300 MG capsule   Other Relevant Orders   DG Lumbar Spine Complete   VITAMIN D 25 Hydroxy (Vit-D Deficiency, Fractures)     Genitourinary   Stage 3a chronic kidney disease (HCC)    Advised to avoid NSAIDs and any other nephrotoxic meds Encouraged to maintain adequate oral hydration. Repeat BMP today        Other   Chronic back pain    Acute on chronic lower back pain after carrying 19lbs grandchild. Onset 2weeks ago Associated with radiation to right ankle. No change in GU/GI function or sacral paresthesia Some relief with tylenol 650mg  and gabapentin 300mg  in PM.  Due to hx of non traumatic compression fracture, will repeat lumbar spine x-ray. Encouraged to continue use of tylenol 500mg  every 8hrs and gabapentin 300mg  every 8hrs.      Relevant Medications   gabapentin (NEURONTIN) 300 MG capsule   Other Relevant Orders   DG Lumbar Spine Complete   Return in about 6 months (around 10/27/2023) for DM, hyperlipidemia (fasting).     Alysia Penna, NP

## 2023-04-26 NOTE — Assessment & Plan Note (Signed)
Acute on chronic lower back pain after carrying 19lbs grandchild. Onset 2weeks ago Associated with radiation to right ankle. No change in GU/GI function or sacral paresthesia Some relief with tylenol 650mg  and gabapentin 300mg  in PM.  Due to hx of non traumatic compression fracture, will repeat lumbar spine x-ray. Encouraged to continue use of tylenol 500mg  every 8hrs and gabapentin 300mg  every 8hrs.

## 2023-04-27 ENCOUNTER — Encounter: Payer: Self-pay | Admitting: Nurse Practitioner

## 2023-04-27 DIAGNOSIS — G8929 Other chronic pain: Secondary | ICD-10-CM

## 2023-04-27 DIAGNOSIS — M5137 Other intervertebral disc degeneration, lumbosacral region: Secondary | ICD-10-CM

## 2023-04-27 DIAGNOSIS — Z8781 Personal history of (healed) traumatic fracture: Secondary | ICD-10-CM

## 2023-04-28 DIAGNOSIS — E559 Vitamin D deficiency, unspecified: Secondary | ICD-10-CM | POA: Insufficient documentation

## 2023-04-28 MED ORDER — VITAMIN D (ERGOCALCIFEROL) 1.25 MG (50000 UNIT) PO CAPS
50000.0000 [IU] | ORAL_CAPSULE | ORAL | 0 refills | Status: DC
Start: 2023-04-28 — End: 2023-08-18

## 2023-04-28 NOTE — Addendum Note (Signed)
Addended by: Alysia Penna L on: 04/28/2023 03:22 PM   Modules accepted: Orders

## 2023-05-01 ENCOUNTER — Telehealth: Payer: Self-pay | Admitting: Nurse Practitioner

## 2023-05-01 DIAGNOSIS — M5137 Other intervertebral disc degeneration, lumbosacral region: Secondary | ICD-10-CM

## 2023-05-01 DIAGNOSIS — Z8781 Personal history of (healed) traumatic fracture: Secondary | ICD-10-CM

## 2023-05-01 DIAGNOSIS — G8929 Other chronic pain: Secondary | ICD-10-CM

## 2023-05-01 MED ORDER — PREGABALIN 50 MG PO CAPS
50.0000 mg | ORAL_CAPSULE | Freq: Two times a day (BID) | ORAL | 3 refills | Status: DC
Start: 2023-05-01 — End: 2023-05-01

## 2023-05-01 NOTE — Telephone Encounter (Signed)
Pt is requesting that her meds go to [facility] on [address]. Her pregabalin (LYRICA) 50 MG capsule [[id number]] was sent to a mail order and she does not use this.

## 2023-05-01 NOTE — Telephone Encounter (Signed)
Please update pharmacy to [facility] on [address].

## 2023-05-02 MED ORDER — PREGABALIN 50 MG PO CAPS
50.0000 mg | ORAL_CAPSULE | Freq: Three times a day (TID) | ORAL | 3 refills | Status: DC
Start: 2023-05-02 — End: 2023-08-18

## 2023-05-02 MED ORDER — PREGABALIN 50 MG PO CAPS
50.0000 mg | ORAL_CAPSULE | Freq: Three times a day (TID) | ORAL | 3 refills | Status: DC
Start: 2023-05-02 — End: 2023-05-02

## 2023-05-02 NOTE — Addendum Note (Signed)
Addended by: Alysia Penna L on: 05/02/2023 04:01 PM   Modules accepted: Orders

## 2023-05-02 NOTE — Telephone Encounter (Signed)
Left patient a detailed voice message regarding updated pharmacy and to return call to office with any concerns.

## 2023-05-12 ENCOUNTER — Other Ambulatory Visit: Payer: Self-pay | Admitting: Gastroenterology

## 2023-05-12 ENCOUNTER — Telehealth: Payer: Self-pay | Admitting: Gastroenterology

## 2023-05-12 MED ORDER — OMEPRAZOLE 40 MG PO CPDR: 40.0000 mg | DELAYED_RELEASE_CAPSULE | Freq: Every morning | ORAL | 1 refills | Status: DC

## 2023-05-12 NOTE — Telephone Encounter (Signed)
Patient called stating that she would like it sent to [facility] on [address]. Patient was scheduled for OV on 10/22 at 2:30

## 2023-05-12 NOTE — Telephone Encounter (Signed)
Inbound call from patient requesting a refill for omeprazole medication. Please advise, thank you.

## 2023-05-12 NOTE — Telephone Encounter (Signed)
Left message for patient to return my call to ask what pharmacy she wants her medication sent to and also let patient know she is due for follow up visit.

## 2023-05-12 NOTE — Telephone Encounter (Signed)
Prescription sent to patient's pharmacy until scheduled appt. 

## 2023-06-27 ENCOUNTER — Other Ambulatory Visit: Payer: Self-pay | Admitting: Nurse Practitioner

## 2023-06-27 DIAGNOSIS — E1169 Type 2 diabetes mellitus with other specified complication: Secondary | ICD-10-CM

## 2023-06-27 DIAGNOSIS — I700 Atherosclerosis of aorta: Secondary | ICD-10-CM

## 2023-07-03 ENCOUNTER — Inpatient Hospital Stay: Admission: RE | Admit: 2023-07-03 | Payer: No Typology Code available for payment source | Source: Ambulatory Visit

## 2023-07-04 ENCOUNTER — Telehealth: Payer: Self-pay | Admitting: Physician Assistant

## 2023-07-04 ENCOUNTER — Other Ambulatory Visit: Payer: Self-pay | Admitting: Physician Assistant

## 2023-07-04 ENCOUNTER — Inpatient Hospital Stay
Admission: RE | Admit: 2023-07-04 | Discharge: 2023-07-04 | Disposition: A | Discharge status: Home / Self Care | Payer: No Typology Code available for payment source | Source: Ambulatory Visit | Attending: Orthopaedic Surgery | Admitting: Orthopaedic Surgery

## 2023-07-04 DIAGNOSIS — E349 Endocrine disorder, unspecified: Secondary | ICD-10-CM | POA: Diagnosis not present

## 2023-07-04 DIAGNOSIS — M810 Age-related osteoporosis without current pathological fracture: Secondary | ICD-10-CM

## 2023-07-04 DIAGNOSIS — Z8781 Personal history of (healed) traumatic fracture: Secondary | ICD-10-CM

## 2023-07-04 DIAGNOSIS — N958 Other specified menopausal and perimenopausal disorders: Secondary | ICD-10-CM | POA: Diagnosis not present

## 2023-07-04 DIAGNOSIS — M8588 Other specified disorders of bone density and structure, other site: Secondary | ICD-10-CM | POA: Diagnosis not present

## 2023-07-04 DIAGNOSIS — G8929 Other chronic pain: Secondary | ICD-10-CM

## 2023-07-04 NOTE — Telephone Encounter (Signed)
Pt states she returning call to [facility]. Please call pt at [phone].

## 2023-07-06 ENCOUNTER — Telehealth: Payer: Self-pay | Admitting: Physician Assistant

## 2023-07-06 NOTE — Telephone Encounter (Signed)
Patient returned call asked for a call back concerning her test results. The number to contact patient is [phone]

## 2023-07-06 NOTE — Telephone Encounter (Signed)
Pt is requesting a call in regards to test results.

## 2023-07-24 ENCOUNTER — Ambulatory Visit: Payer: No Typology Code available for payment source | Admitting: Physician Assistant

## 2023-07-25 DIAGNOSIS — Z008 Encounter for other general examination: Secondary | ICD-10-CM | POA: Diagnosis not present

## 2023-07-25 DIAGNOSIS — E663 Overweight: Secondary | ICD-10-CM | POA: Diagnosis not present

## 2023-07-25 DIAGNOSIS — M810 Age-related osteoporosis without current pathological fracture: Secondary | ICD-10-CM | POA: Diagnosis not present

## 2023-07-25 DIAGNOSIS — Z6827 Body mass index (BMI) 27.0-27.9, adult: Secondary | ICD-10-CM | POA: Diagnosis not present

## 2023-07-25 DIAGNOSIS — F32A Depression, unspecified: Secondary | ICD-10-CM | POA: Diagnosis not present

## 2023-07-25 DIAGNOSIS — F17211 Nicotine dependence, cigarettes, in remission: Secondary | ICD-10-CM | POA: Diagnosis not present

## 2023-08-01 ENCOUNTER — Encounter: Payer: Self-pay | Admitting: Gastroenterology

## 2023-08-01 ENCOUNTER — Ambulatory Visit (INDEPENDENT_AMBULATORY_CARE_PROVIDER_SITE_OTHER): Payer: No Typology Code available for payment source | Admitting: Gastroenterology

## 2023-08-01 VITALS — BP 112/58 | HR 79 | Ht 60.0 in | Wt 144.4 lb

## 2023-08-01 DIAGNOSIS — R1310 Dysphagia, unspecified: Secondary | ICD-10-CM

## 2023-08-01 DIAGNOSIS — K2100 Gastro-esophageal reflux disease with esophagitis, without bleeding: Secondary | ICD-10-CM | POA: Diagnosis not present

## 2023-08-01 DIAGNOSIS — Z1211 Encounter for screening for malignant neoplasm of colon: Secondary | ICD-10-CM

## 2023-08-01 DIAGNOSIS — R1319 Other dysphagia: Secondary | ICD-10-CM

## 2023-08-01 DIAGNOSIS — K449 Diaphragmatic hernia without obstruction or gangrene: Secondary | ICD-10-CM

## 2023-08-01 MED ORDER — OMEPRAZOLE 40 MG PO CPDR: 40.0000 mg | DELAYED_RELEASE_CAPSULE | Freq: Two times a day (BID) | ORAL | 0 refills | Status: DC

## 2023-08-01 NOTE — Patient Instructions (Signed)
We have sent the following medications to your pharmacy for you to pick up at your convenience: omeprazole 40 mg twice daily before breakfast and dinner.   Continue famotidine at bedtime.   You have been scheduled for an endoscopy. Please follow written instructions given to you at your visit today.  If you use inhalers (even only as needed), please bring them with you on the day of your procedure.  If you take any of the following medications, they will need to be adjusted prior to your procedure:   DO NOT TAKE 7 DAYS PRIOR TO TEST- Trulicity (dulaglutide) Ozempic, Wegovy (semaglutide) Mounjaro (tirzepatide) Bydureon Bcise (exanatide extended release)  DO NOT TAKE 1 DAY PRIOR TO YOUR TEST Rybelsus (semaglutide) Adlyxin (lixisenatide) Victoza (liraglutide) Byetta (exanatide) _______________________________________________________________________  The [facility] providers would like to encourage you to use [facility] to communicate with providers for non-urgent requests or questions.  Due to long hold times on the telephone, sending your provider a message by [facility] may be a faster and more efficient way to get a response.  Please allow 48 business hours for a response.  Please remember that this is for non-urgent requests.   Due to recent changes in healthcare laws, you may see the results of your imaging and laboratory studies on MyChart before your provider has had a chance to review them.  We understand that in some cases there may be results that are confusing or concerning to you. Not all laboratory results come back in the same time frame and the provider may be waiting for multiple results in order to interpret others.  Please give [place] 48 hours in order for your provider to thoroughly review all the results before contacting the office for clarification of your results.   Thank you for choosing me and [facility].  Venita Lick. Pleas Koch., MD., Clementeen Graham

## 2023-08-01 NOTE — Progress Notes (Signed)
    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 The risks (including bleeding, perforation, infection, missed lesions, medication reactions and possible hospitalization or surgery if complications occur), benefits, and alternatives to endoscopy with possible biopsy and possible dilation were discussed with the patient and they consent to proceed.   Screening colonoscopy recommended in March 2025   HPI    This is a 67 year old female with frequent breakthrough reflux symptoms and recurrent solid food dysphagia.  She relates resolution of her dysphagia symptoms following EGD with dilation last year.  She notes frequent breakthrough symptoms occurring often in the afternoons and occasionally in the evenings.  She has episodes of solid food dysphagia and induces vomiting to clear the transient impaction. July 2024 echo shows EF=60=65%  EGD Sept 2023 - Benign-appearing esophageal stenosis. Dilated.  - Medium-sized hiatal hernia.  - Normal duodenal bulb and second portion of the duodenum.  - No specimens collected.   Labs / Imaging       Latest Ref Rng & Units 04/26/2023   11:18 AM 12/21/2022    1:34 PM 08/17/2022    3:37 PM  Hepatic Function  Total Protein 6.0 - 8.3 g/dL   7.9   Albumin 3.5 - 5.2 g/dL 4.4  4.3  4.6   AST 0 - 37 U/L   24   ALT 0 - 35 U/L   11   Alk Phosphatase 39 - 117 U/L   60   Total Bilirubin 0.2 - 1.2 mg/dL   0.3        Latest Ref Rng & Units 08/24/2022   11:04 AM 08/27/2021    2:35 PM 11/17/2020   10:25 AM  CBC  WBC 4.0 - 10.5 K/uL 6.7  6.8  5.9   Hemoglobin 12.0 - 15.0 g/dL 40.9  81.1  91.4   Hematocrit 36.0 - 46.0 % 30.6  32.3  32.1   Platelets 150.0 - 400.0 K/uL 223.0  251.0  240.0      Current Medications, Allergies, Past Medical History, Past Surgical History, Family History and Social History were reviewed in [employer] record.   Physical Exam: General: Well developed, well nourished, no acute distress Head: Normocephalic and atraumatic Eyes: Sclerae anicteric, EOMI Ears: Normal auditory acuity Mouth: No deformities or lesions noted Lungs: Clear throughout to auscultation Heart: Regular rate and rhythm; No murmurs, rubs or bruits Abdomen: Soft, non tender and non distended. No masses, hepatosplenomegaly or hernias noted. Normal Bowel sounds Rectal: Not done Musculoskeletal: Symmetrical with no gross deformities  Pulses:  Normal pulses noted Extremities: No edema or deformities noted Neurological: Alert oriented x 4, grossly nonfocal Psychological:  Alert and cooperative. Normal mood and affect   Onya Eutsler T. Russella Dar, MD 08/01/2023, 2:38 PM

## 2023-08-02 ENCOUNTER — Ambulatory Visit
Admission: RE | Admit: 2023-08-02 | Discharge: 2023-08-02 | Disposition: A | Discharge status: Home / Self Care | Payer: No Typology Code available for payment source | Source: Ambulatory Visit | Attending: Pulmonary Disease

## 2023-08-02 DIAGNOSIS — J849 Interstitial pulmonary disease, unspecified: Secondary | ICD-10-CM

## 2023-08-02 DIAGNOSIS — J8410 Pulmonary fibrosis, unspecified: Secondary | ICD-10-CM | POA: Diagnosis not present

## 2023-08-02 DIAGNOSIS — R079 Chest pain, unspecified: Secondary | ICD-10-CM | POA: Diagnosis not present

## 2023-08-02 DIAGNOSIS — J439 Emphysema, unspecified: Secondary | ICD-10-CM | POA: Diagnosis not present

## 2023-08-02 DIAGNOSIS — R0602 Shortness of breath: Secondary | ICD-10-CM | POA: Diagnosis not present

## 2023-08-07 ENCOUNTER — Encounter: Payer: Self-pay | Admitting: Pulmonary Disease

## 2023-08-07 ENCOUNTER — Ambulatory Visit (INDEPENDENT_AMBULATORY_CARE_PROVIDER_SITE_OTHER): Payer: No Typology Code available for payment source | Admitting: Pulmonary Disease

## 2023-08-07 VITALS — BP 118/68 | HR 91 | Ht 60.0 in | Wt 143.0 lb

## 2023-08-07 DIAGNOSIS — J849 Interstitial pulmonary disease, unspecified: Secondary | ICD-10-CM

## 2023-08-07 DIAGNOSIS — J84112 Idiopathic pulmonary fibrosis: Secondary | ICD-10-CM | POA: Diagnosis not present

## 2023-08-07 DIAGNOSIS — Z5181 Encounter for therapeutic drug level monitoring: Secondary | ICD-10-CM

## 2023-08-07 NOTE — Progress Notes (Signed)
Sandra Curtis    [id number]    Aug 26, 1956  Primary Care Physician:Nche, Bonna Gains, NP  Referring Physician: [name], NP [address] [place],  [place] [address]  Chief complaint: Follow-up for IPF Did not tolerate [name] April 2022  HPI: 67 y.o.  with history of allergies, GERD, esophageal stricture Complains of chronic cough for several years, worsening dyspnea on exertion. She had a CT scan done by Dr. Elease Etienne, primary care showing pulmonary fibrosis and has been referred for further evaluation.  Followed by Dr. Russella Dar, GI with EGD in 2022 showing esophageal stricture secondary to acid reflux.   Has also seen Dr. Dimple Casey, rheumatology for evaluation of elevated ANA and SSA.  He feels she does not have any signs of autoimmune disease.  Discussed at multidisciplinary conference in December 2021 with diagnosis of IPF Started Esbriet in December 2021.  But did not tolerate even at a low dose due to dizziness and headache. Antifibrotic switched to Ofev in April 2022 She is tolerating this better  She has a new diagnosis of CKD and is now followed by Dr. 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  No mold, hot tub, Jacuzzi.  No feather pillows or comforters Smoking history: 18-pack-year smoker.  Quit 1 month ago Travel history: No significant travel history Relevant family history: No significant family issue of lung disease  Interval history: Discussed the use of AI scribe software for clinical note transcription with the patient, who gave verbal consent to proceed.  The patient, with a history of idiopathic pulmonary fibrosis (IPF), presents with ongoing shortness of breath, which is particularly noticeable when walking. She reports feeling 'out of breath quick' and had a  coughing episode prior to the appointment. She has been managing her IPF with Ofev (nintedanib) 100mg  twice daily, but she ran out of medication in July and has not been able to refill her prescription due to issues with the pharmacy. She reports that her breathing has improved since she stopped working and is no longer exposed to dust and chemicals. She also notes that she can now lie on her right side without coughing and choking, which was a problem for her in the past.  In addition to her IPF, the patient has been diagnosed with gastroesophageal reflux disease (GERD), esophagitis, and stricture. She is scheduled for an endoscopy next month to further evaluate these conditions.   Outpatient Encounter Medications as of 08/07/2023  Medication Sig   acetaminophen (TYLENOL) 650 MG CR tablet Take 650 mg by mouth every 8 (eight) hours as needed for pain.   albuterol (VENTOLIN HFA) 108 (90 Base) MCG/ACT inhaler INHALE 1 TO 2 PUFFS INTO THE LUNGS EVERY 6 HOURS AS NEEDED   atorvastatin (LIPITOR) 20 MG tablet Take 1 tablet (20 mg total) by mouth 3 (three) times a week.   famotidine (PEPCID) 40 MG tablet TAKE 1 TABLET(40 MG) BY MOUTH AT BEDTIME   fluticasone (FLONASE) 50 MCG/ACT nasal spray Place 2 sprays into both nostrils daily.   Nintedanib (OFEV) 100 MG CAPS TAKE 1 CAPSULE BY MOUTH TWICE A  DAY 12 HOURS APART WITH FOOD   omeprazole (PRILOSEC) 40 MG capsule Take 1 capsule (40 mg total) by mouth in the morning and at bedtime.   pregabalin (LYRICA) 50 MG capsule Take 1 capsule (  50 mg total) by mouth 3 (three) times daily.   Vitamin D, Ergocalciferol, (DRISDOL) 1.25 MG (50000 UNIT) CAPS capsule Take 1 capsule (50,000 Units total) by mouth every 7 (seven) days.   No facility-administered encounter medications on file as of 08/07/2023.   Physical Exam: Blood pressure 118/68, pulse 91, height 5' (1.524 m), weight 143 lb (64.9 kg), SpO2 94%. Gen:      No acute distress HEENT:  EOMI, sclera anicteric Neck:      No masses; no thyromegaly Lungs:    Clear to auscultation bilaterally; normal respiratory effort CV:         Regular rate and rhythm; no murmurs Abd:      + bowel sounds; soft, non-tender; no palpable masses, no distension Ext:    No edema; adequate peripheral perfusion Skin:      Warm and dry; no rash Neuro: alert and oriented x 3 Psych: normal mood and affect   Data Reviewed: Imaging: Chest x-ray 05/15/2020-diffuse interstitial opacities CT chest 06/17/2020-reticulation with traction bronchiectasis, honeycombing most prominent in the right lung base.  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 High-resolution CT 11/02/2021-stable findings of UIP pattern pulmonary fibrosis High resolution CT 07/16/2022-stable findings of UIP pattern pulmonary fibrosis. High resolution CT 08/02/2023-radiology read is pending.  By my read UIP pattern of pulmonary fibrosis appears stable. I have reviewed the images personally.  PFTs: 09/09/2020 FVC 1.80 [85%], FEV1 1.55 [94%], F/F 86, TLC 3.05 [60%], DLCO 10.61 [61%] Mild-moderate restriction and diffusion defect  08/17/2022 FVC 1.74 [65%], FEV1 1.50 [74%], F/F 86, TLC 3.70 [83%], DLCO 9.83 [57%]  Labs: ANA 06/27/2019 1:40, cytoplasmic, rheumatoid factor less than 14 ANA 07/16/2020-1:40, SSA greater than 8  CMP 12/28/21- WNL  Cardiac: Echocardiogram 04/17/2023-LVEF 60 to 65%, grade 1 diastolic dysfunction, normal RV systolic size and function.  PA pressure is normal.  Assessment:  IPF CT with UIP pattern fibrosis. She does have mild arthritis symptoms which may be secondary to osteoarthritis but does endorse some morning stiffness and small joint pain.  She had some occupational exposures to textile fiber and dust but no asbestos. Her exposures are not a cause of the pulmonary finrosis No evidence of autoimmune process, connective tissue disease per rheumatology evaluation.  Case discussed at multidisciplinary conference in dec 2021 and diagnosis felt to be consistent  with IPF Unfortunately she has not tolerated Esbriet even at a low dose of 1 tablet 3 times a day and is now on Ofev at 100 mg twice daily. Finished pulm rehab.  No evidence of pulmonary hypertension on echocardiogram We discussed referral to lung transplantation but she would like to hold off for now  CT scan shows stable disease. However, patient has been off Ofev since July due to issues with pharmacy refill. -Contact pharmacy to resolve issue and restart Ofev 100mg  BID. -Schedule lung function test in 6 months.   GERD and Esophagitis Patient scheduled for EGD with Dr. Russella Dar on 08/24/2023. -Continue current management plan.  CKD Now followed by nephrology Get labs from them for review  Depression She is still struggling with her diagnosis with feelings of depression I have offered referral to psychiatry but she wants to hold off for now Have encouraged her to follow-up with primary care  RSV Vaccination Patient received RSV shot on 08/09/2023 and experienced side effects for two days. -No further action required at this time.  She does not want flu vaccination.  Plan/Recommendations: Check with pharmacy about reinitiation of Ofev Labs for monitoring Order follow-up PFTs  in 6 months Tessalon for cough  Chilton Greathouse MD [facility] 08/07/2023, 9:47 AM  CC: Nche, Bonna Gains, NP

## 2023-08-07 NOTE — Patient Instructions (Signed)
VISIT SUMMARY:  During today's visit, we discussed your ongoing shortness of breath related to your idiopathic pulmonary fibrosis (IPF) and your recent issues with medication refills. We also reviewed your upcoming endoscopy for gastroesophageal reflux disease (GERD) and esophagitis, and noted your recent RSV vaccination.  YOUR PLAN:  -IDIOPATHIC PULMONARY FIBROSIS (IPF): Idiopathic Pulmonary Fibrosis (IPF) is a lung disease that causes scarring of the lungs, leading to breathing difficulties. You have been experiencing shortness of breath and have not been taking your medication, Ofev, since July due to pharmacy issues. We will contact the pharmacy to resolve this and restart your Ofev 100mg  twice daily. Additionally, we will schedule a lung function test in 6 months to monitor your condition.  -GERD AND ESOPHAGITIS: Gastroesophageal Reflux Disease (GERD) and esophagitis involve the backflow of stomach acid into the esophagus, causing irritation and inflammation. You are scheduled for an endoscopy with Dr. Russella Dar on 08/24/2023 to further evaluate these conditions. Please continue with your current management plan until then.  -RSV VACCINATION: You received the RSV vaccination on 08/09/2023 and experienced side effects for two days. No further action is required at this time.  INSTRUCTIONS:  Please contact the pharmacy to resolve the issue with your Ofev prescription and restart taking it as directed. Your lung function test is scheduled for 6 months from now. Continue with your current management plan for GERD and esophagitis, and attend your endoscopy appointment with Dr. Russella Dar on 08/24/2023.

## 2023-08-10 ENCOUNTER — Other Ambulatory Visit: Payer: Self-pay | Admitting: Gastroenterology

## 2023-08-11 ENCOUNTER — Telehealth: Payer: Self-pay | Admitting: Pharmacy Technician

## 2023-08-11 DIAGNOSIS — J84112 Idiopathic pulmonary fibrosis: Secondary | ICD-10-CM

## 2023-08-11 NOTE — Telephone Encounter (Signed)
-----   Message from [facility] sent at 08/07/2023 10:12 AM EDT ----- The pharmacy stopped sending her Ofev in July 2024.  Can we please check and see what the issue is.  Thank you.

## 2023-08-14 ENCOUNTER — Encounter: Payer: Self-pay | Admitting: Physician Assistant

## 2023-08-14 ENCOUNTER — Ambulatory Visit (INDEPENDENT_AMBULATORY_CARE_PROVIDER_SITE_OTHER): Payer: No Typology Code available for payment source | Admitting: Physician Assistant

## 2023-08-14 VITALS — BP 120/66 | HR 97 | Ht 60.0 in | Wt 145.0 lb

## 2023-08-14 DIAGNOSIS — Z8781 Personal history of (healed) traumatic fracture: Secondary | ICD-10-CM

## 2023-08-14 DIAGNOSIS — M8000XA Age-related osteoporosis with current pathological fracture, unspecified site, initial encounter for fracture: Secondary | ICD-10-CM | POA: Insufficient documentation

## 2023-08-14 NOTE — Progress Notes (Signed)
Office Visit Note   Patient: Sandra Curtis           Date of Birth: 06/22/1956           MRN: [id number] Visit Date: 08/14/2023              Requested by: Isael Stille, [place], [place] [address] [place],  [place] [address] PCP: [name], NP   Assessment & Plan: Visit Diagnoses:  1. Hx of compression fracture of spine   2. Age-related osteoporosis with current pathological fracture, initial encounter     Plan: Patient is a pleasant 67 year old woman who is referred by Dr. Ophelia Charter.  She is referred for evaluation of osteoporosis.  She does have a history of a spinal compression fracture in 2021.  She sees Dr. Ophelia Charter for degenerative back disease.  She does not currently take any medication for osteoporosis again she had a vertebral fracture compression fracture and has lost 2 inches in height.  She does not have a history of stroke cancer.  She does have some slight elevated kidney function.  She does have a history of ulcer and severe reflux.  No history of epilepsy.  She takes calcium 1 daily is unsure of the dose and gets her vitamin D and a multivitamin.  She has never been on hormone replacement therapy went through menopause at age 23 she is a former smoker.  She does not drink and for exercise she does only stretching.  She does have significant lung disease and has been on chronic steroids.  Her most recent DEXA scan was a T-score of -3.3.  Calculating her FRAX that puts her in a 24% risk of major osteoporotic fracture and a 5.7 risk of hip fracture.  I explained all this to her that she is at a high risk of fracture.  We discussed options she is going to contact her primary care because she really would benefit from some strength training.  Her calcium and vitamin D seem optimized by most recent labs.  She would not be a good candidate for bicep biphosphonate's because of her severe reflux and history of ulcer disease.  She also has some questionable kidney elevation findings  in her labs.  Would recommend either [facility] followed by [facility] or [facility]ia itself.  I have given her information on the all this she will contact me if she wishes to continue  Follow-Up Instructions: Return if symptoms worsen or fail to improve.   Orders:  No orders of the defined types were placed in this encounter.  No orders of the defined types were placed in this encounter.     Procedures: No procedures performed   Clinical Data: No additional findings.   Subjective: Chief Complaint  Patient presents with   Lower Back - Pain    Patent has some back pain and was ref by Dr Ophelia Charter for Osteo    HPI patient is a pleasant 67 year old woman referred from Dr. Ophelia Charter has a history of compression fracture of the vertebral spine.  She has a BMI of 23.4.  She also has had a 2 inch loss of height.  Review of Systems  All other systems reviewed and are negative.    Objective: Vital Signs: BP 120/66   Pulse 97   Ht 5' (1.524 m)   Wt 145 lb (65.8 kg)   BMI 28.32 kg/m   Physical Exam Constitutional:      Appearance: Normal appearance.  Pulmonary:  Effort: Pulmonary effort is normal.  Neurological:     General: No focal deficit present.     Mental Status: She is alert and oriented to person, place, and time.  Psychiatric:        Mood and Affect: Mood normal.        Behavior: Behavior normal.      Specialty Comments:  No specialty comments available.  Imaging: No results found.   PMFS History: Patient Active Problem List   Diagnosis Date Noted   Age-related osteoporosis with current pathological fracture 08/14/2023   Vitamin D deficiency 04/28/2023   Hx of compression fracture of spine 12/21/2022   Stage 3a chronic kidney disease (HCC) 12/21/2022   Hyperlipidemia associated with type 2 diabetes mellitus (HCC) 12/21/2022   Atherosclerosis of aorta (HCC) 01/12/2021   Primary insomnia 01/12/2021   Positive ANA (antinuclear antibody) 09/09/2020   IPF  (idiopathic pulmonary fibrosis) (HCC) 06/26/2020   Chronic cough 05/13/2020   Atrophic vaginitis 01/09/2019   Menopausal flushing 01/09/2019   Colon polyp, hyperplastic 01/09/2019   DM (diabetes mellitus) (HCC) 12/11/2017   Normocytic anemia 12/11/2017   Depressive disorder 02/06/2017   Gastroesophageal reflux disease with esophagitis 02/06/2017   DDD (degenerative disc disease), lumbosacral 02/06/2017   Dysphagia 02/06/2017   Scoliosis of lumbar spine 02/06/2017   Chronic back pain 02/06/2017   Aortic regurgitation 10/02/2013   Headache 03/01/2012   Seasonal allergic rhinitis 03/01/2012   Past Medical History:  Diagnosis Date   Allergic rhinitis    Allergy    Chronic back pain 07/20/2018   Chronic midline low back pain 02/06/2017   Depression    Esophageal stricture    GERD (gastroesophageal reflux disease)    Inflammatory polyps of colon (HCC) 2010   Pulmonary filariasis (HCC)    Systolic murmur    UTI (urinary tract infection)     Family History  Problem Relation Age of Onset   Arthritis Mother    Thyroid disease Mother    Heart disease Father    Hypertension Father    Diabetes Father    Arthritis Sister    Diabetes Brother    Colon cancer Neg Hx    Stomach cancer Neg Hx    Rectal cancer Neg Hx    Esophageal cancer Neg Hx     Past Surgical History:  Procedure Laterality Date   BREAST BIOPSY Left 01/22/2021   COLONOSCOPY     UPPER GASTROINTESTINAL ENDOSCOPY     Social History   Occupational History   Occupation: Biomedical engineer: paperworks  Tobacco Use   Smoking status: Former    Current packs/day: 0.00    Average packs/day: 0.5 packs/day for 35.0 years (17.5 ttl pk-yrs)    Types: Cigarettes    Start date: 04/13/1983    Quit date: 04/12/2018    Years since quitting: 5.3    Passive exposure: Never   Smokeless tobacco: Never  Vaping Use   Vaping status: Never Used  Substance and Sexual Activity   Alcohol use: Not Currently   Drug use: No    Sexual activity: Not Currently

## 2023-08-16 DIAGNOSIS — J8410 Pulmonary fibrosis, unspecified: Secondary | ICD-10-CM | POA: Diagnosis not present

## 2023-08-16 DIAGNOSIS — N183 Chronic kidney disease, stage 3 unspecified: Secondary | ICD-10-CM | POA: Diagnosis not present

## 2023-08-16 DIAGNOSIS — E1122 Type 2 diabetes mellitus with diabetic chronic kidney disease: Secondary | ICD-10-CM | POA: Diagnosis not present

## 2023-08-16 DIAGNOSIS — R768 Other specified abnormal immunological findings in serum: Secondary | ICD-10-CM | POA: Diagnosis not present

## 2023-08-16 DIAGNOSIS — N2581 Secondary hyperparathyroidism of renal origin: Secondary | ICD-10-CM | POA: Diagnosis not present

## 2023-08-16 DIAGNOSIS — D631 Anemia in chronic kidney disease: Secondary | ICD-10-CM | POA: Diagnosis not present

## 2023-08-17 NOTE — Telephone Encounter (Signed)
Patient is calling for a refill of Ofev.   Pharmacy fax number [phone]

## 2023-08-18 ENCOUNTER — Encounter: Payer: Self-pay | Admitting: Nurse Practitioner

## 2023-08-18 ENCOUNTER — Telehealth (INDEPENDENT_AMBULATORY_CARE_PROVIDER_SITE_OTHER): Payer: No Typology Code available for payment source | Admitting: Nurse Practitioner

## 2023-08-18 DIAGNOSIS — D649 Anemia, unspecified: Secondary | ICD-10-CM

## 2023-08-18 DIAGNOSIS — E785 Hyperlipidemia, unspecified: Secondary | ICD-10-CM

## 2023-08-18 DIAGNOSIS — M5442 Lumbago with sciatica, left side: Secondary | ICD-10-CM | POA: Diagnosis not present

## 2023-08-18 DIAGNOSIS — E1169 Type 2 diabetes mellitus with other specified complication: Secondary | ICD-10-CM | POA: Diagnosis not present

## 2023-08-18 DIAGNOSIS — M51372 Other intervertebral disc degeneration, lumbosacral region with discogenic back pain and lower extremity pain: Secondary | ICD-10-CM

## 2023-08-18 DIAGNOSIS — M8000XD Age-related osteoporosis with current pathological fracture, unspecified site, subsequent encounter for fracture with routine healing: Secondary | ICD-10-CM

## 2023-08-18 DIAGNOSIS — G8929 Other chronic pain: Secondary | ICD-10-CM

## 2023-08-18 DIAGNOSIS — Z8781 Personal history of (healed) traumatic fracture: Secondary | ICD-10-CM

## 2023-08-18 DIAGNOSIS — E559 Vitamin D deficiency, unspecified: Secondary | ICD-10-CM

## 2023-08-18 MED ORDER — VITAMIN D (ERGOCALCIFEROL) 1.25 MG (50000 UNIT) PO CAPS: 50000.0000 [IU] | ORAL_CAPSULE | ORAL | 0 refills | Status: DC

## 2023-08-18 MED ORDER — EVENITY 105 MG/1.17ML ~~LOC~~ SOSY: 210.0000 mg | PREFILLED_SYRINGE | SUBCUTANEOUS | 11 refills | Status: DC

## 2023-08-18 MED ORDER — OFEV 100 MG PO CAPS: ORAL_CAPSULE | ORAL | Status: DC

## 2023-08-18 MED ORDER — PREGABALIN 50 MG PO CAPS: 50.0000 mg | ORAL_CAPSULE | Freq: Two times a day (BID) | ORAL | 1 refills | Status: DC

## 2023-08-18 NOTE — Assessment & Plan Note (Signed)
Repeat cbc and iron panel

## 2023-08-18 NOTE — Progress Notes (Signed)
Virtual Visit via Video Note  I connected withNAME@ on 08/18/23 at  1:40 PM EST by a video enabled telemedicine application and verified that I am speaking with the correct person using two identifiers.  Location: Patient:Home Provider: Office Participants: patient and provider  I discussed the limitations of evaluation and management by telemedicine and the availability of in person appointments. I also discussed with the patient that there may be a patient responsible charge related to this service. The patient expressed understanding and agreed to proceed.  UJ:[id number], Back pain, DIABETES f/up  History of Present Illness:  Vitamin D deficiency Did not take high dose vit.D as prescribed Resent rx. Advised to switch to OVER THE COUNTER vit D3 1000IU daily after completion of high dose.  Normocytic anemia Repeat cbc and iron panel  Chronic back pain Pain controlled with lyrica 50mg  BID. Reports daytime somnolence if she takes med TID. Reports persistent intermittent mid back pain with left leg radiculopathy, worse with ambulation and supine position. Hx of compression fracture. Repeat lumbar spine x-ray 04/2023: Negative for pars defect. Minimal anterolisthesis of L4 on L5. Mild vertebral body height loss involving the L1 vertebral body is unchanged and chronic. Prominent disc and degenerative endplate changes at T12-L1 are stable. Stable mild disc space narrowing and endplate changes at L5-S1 are stable. Hx of osteoporosis: Evenity prescribed. Also takes OVER THE COUNTER calcium and vit. D3  Entered referral to spine specialist.  Age-related osteoporosis with current pathological fracture Dexa scan 06/2023: The BMD measured at Forearm Radius 33% is 0.592 g/cm2 with a T-scoreof -3.3. This patient is considered osteoporotic according to World Health Organization [facility]) criteria. We discussed use of prolia vs Evenity, and their possible side effects. No hx of MI or CVA. Evenity  monthly injections prescribed.  Advised to maintain daily dose of OVER THE COUNTER calcium and vit. D3 supplement.  Observations/Objective: Alert and oriented x 4  Assessment and Plan: Constantine was seen today for video visit .  Diagnoses and all orders for this visit:  Age-related osteoporosis with current pathological fracture with routine healing, subsequent encounter -     Romosozumab-aqqg (EVENITY) 105 MG/1. SOSY injection; Inject 210 mg into the skin every 30 (thirty) days.  Vitamin D deficiency -     Vitamin D, Ergocalciferol, (DRISDOL) 1.25 MG (50000 UNIT) CAPS capsule; Take 1 capsule (50,000 Units total) by mouth every 7 (seven) days.  Chronic midline low back pain with left-sided sciatica -     Ambulatory referral to Neurosurgery -     pregabalin (LYRICA) 50 MG capsule; Take 1 capsule (50 mg total) by mouth 2 (two) times daily.  Degeneration of intervertebral disc of lumbosacral region with discogenic back pain and lower extremity pain -     Ambulatory referral to Neurosurgery -     pregabalin (LYRICA) 50 MG capsule; Take 1 capsule (50 mg total) by mouth 2 (two) times daily.  Hx of compression fracture of spine -     Ambulatory referral to Neurosurgery -     pregabalin (LYRICA) 50 MG capsule; Take 1 capsule (50 mg total) by mouth 2 (two) times daily.  Type 2 diabetes mellitus with other specified complication, without long-term current use of insulin (HCC) -     Hemoglobin A1c; Future -     Microalbumin / creatinine urine ratio; Future  Hyperlipidemia associated with type 2 diabetes mellitus (HCC) -     Lipid panel; Future -     Hepatic function panel; Future  Normocytic anemia -  CBC with Differential/Platelet; Future -     IBC + Ferritin; Future   Follow Up Instructions: See instructions above   I discussed the assessment and treatment plan with the patient. The patient was provided an opportunity to ask questions and all were answered. The patient  agreed with the plan and demonstrated an understanding of the instructions.   The patient was advised to call back or seek an in-person evaluation if the symptoms worsen or if the condition fails to improve as anticipated.  Alysia Penna, NP

## 2023-08-18 NOTE — Telephone Encounter (Signed)
Received notification from  [employer]  regarding a prior authorization for OFEV. Authorization has been APPROVED from 05/20/2023 to 08/17/2024. Approval letter sent to scan center.  Approval letter sent to media tab for submission once PAP is returned from MD and patient  Authorization # [id number]  Chesley Mires, PharmD, MPH, BCPS, CPP Clinical Pharmacist (Rheumatology and Pulmonology)

## 2023-08-18 NOTE — Assessment & Plan Note (Signed)
Pain controlled with lyrica 50mg  BID. Reports daytime somnolence if she takes med TID. Reports persistent intermittent mid back pain with left leg radiculopathy, worse with ambulation and supine position. Hx of compression fracture. Repeat lumbar spine x-ray 04/2023: Negative for pars defect. Minimal anterolisthesis of L4 on L5. Mild vertebral body height loss involving the L1 vertebral body is unchanged and chronic. Prominent disc and degenerative endplate changes at T12-L1 are stable. Stable mild disc space narrowing and endplate changes at L5-S1 are stable. Hx of osteoporosis: Evenity prescribed. Also takes OVER THE COUNTER calcium and vit. D3  Entered referral to spine specialist.

## 2023-08-18 NOTE — Telephone Encounter (Signed)
I sent prescription to [facility] but I am unable to find any active enrollment into [employer] for Ofev or notes in chart  Will need to reapply for PAP anyways for 2025. Will need to mail application to patient's home and place MD portion in Dr. Shirlee More mailbox  Submitted a Prior Authorization request to  [facility]  for OFEV via CoverMyMeds. Will update once we receive a response.  Key: [id number]   Chesley Mires, PharmD, MPH, BCPS, CPP Clinical Pharmacist (Rheumatology and Pulmonology)

## 2023-08-18 NOTE — Assessment & Plan Note (Signed)
Dexa scan 06/2023: The BMD measured at Forearm Radius 33% is 0.592 g/cm2 with a T-scoreof -3.3. This patient is considered osteoporotic according to World Health Organization [facility]) criteria. We discussed use of prolia vs Evenity, and their possible side effects. No hx of MI or CVA. Evenity monthly injections prescribed.  Advised to maintain daily dose of OVER THE COUNTER calcium and vit. D3 supplement.

## 2023-08-18 NOTE — Assessment & Plan Note (Signed)
Did not take high dose vit.D as prescribed Resent rx. Advised to switch to OVER THE COUNTER vit D3 1000IU daily after completion of high dose.

## 2023-08-21 ENCOUNTER — Ambulatory Visit: Payer: No Typology Code available for payment source | Admitting: Nurse Practitioner

## 2023-08-21 ENCOUNTER — Other Ambulatory Visit: Payer: No Typology Code available for payment source

## 2023-08-22 ENCOUNTER — Other Ambulatory Visit: Payer: Self-pay | Admitting: Nurse Practitioner

## 2023-08-22 ENCOUNTER — Other Ambulatory Visit (INDEPENDENT_AMBULATORY_CARE_PROVIDER_SITE_OTHER): Payer: No Typology Code available for payment source

## 2023-08-22 DIAGNOSIS — D649 Anemia, unspecified: Secondary | ICD-10-CM

## 2023-08-22 DIAGNOSIS — I700 Atherosclerosis of aorta: Secondary | ICD-10-CM

## 2023-08-22 DIAGNOSIS — E785 Hyperlipidemia, unspecified: Secondary | ICD-10-CM

## 2023-08-22 DIAGNOSIS — E1169 Type 2 diabetes mellitus with other specified complication: Secondary | ICD-10-CM

## 2023-08-22 LAB — LIPID PANEL
Cholesterol: 238 mg/dL — ABNORMAL HIGH (ref 0–200)
HDL: 49.2 mg/dL (ref >39.00)
LDL Cholesterol: 156 mg/dL — ABNORMAL HIGH (ref 0–99)
NonHDL: 188.54
Total CHOL/HDL Ratio: 5
Triglycerides: 164 mg/dL — ABNORMAL HIGH (ref 0.0–149.0)
VLDL: 32.8 mg/dL (ref 0.0–40.0)

## 2023-08-22 LAB — CBC WITH DIFFERENTIAL/PLATELET
Basophils Absolute: 0 10*3/uL (ref 0.0–0.1)
Basophils Relative: 0.7 % (ref 0.0–3.0)
Eosinophils Absolute: 0.2 10*3/uL (ref 0.0–0.7)
Eosinophils Relative: 4.4 % (ref 0.0–5.0)
HCT: 31.9 % — ABNORMAL LOW (ref 36.0–46.0)
Hemoglobin: 10.5 g/dL — ABNORMAL LOW (ref 12.0–15.0)
Lymphocytes Relative: 53.7 % — ABNORMAL HIGH (ref 12.0–46.0)
Lymphs Abs: 2.9 10*3/uL (ref 0.7–4.0)
MCHC: 32.9 g/dL (ref 30.0–36.0)
MCV: 92.9 fL (ref 78.0–100.0)
Monocytes Absolute: 0.6 10*3/uL (ref 0.1–1.0)
Monocytes Relative: 11.4 % (ref 3.0–12.0)
Neutro Abs: 1.6 10*3/uL (ref 1.4–7.7)
Neutrophils Relative %: 29.8 % — ABNORMAL LOW (ref 43.0–77.0)
Platelets: 235 10*3/uL (ref 150.0–400.0)
RBC: 3.43 Mil/uL — ABNORMAL LOW (ref 3.87–5.11)
RDW: 14.7 % (ref 11.5–15.5)
WBC: 5.4 10*3/uL (ref 4.0–10.5)

## 2023-08-22 LAB — HEPATIC FUNCTION PANEL
ALT: 11 U/L (ref 0–35)
AST: 26 U/L (ref 0–37)
Albumin: 4.4 g/dL (ref 3.5–5.2)
Alkaline Phosphatase: 83 U/L (ref 39–117)
Bilirubin, Direct: 0.1 mg/dL (ref 0.0–0.3)
Total Bilirubin: 0.5 mg/dL (ref 0.2–1.2)
Total Protein: 8.1 g/dL (ref 6.0–8.3)

## 2023-08-22 LAB — IBC + FERRITIN
Ferritin: 75.8 ng/mL (ref 10.0–291.0)
Iron: 137 ug/dL (ref 42–145)
Saturation Ratios: 33.4 % (ref 20.0–50.0)
TIBC: 410.2 ug/dL (ref 250.0–450.0)
Transferrin: 293 mg/dL (ref 212.0–360.0)

## 2023-08-22 LAB — HEMOGLOBIN A1C: Hgb A1c MFr Bld: 6.4 % (ref 4.6–6.5)

## 2023-08-22 MED ORDER — ATORVASTATIN CALCIUM 40 MG PO TABS: 40.0000 mg | ORAL_TABLET | ORAL | 3 refills | Status: DC

## 2023-08-22 NOTE — Assessment & Plan Note (Signed)
No insurance coverage for crestor. Able to tolerate atorvastatin 20mg  3x/week Repeat lipid panel: LDL and TC not at goal. increase atorvastatin dose to 40mg  3x/week. New rx sent. Also important to maintain a mediterranean diet. Return to lab for repeat lipid panel in 64month (fasting)

## 2023-08-22 NOTE — Telephone Encounter (Signed)
Received signed MD for from Dr. Isaiah Serge. Placed in PAP pending info folder pending return of patient portion  Chesley Mires, PharmD, MPH, BCPS, CPP Clinical Pharmacist (Rheumatology and Pulmonology)

## 2023-08-24 ENCOUNTER — Encounter: Payer: Self-pay | Admitting: Gastroenterology

## 2023-08-24 ENCOUNTER — Encounter: Payer: Self-pay | Admitting: Nurse Practitioner

## 2023-08-24 ENCOUNTER — Ambulatory Visit: Payer: No Typology Code available for payment source | Admitting: Gastroenterology

## 2023-08-24 VITALS — BP 111/60 | HR 73 | Temp 97.6°F | Resp 11 | Ht 60.0 in | Wt 144.0 lb

## 2023-08-24 DIAGNOSIS — D132 Benign neoplasm of duodenum: Secondary | ICD-10-CM

## 2023-08-24 DIAGNOSIS — K222 Esophageal obstruction: Secondary | ICD-10-CM

## 2023-08-24 DIAGNOSIS — K317 Polyp of stomach and duodenum: Secondary | ICD-10-CM

## 2023-08-24 DIAGNOSIS — R1310 Dysphagia, unspecified: Secondary | ICD-10-CM | POA: Diagnosis not present

## 2023-08-24 DIAGNOSIS — K2100 Gastro-esophageal reflux disease with esophagitis, without bleeding: Secondary | ICD-10-CM | POA: Diagnosis not present

## 2023-08-24 DIAGNOSIS — R1319 Other dysphagia: Secondary | ICD-10-CM

## 2023-08-24 MED ORDER — SODIUM CHLORIDE 0.9 % IV SOLN: 500.0000 mL | Freq: Once | INTRAVENOUS | Status: DC

## 2023-08-24 NOTE — Patient Instructions (Signed)
Educational handout provided to patient related to Stricture and post dilation diet  FOLLOW POST DILATION DIET, CLEAR LIQUIDS FOR 2 HOURS THEN SOFT DIET FOR THE REST OF THE DAY.  Continue present medications  Awaiting pathology results   YOU HAD AN ENDOSCOPIC PROCEDURE TODAY AT THE [facility]:   Refer to the procedure report that was given to you for any specific questions about what was found during the examination.  If the procedure report does not answer your questions, please call your gastroenterologist to clarify.  If you requested that your care partner not be given the details of your procedure findings, then the procedure report has been included in a sealed envelope for you to review at your convenience later.  YOU SHOULD EXPECT: Some feelings of bloating in the abdomen. Passage of more gas than usual.  Walking can help get rid of the air that was put into your GI tract during the procedure and reduce the bloating. If you had a lower endoscopy (such as a colonoscopy or flexible sigmoidoscopy) you may notice spotting of blood in your stool or on the toilet paper. If you underwent a bowel prep for your procedure, you may not have a normal bowel movement for a few days.  Please Note:  You might notice some irritation and congestion in your nose or some drainage.  This is from the oxygen used during your procedure.  There is no need for concern and it should clear up in a day or so.  SYMPTOMS TO REPORT IMMEDIATELY:  Following upper endoscopy (EGD)  Vomiting of blood or coffee ground material  New chest pain or pain under the shoulder blades  Painful or persistently difficult swallowing  New shortness of breath  Fever of 100F or higher  Black, tarry-looking stools  For urgent or emergent issues, a gastroenterologist can be reached at any hour by calling [phone]. Do not use MyChart messaging for urgent concerns.    DIET:  We do recommend a small meal at first,  but then you may proceed to your regular diet.  Drink plenty of fluids but you should avoid alcoholic beverages for 24 hours.  ACTIVITY:  You should plan to take it easy for the rest of today and you should NOT DRIVE or use heavy machinery until tomorrow (because of the sedation medicines used during the test).    FOLLOW UP: Our staff will call the number listed on your records the next business day following your procedure.  We will call around 7:15- 8:00 am to check on you and address any questions or concerns that you may have regarding the information given to you following your procedure. If we do not reach you, we will leave a message.     If any biopsies were taken you will be contacted by phone or by letter within the next 1-3 weeks.  Please call us at [phone] if you have not heard about the biopsies in 3 weeks.    SIGNATURES/CONFIDENTIALITY: You and/or your care partner have signed paperwork which will be entered into your electronic medical record.  These signatures attest to the fact that that the information above on your After Visit Summary has been reviewed and is understood.  Full responsibility of the confidentiality of this discharge information lies with you and/or your care-partner.

## 2023-08-24 NOTE — Progress Notes (Signed)
 See 08/01/2023 H&P no changes

## 2023-08-24 NOTE — Op Note (Signed)
[facility] Patient Name: Sandra Curtis Procedure Date: 08/24/2023 10:34 AM MRN: [id number] Endoscopist: Meryl Dare , MD, [phone] Age: 67 Referring MD:  Date of Birth: Jul 14, 1956 Gender: Female Account #: [id number] Procedure:                Upper GI endoscopy Indications:              Dysphagia, Gastroesophageal reflux disease Medicines:                Monitored Anesthesia Care Procedure:                Pre-Anesthesia Assessment:                           - Prior to the procedure, a History and Physical                            was performed, and patient medications and                            allergies were reviewed. The patient's tolerance of                            previous anesthesia was also reviewed. The risks                            and benefits of the procedure and the sedation                            options and risks were discussed with the patient.                            All questions were answered, and informed consent                            was obtained. Prior Anticoagulants: The patient has                            taken no anticoagulant or antiplatelet agents. ASA                            Grade Assessment: III - A patient with severe                            systemic disease. After reviewing the risks and                            benefits, the patient was deemed in satisfactory                            condition to undergo the procedure.                           After obtaining informed consent, the endoscope was  passed under direct vision. Throughout the                            procedure, the patient's blood pressure, pulse, and                            oxygen saturations were monitored continuously. The                            Olympus Scope [id number] was introduced through the                            mouth, and advanced to the second part of duodenum.                             The upper GI endoscopy was accomplished without                            difficulty. The patient tolerated the procedure                            well. Scope In: Scope Out: Findings:                 The mid esophagus and distal esophagus were mildly                            tortuous.                           One benign-appearing, intrinsic mild stenosis was                            found at the gastroesophageal junction. This                            stenosis measured 1.4 cm (inner diameter) x less                            than one cm (in length). The stenosis was                            traversed. A guidewire was placed and the scope was                            withdrawn. Dilation was performed with a Savary                            dilator with mild resistance at 17 mm. The dilation                            site was examined following endoscope reinsertion                            and showed moderate  mucosal disruption.                           The exam of the esophagus was otherwise normal.                           A medium-sized hiatal hernia was present.                           The gastroesophageal flap valve was visualized                            endoscopically and classified as Hill Grade IV (no                            fold, wide open lumen, hiatal hernia present).                           The exam of the stomach was otherwise normal.                           A single 6 mm sessile polyp with no bleeding was                            found in the second portion of the duodenum. The                            polyp was removed with a cold snare. Resection and                            retrieval were complete.                           The exam of the duodenum was otherwise normal. Complications:            No immediate complications. Estimated Blood Loss:     Estimated blood loss was minimal. Impression:               - Mildly tortuous  esophagus.                           - Benign-appearing esophageal stenosis. Dilated.                           - Medium-sized hiatal hernia.                           - Gastroesophageal flap valve classified as Hill                            Grade IV (no fold, wide open lumen, hiatal hernia                            present).                           -  A single duodenal polyp. Resected and retrieved. Recommendation:           - Patient has a contact number available for                            emergencies. The signs and symptoms of potential                            delayed complications were discussed with the                            patient. Return to normal activities tomorrow.                            Written discharge instructions were provided to the                            patient.                           - Clear liquid diet for 2 hours, then advance as                            tolerated to soft diet today.                           - Resume prior diet tomorrow.                           - Follow antireflux measures long term.                           - Continue present medications.                           - Await pathology results.                           - If dysphagia persists proceed with barium                            esophagram.                           - GI office appt in 1 year. Meryl Dare, MD 08/24/2023 10:55:29 AM This report has been signed electronically.

## 2023-08-24 NOTE — Progress Notes (Signed)
A/O x 3, gd SR's, VSS, report to RN

## 2023-08-24 NOTE — Progress Notes (Signed)
Pt's states no medical or surgical changes since previsit or office visit. 

## 2023-08-24 NOTE — Progress Notes (Signed)
Called to room to assist during endoscopic procedure.  Patient ID and intended procedure confirmed with present staff. Received instructions for my participation in the procedure from the performing physician.  

## 2023-08-25 ENCOUNTER — Telehealth: Payer: Self-pay | Admitting: *Deleted

## 2023-08-25 ENCOUNTER — Other Ambulatory Visit: Payer: No Typology Code available for payment source

## 2023-08-25 DIAGNOSIS — E1169 Type 2 diabetes mellitus with other specified complication: Secondary | ICD-10-CM

## 2023-08-25 LAB — MICROALBUMIN / CREATININE URINE RATIO
Creatinine,U: 107.9 mg/dL
Microalb Creat Ratio: 1.3 mg/g (ref 0.0–30.0)
Microalb, Ur: 1.4 mg/dL (ref 0.0–1.9)

## 2023-08-25 NOTE — Telephone Encounter (Signed)
Left message on f/u call 

## 2023-08-28 LAB — SURGICAL PATHOLOGY

## 2023-08-29 NOTE — Telephone Encounter (Signed)
Received [employer] application - appears apartment number was not included in address. I've remailed patient's application  Sandra Curtis, PharmD, MPH, BCPS, CPP Clinical Pharmacist (Rheumatology and Pulmonology)

## 2023-09-05 ENCOUNTER — Encounter: Payer: Self-pay | Admitting: Gastroenterology

## 2023-09-12 DIAGNOSIS — E119 Type 2 diabetes mellitus without complications: Secondary | ICD-10-CM | POA: Diagnosis not present

## 2023-09-18 NOTE — Telephone Encounter (Signed)
Ofev forms placed in pharmacy box.

## 2023-09-19 DIAGNOSIS — H5213 Myopia, bilateral: Secondary | ICD-10-CM | POA: Diagnosis not present

## 2023-09-19 NOTE — Telephone Encounter (Signed)
Received. Submitted Patient Assistance Application to [facility] for OFEV along with provider portion, patient portion, PA, medication list, insurance card copy. Will update patient when we receive a response.  Phone #: [phone] Fax #: [phone]  Chesley Mires, PharmD, MPH, BCPS, CPP Clinical Pharmacist (Rheumatology and Pulmonology)

## 2023-09-20 DIAGNOSIS — Z0100 Encounter for examination of eyes and vision without abnormal findings: Secondary | ICD-10-CM | POA: Diagnosis not present

## 2023-09-21 ENCOUNTER — Telehealth: Payer: Self-pay

## 2023-09-21 NOTE — Telephone Encounter (Signed)
Message sent to PA team to inquire about Prior Auth for Evenity.

## 2023-09-22 ENCOUNTER — Ambulatory Visit (INDEPENDENT_AMBULATORY_CARE_PROVIDER_SITE_OTHER): Payer: No Typology Code available for payment source | Admitting: Nurse Practitioner

## 2023-09-22 ENCOUNTER — Encounter: Payer: Self-pay | Admitting: Nurse Practitioner

## 2023-09-22 VITALS — BP 112/62 | HR 72 | Temp 97.9°F | Resp 18 | Wt 146.0 lb

## 2023-09-22 DIAGNOSIS — M62830 Muscle spasm of back: Secondary | ICD-10-CM | POA: Diagnosis not present

## 2023-09-22 DIAGNOSIS — J209 Acute bronchitis, unspecified: Secondary | ICD-10-CM | POA: Diagnosis not present

## 2023-09-22 DIAGNOSIS — J00 Acute nasopharyngitis [common cold]: Secondary | ICD-10-CM

## 2023-09-22 LAB — POCT INFLUENZA A/B
Influenza A, POC: NEGATIVE
Influenza B, POC: NEGATIVE

## 2023-09-22 LAB — POC COVID19 BINAXNOW: SARS Coronavirus 2 Ag: NEGATIVE

## 2023-09-22 MED ORDER — PROMETHAZINE-DM 6.25-15 MG/5ML PO SYRP: 5.0000 mL | ORAL_SOLUTION | Freq: Three times a day (TID) | ORAL | 0 refills | Status: DC | PRN

## 2023-09-22 MED ORDER — LEVOFLOXACIN 500 MG PO TABS: 500.0000 mg | ORAL_TABLET | Freq: Every day | ORAL | 0 refills | Status: AC

## 2023-09-22 NOTE — Patient Instructions (Addendum)
Vit D3 2000IU daily Call office if no improvement in 1week  Encourage adequate oral hydration. Use over-the-counter  coricidinl for congestion.  Use mucinex DM or Robitussin  or delsym for cough without congestion  You can use plain "Tylenol" or "Advil" for fever, chills and achyness. Use cool mist humidifier at bedtime to help with nasal congestion and cough.  Cold/cough medications may have tylenol or ibuprofen or guaifenesin or dextromethophan in them, so be careful not to take beyond the recommended dose for each of these medications.

## 2023-09-22 NOTE — Progress Notes (Signed)
Established Patient Visit  Patient: Sandra Curtis   DOB: [birth year]   67 y.o. Female  MRN: [id number] Visit Date: 09/22/2023  Subjective:    Chief Complaint  Patient presents with   OFFICE VISIT     PT C/O of sharp pain on left side for 3 weeks no injury or fall occurred with pain on side of face; she used OTC tylenol for symptoms. She been experiencing URI symptoms with headache congestion, fatigue, cough and body aches since Tuesday. She is due for shingles vaccine   Muscle Pain This is a recurrent problem. The current episode started 1 to 4 weeks ago. The problem occurs intermittently. The problem is unchanged. Associated with: left side pain when she lays of right side. Pain location: left flank area. The pain is mild. Exacerbated by: laying on right side. Associated symptoms include wheezing. Pertinent negatives include no chest pain, diarrhea, dysuria, fatigue, fever, headaches, stiffness, urinary symptoms or weakness. Past treatments include nothing. There is no swelling present. Her past medical history is significant for chronic back pain.  URI  This is a new problem. The current episode started in the past 7 days. The problem has been unchanged. There has been no fever. Associated symptoms include congestion, coughing, joint pain, rhinorrhea, sinus pain, sneezing, a sore throat and wheezing. Pertinent negatives include no chest pain, diarrhea, dysuria or headaches. She has tried acetaminophen for the symptoms. The treatment provided no relief.  Hx of pulmonary fibrosis: SOB with exertion. Use of albuterol with some improvement. Has non productive cough  Reviewed medical, surgical, and social history today  Medications: Outpatient Medications Prior to Visit  Medication Sig   acetaminophen (TYLENOL) 650 MG CR tablet Take 650 mg by mouth every 8 (eight) hours as needed for pain.   albuterol (VENTOLIN HFA) 108 (90 Base) MCG/ACT inhaler INHALE 1 TO 2 PUFFS INTO  THE LUNGS EVERY 6 HOURS AS NEEDED   atorvastatin (LIPITOR) 40 MG tablet Take 1 tablet (40 mg total) by mouth 3 (three) times a week.   famotidine (PEPCID) 40 MG tablet TAKE 1 TABLET(40 MG) BY MOUTH AT BEDTIME   fluticasone (FLONASE) 50 MCG/ACT nasal spray Place 2 sprays into both nostrils daily.   Nintedanib (OFEV) 100 MG CAPS TAKE 1 CAPSULE BY MOUTH TWICE A  DAY 12 HOURS APART WITH FOOD   omeprazole (PRILOSEC) 40 MG capsule Take 1 capsule (40 mg total) by mouth in the morning and at bedtime.   pregabalin (LYRICA) 50 MG capsule Take 1 capsule (50 mg total) by mouth 2 (two) times daily.   Romosozumab-aqqg (EVENITY) 105 MG/1. SOSY injection Inject 210 mg into the skin every 30 (thirty) days.   Vitamin D, Ergocalciferol, (DRISDOL) 1.25 MG (50000 UNIT) CAPS capsule Take 1 capsule (50,000 Units total) by mouth every 7 (seven) days.   No facility-administered medications prior to visit.   Reviewed past medical and social history.   ROS per HPI above      Objective:  BP 112/62 (BP Location: Left Arm, Patient Position: Sitting, Cuff Size: Large)   Pulse 72   Temp 97.9 F (36.6 C) (Temporal)   Resp 18   Wt 146 lb (66.2 kg)   SpO2 97%   BMI 28.51 kg/m      Physical Exam Vitals and nursing note reviewed.  Cardiovascular:     Rate and Rhythm: Normal rate and regular rhythm.     Pulses: Normal  pulses.     Heart sounds: Normal heart sounds.  Pulmonary:     Breath sounds: Wheezing and rales present.  Chest:     Chest wall: No tenderness.  Abdominal:     General: Bowel sounds are normal. There is no distension.     Palpations: Abdomen is soft. There is no mass.     Tenderness: There is no abdominal tenderness. There is no right CVA tenderness, left CVA tenderness or guarding.  Musculoskeletal:        General: No tenderness. Normal range of motion.     Right lower leg: No edema.     Left lower leg: No edema.  Skin:    General: Skin is warm and dry.     Findings: No erythema or  rash.  Neurological:     Mental Status: She is alert and oriented to person, place, and time.     Results for orders placed or performed in visit on 09/22/23  POC COVID-19  Result Value Ref Range   SARS Coronavirus 2 Ag Negative Negative  POCT Influenza A/B  Result Value Ref Range   Influenza A, POC Negative Negative   Influenza B, POC Negative Negative      Assessment & Plan:    Problem List Items Addressed This Visit   None Visit Diagnoses       Acute nasopharyngitis    -  Primary   Relevant Medications   levofloxacin (LEVAQUIN) 500 MG tablet   promethazine-dextromethorphan (PROMETHAZINE-DM) 6.25-15 MG/5ML syrup   Other Relevant Orders   POC COVID-19 (Completed)   POCT Influenza A/B (Completed)     Acute bronchitis, unspecified organism       Relevant Medications   levofloxacin (LEVAQUIN) 500 MG tablet   promethazine-dextromethorphan (PROMETHAZINE-DM) 6.25-15 MG/5ML syrup     Spasm of lumbar paraspinous muscle         Change sleep position, use warm compress and tylenol/ibuprofen as needed for pain. Encourage adequate oral hydration. Use over-the-counter  coricidinl for congestion.  Use mucinex DM or Robitussin  or delsym for cough without congestion  You can use plain "Tylenol" or "Advil" for fever, chills and achyness. Use cool mist humidifier at bedtime to help with nasal congestion and cough.  Cold/cough medications may have tylenol or ibuprofen or guaifenesin or dextromethophan in them, so be careful not to take beyond the recommended dose for each of these medications.  Return if symptoms worsen or fail to improve.     Alysia Penna, NP

## 2023-09-27 ENCOUNTER — Telehealth: Payer: Self-pay | Admitting: Pulmonary Disease

## 2023-09-27 NOTE — Telephone Encounter (Signed)
Patient's daughter, Abagale Malecki, brought in a FMLA form for her employer so she can transport her mother to medical appointments.  I have completed the form and put into Dr. Shirlee More office for signature.  The form is to be faxed to [phone].  Daughter would like a call when the form has been submitted - ph# [phone].   She is aware of the $29 processing fee

## 2023-09-28 ENCOUNTER — Telehealth: Payer: Self-pay | Admitting: Pulmonary Disease

## 2023-09-28 NOTE — Telephone Encounter (Signed)
Encounter entered in error.

## 2023-09-28 NOTE — Telephone Encounter (Signed)
Dr. Isaiah Serge signed the FMLA form for patient's daughter Clarisa Sastre.  I faxed to the number she provided [phone].  Called patient to let her know form was sent then mailed hard copy.   $29 processing fee was added to the chart.   Patient also needed to talk to a nurse about her coughing and wheezing.  I sent message to Triage and [employer].

## 2023-10-02 ENCOUNTER — Telehealth: Payer: Self-pay

## 2023-10-02 ENCOUNTER — Other Ambulatory Visit (HOSPITAL_COMMUNITY): Payer: Self-pay

## 2023-10-02 NOTE — Telephone Encounter (Signed)
Pharmacy Patient Advocate Encounter  Received notification from  [facility]  that Prior Authorization for Prolia has been APPROVED from 10/02/23 to 10/01/24 for 2 visits.   PA #/Case ID/Reference #: [id number]

## 2023-10-02 NOTE — Telephone Encounter (Signed)
Pharmacy Patient Advocate Encounter   Received notification from  [employer] Portal that prior authorization for Prolia is required/requested.   Insurance verification completed.   The patient is insured through  [facility]  .   Per test claim: PA required; PA submitted to above mentioned insurance via Availity Key/confirmation #/EOC -- Status is pending

## 2023-10-02 NOTE — Telephone Encounter (Signed)
Received a fax from  [facility] regarding an approval for OFEV patient assistance from 09/29/2023 to 10/09/2024. Approval letter sent to scan center.  Phone #: [phone] Fax #: [phone] Patient ID: [id number]  Chesley Mires, PharmD, MPH, BCPS, CPP Clinical Pharmacist (Rheumatology and Pulmonology)

## 2023-10-02 NOTE — Telephone Encounter (Signed)
Pt ready for scheduling for Prolia on or after : 10/02/23  Out-of-pocket cost due at time of visit: $~302  Number of injection/visits approved: 2  Primary: [employer] - Medicare  Prolia co-insurance: 20% Admin fee co-insurance: 0%  Secondary: N/A Prolia co-insurance:  Admin fee co-insurance:   Medical Benefit Details: Date Benefits were checked: 10/02/23 Deductible: no/ Coinsurance: 20%/ Admin Fee: 0  Prior Auth: Approved PA# [id number] Expiration Date: 10/02/23 to 10/01/24  # of doses approved: 2  Pharmacy benefit: Copay $285 If patient wants fill through the pharmacy benefit please send prescription to:  [facility] , and include estimated need by date in rx notes. Pharmacy will ship medication directly to the office.  Patient not eligible for Prolia Copay Card. Copay Card can make patient's cost as little as $25. Link to apply: [email or web address]://[email or web address]  ** This summary of benefits is an estimation of the patient's out-of-pocket cost. Exact cost may very based on individual plan coverage.

## 2023-10-03 DIAGNOSIS — M8000XA Age-related osteoporosis with current pathological fracture, unspecified site, initial encounter for fracture: Secondary | ICD-10-CM

## 2023-10-05 ENCOUNTER — Telehealth: Payer: Self-pay

## 2023-10-05 NOTE — Telephone Encounter (Signed)
Evenity VOB initiated via [email or web address]

## 2023-10-07 NOTE — Telephone Encounter (Signed)
I called and discussed with the patients. She feels better now and nothing further is needed

## 2023-10-09 ENCOUNTER — Encounter: Payer: Self-pay | Admitting: Nurse Practitioner

## 2023-10-09 NOTE — Telephone Encounter (Signed)
Checked [employer], Status is holding for future verification

## 2023-10-12 ENCOUNTER — Telehealth: Payer: Self-pay

## 2023-10-12 ENCOUNTER — Other Ambulatory Visit (HOSPITAL_COMMUNITY): Payer: Self-pay

## 2023-10-12 DIAGNOSIS — M8000XA Age-related osteoporosis with current pathological fracture, unspecified site, initial encounter for fracture: Secondary | ICD-10-CM

## 2023-10-12 MED ORDER — DENOSUMAB 60 MG/ML ~~LOC~~ SOSY: 60.0000 mg | PREFILLED_SYRINGE | Freq: Once | SUBCUTANEOUS | Status: DC

## 2023-10-12 NOTE — Telephone Encounter (Signed)
 Prolia VOB initiated via [email or web address]

## 2023-10-16 NOTE — Telephone Encounter (Signed)
 Marland Kitchen

## 2023-10-17 ENCOUNTER — Other Ambulatory Visit (HOSPITAL_COMMUNITY): Payer: Self-pay

## 2023-10-17 NOTE — Telephone Encounter (Signed)
 Pt ready for scheduling for PROLIA  on or after : 10/17/23  Out-of-pocket cost due at time of visit: $323  Number of injection/visits approved: 2  Primary: [employer] Prolia  co-insurance: 20% Admin fee co-insurance: 0%  Secondary: --- Prolia  co-insurance:  Admin fee co-insurance:   Medical Benefit Details: Date Benefits were checked: 10/13/23 Deductible: NO/ Coinsurance: 20%/ Admin Fee: 0%  Prior Auth: APPROVED PA# [id number] Expiration Date: 10/02/23-10/01/24  # of doses approved: 2  Pharmacy benefit: Copay $849.11 If patient wants fill through the pharmacy benefit please send prescription to: [facility], and include estimated need by date in rx notes. Pharmacy will ship medication directly to the office.  Patient NOT eligible for Prolia  Copay Card. Copay Card can make patient's cost as little as $25. Link to apply: [email or web address]://[email or web address].[email or web address]  ** This summary of benefits is an estimation of the patient's out-of-pocket cost. Exact cost may very based on individual plan coverage.

## 2023-10-27 ENCOUNTER — Telehealth: Payer: Self-pay

## 2023-10-27 NOTE — Telephone Encounter (Signed)
Pharmacy Patient Advocate Encounter   Received notification from  [employer] Portal that prior authorization for Evenity is required/requested.   Insurance verification completed.   The patient is insured through  [facility]  .   Per test claim: PA required; PA submitted to above mentioned insurance via Availity Key/confirmation #/EOC [id number] Status is pending

## 2023-10-27 NOTE — Telephone Encounter (Signed)
-----   Message from [facility] sent at 10/18/2023  2:59 PM EST ----- Regarding: FW: Osteoporosis  ----- Message ----- From: Persons, [place], [place] Sent: 08/22/2023   8:09 AM EST To: Cherre Huger, RT Subject: FW: Osteoporosis                                ----- Message ----- From: [name], NP Sent: 08/18/2023   1:52 PM EST To: [place] Persons, [place] Subject: Osteoporosis                                   Ms. Gittens has agreed to start Evenity monthly injections x 12months, then switch to prolia every 6months. Hopefully we can get this approved by her insurance. Does you office administer this or does she need to return to me?  Alysia Penna, NP

## 2023-10-27 NOTE — Telephone Encounter (Signed)
VOB submitted for [employer] through [employer].

## 2023-10-27 NOTE — Telephone Encounter (Signed)
 Sandra Curtis

## 2023-11-01 ENCOUNTER — Other Ambulatory Visit: Payer: Self-pay

## 2023-11-01 ENCOUNTER — Encounter (HOSPITAL_COMMUNITY): Payer: Self-pay

## 2023-11-01 MED ORDER — DENOSUMAB 60 MG/ML ~~LOC~~ SOSY
60.0000 mg | PREFILLED_SYRINGE | Freq: Once | SUBCUTANEOUS | 0 refills | Status: AC
  Filled 2023-11-01: qty 1, 1d supply, fill #0

## 2023-11-01 NOTE — Telephone Encounter (Signed)
Pharmacy Patient Advocate Encounter  Received notification from  [facility]  that Prior Authorization for Sheilah Mins has been APPROVED from 10/27/23 to 10/26/24  for 12 visits   PA #/Case ID/Reference #: [id number]

## 2023-11-01 NOTE — Telephone Encounter (Signed)
See 10/12/23 TE for additional correspondence.

## 2023-11-01 NOTE — Progress Notes (Signed)
Pharmacy Patient Advocate Encounter  Insurance verification completed.   The patient is insured through [employer] test claim for Prolia. Currently a quantity of 1 ml is a 180 day supply and the co-pay is $856.77.   Patient may be eligible for a Medicare prescription Payment plan. The patient will need to reach out to their insurance company to enroll in the payment plan to spread out their payments throughout the year, If available.

## 2023-11-01 NOTE — Telephone Encounter (Signed)
Script for Prolia sent to [employer] to have delivered to the office.

## 2023-11-01 NOTE — Addendum Note (Signed)
Addended by: Larey Dresser on: 11/01/2023 11:45 AM   Modules accepted: Orders

## 2023-11-02 ENCOUNTER — Telehealth: Payer: Self-pay

## 2023-11-02 ENCOUNTER — Other Ambulatory Visit: Payer: Self-pay

## 2023-11-02 ENCOUNTER — Telehealth: Payer: Self-pay | Admitting: Pharmacy Technician

## 2023-11-02 DIAGNOSIS — M8000XS Age-related osteoporosis with current pathological fracture, unspecified site, sequela: Secondary | ICD-10-CM

## 2023-11-02 NOTE — Telephone Encounter (Signed)
Copied from CRM [id number]. Topic: Clinical - Prescription Issue >> Nov 02, 2023 10:19 AM Sonny Dandy B wrote: Reason for CRM: Tiffany from [place] pharmacy called regarding prescription for Prolia. Requesting a call a back ,states the medication is too expensive for the pt. Request the provider to change the medication to something different [phone] requesting provider to look for a medication that is more affordable for the pt. States there is no medication assistance for Prolia.

## 2023-11-02 NOTE — Progress Notes (Signed)
Due to high copay, patient will not be filling Prolia. There is no copay assistance available for this disease state/medication. Patient does not want to enroll in medicare payment plan. Office will look into alternative options for her. Dis-enrolling.

## 2023-11-02 NOTE — Telephone Encounter (Signed)
Did you want patient to do Evenity or Prolia?

## 2023-11-02 NOTE — Assessment & Plan Note (Signed)
Unable to tolerate fosamax due to GERD with esophagitis. Unable to afford fosamax Switched rx to reclast IV

## 2023-11-02 NOTE — Telephone Encounter (Signed)
Spoke with Olegario Messier. She states their office completes the PA for Reclast infusions and contacts the pt to inform them of insurance coverage. They do not know how much the patient's copay would be. The pt would have to contact their ins comp to inquire about copay amount.

## 2023-11-02 NOTE — Telephone Encounter (Signed)
Auth Submission: APPROVED Site of care: Site of care: [facility] Payer: [employer]ED Medication & CPT/J Code(s) submitted: Reclast (Zolendronic acid) [id number] Route of submission (phone, fax, portal): AVALITY/PORTAL Phone # Fax # Auth type: Buy/Bill PB Units/visits requested: X1 DOSE Reference number:[id number]  Approval from: 11/02/23 to 10/09/24

## 2023-11-02 NOTE — Telephone Encounter (Signed)
No Problem. Thank you. 

## 2023-11-08 ENCOUNTER — Ambulatory Visit (INDEPENDENT_AMBULATORY_CARE_PROVIDER_SITE_OTHER): Payer: No Typology Code available for payment source

## 2023-11-08 VITALS — BP 122/67 | HR 71 | Temp 97.7°F | Resp 16 | Ht 60.0 in | Wt 142.4 lb

## 2023-11-08 DIAGNOSIS — M810 Age-related osteoporosis without current pathological fracture: Secondary | ICD-10-CM

## 2023-11-08 DIAGNOSIS — M8000XS Age-related osteoporosis with current pathological fracture, unspecified site, sequela: Secondary | ICD-10-CM | POA: Diagnosis not present

## 2023-11-08 MED ORDER — ACETAMINOPHEN 325 MG PO TABS
650.0000 mg | ORAL_TABLET | Freq: Once | ORAL | Status: AC
Start: 2023-11-08 — End: 2023-11-08
  Administered 2023-11-08: 650 mg via ORAL
  Filled 2023-11-08: qty 2

## 2023-11-08 MED ORDER — DIPHENHYDRAMINE HCL 25 MG PO CAPS
25.0000 mg | ORAL_CAPSULE | Freq: Once | ORAL | Status: AC
  Administered 2023-11-08: 25 mg via ORAL
  Filled 2023-11-08: qty 1

## 2023-11-08 MED ORDER — ZOLEDRONIC ACID 5 MG/100ML IV SOLN
5.0000 mg | Freq: Once | INTRAVENOUS | Status: AC
Start: 2023-11-08 — End: 2023-11-08
  Administered 2023-11-08: 5 mg via INTRAVENOUS
  Filled 2023-11-08: qty 100

## 2023-11-08 NOTE — Progress Notes (Signed)
Diagnosis: Osteoporosis  Provider:  Chilton Greathouse MD  Procedure: IV Infusion  IV Type: Peripheral, IV Location: R Antecubital  Reclast (Zolendronic Acid), Dose: 5 mg  Infusion Start Time: 1100  Infusion Stop Time: 1132  Post Infusion IV Care: Observation period completed and Peripheral IV Discontinued  Discharge: Condition: Good, Destination: Home . AVS Provided  Performed by:  Rico Ala, LPN

## 2023-11-08 NOTE — Patient Instructions (Signed)

## 2023-11-21 ENCOUNTER — Other Ambulatory Visit (HOSPITAL_COMMUNITY): Payer: Self-pay | Admitting: Pharmacy Technician

## 2023-11-21 ENCOUNTER — Other Ambulatory Visit (HOSPITAL_COMMUNITY): Payer: Self-pay

## 2023-11-21 MED ORDER — EVENITY 105 MG/1.17ML ~~LOC~~ SOSY
210.0000 mg | PREFILLED_SYRINGE | SUBCUTANEOUS | 0 refills | Status: DC
  Filled 2023-11-21: qty 2.34, 28d supply, fill #0

## 2023-12-11 ENCOUNTER — Ambulatory Visit: Payer: No Typology Code available for payment source | Admitting: Gastroenterology

## 2023-12-11 ENCOUNTER — Encounter: Payer: Self-pay | Admitting: Gastroenterology

## 2023-12-11 VITALS — BP 112/60 | HR 102 | Ht 60.0 in | Wt 144.0 lb

## 2023-12-11 DIAGNOSIS — Z1212 Encounter for screening for malignant neoplasm of rectum: Secondary | ICD-10-CM

## 2023-12-11 DIAGNOSIS — K219 Gastro-esophageal reflux disease without esophagitis: Secondary | ICD-10-CM | POA: Diagnosis not present

## 2023-12-11 DIAGNOSIS — R1310 Dysphagia, unspecified: Secondary | ICD-10-CM

## 2023-12-11 DIAGNOSIS — K222 Esophageal obstruction: Secondary | ICD-10-CM

## 2023-12-11 MED ORDER — PANTOPRAZOLE SODIUM 40 MG PO TBEC: 40.0000 mg | DELAYED_RELEASE_TABLET | Freq: Two times a day (BID) | ORAL | 3 refills | Status: DC

## 2023-12-11 NOTE — Patient Instructions (Signed)
 Stop omeprazole.  We have sent the following medications to your pharmacy for you to pick up at your convenience: pantoprazole 40 mg twice daily.   _______________________________________________________  If your blood pressure at your visit was 140/90 or greater, please contact your primary care physician to follow up on this.  _______________________________________________________  If you are age 68 or older, your body mass index should be between 23-30. Your Body mass index is 28.12 kg/m. If this is out of the aforementioned range listed, please consider follow up with your Primary Care Provider.  If you are age 9 or younger, your body mass index should be between 19-25. Your Body mass index is 28.12 kg/m. If this is out of the aformentioned range listed, please consider follow up with your Primary Care Provider.   ________________________________________________________  The [place] [facility] providers would like to encourage you to use [facility] to communicate with providers for non-urgent requests or questions.  Due to long hold times on the telephone, sending your provider a message by [facility] may be a faster and more efficient way to get a response.  Please allow 48 business hours for a response.  Please remember that this is for non-urgent requests.  _______________________________________________________

## 2023-12-11 NOTE — Progress Notes (Signed)
 Chief Complaint: Dysphagia and GERD Primary GI MD: Previous patient of Dr. Russella Dar  HPI: 68 year old female history of GERD, hiatal hernia, esophageal strictures s/p dilation, presents for evaluation of dysphagia and GERD  Last seen by Dr. Russella Dar 08/01/2023.  History of GERD with LA class C esophagitis, medium size hiatal hernia, worsening reflux and dysphagia symptoms at that time and was suspected to have a recurrent stricture.  Patient was put on omeprazole 40 Mg twice daily and famotidine 40 Mg at bedtime.  She was set up for EGD November 2024 which showed mildly thrombus esophagus, benign-appearing esophageal stenosis that was dilated.  --------------TODAY-----------------------  Patient presents today for evaluation of dysphagia and GERD.  She states after her endoscopy November 2024 she had a resolution in her GERD and dysphagia for about 1 month before return.  She states anytime she eats solid food it gets stuck in her suprasternal notch and she often has to regurgitate it.  She also has severe burning in her chest with a eating which results in her avoiding eating.  She is on omeprazole 40 Mg once daily and famotidine 40 Mg once daily.  Denies weight loss, nausea, vomiting.  Her last colonoscopy was in 2015 and was normal.  She is due for repeat.  Denies family history of colon cancer.  She is not interested in getting colonoscopy at this time.  She also would really prefer to avoid EGD if possible    PREVIOUS GI WORKUP   EGD 08/24/2023 for worsening GERD and dysphagia  - Mildly tortuous esophagus.  - Benign- appearing esophageal stenosis. Dilated. - Medium- sized hiatal hernia.  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  - A single duodenal polyp. Resected and retrieved.  EGD Sept 2023 - Benign-appearing esophageal stenosis. Dilated.  - Medium-sized hiatal hernia.  - Normal duodenal bulb and second portion of  the duodenum.  - No specimens collected.  July 2024 echo shows EF=60=65%   Colonoscopy 12/2013 for heme positive stool - Normal colon - Small internal hemorrhoids - Repeat 12/2023  Past Medical History:  Diagnosis Date   Allergic rhinitis    Allergy    Chronic back pain 07/20/2018   Chronic midline low back pain 02/06/2017   Depression    Esophageal stricture    GERD (gastroesophageal reflux disease)    Inflammatory polyps of colon (HCC) 2010   Pulmonary filariasis (HCC)    Systolic murmur    UTI (urinary tract infection)     Past Surgical History:  Procedure Laterality Date   BREAST BIOPSY Left 01/22/2021   COLONOSCOPY     UPPER GASTROINTESTINAL ENDOSCOPY      Current Outpatient Medications  Medication Sig Dispense Refill   acetaminophen (TYLENOL) 650 MG CR tablet Take 650 mg by mouth every 8 (eight) hours as needed for pain.     albuterol (VENTOLIN HFA) 108 (90 Base) MCG/ACT inhaler INHALE 1 TO 2 PUFFS INTO THE LUNGS EVERY 6 HOURS AS NEEDED 6.7 g 0   atorvastatin (LIPITOR) 40 MG tablet Take 1 tablet (40 mg total) by mouth 3 (three) times a week. 36 tablet 3   famotidine (PEPCID) 40 MG tablet TAKE 1 TABLET(40 MG) BY MOUTH AT BEDTIME 90 tablet 1   fluticasone (FLONASE) 50 MCG/ACT nasal spray Place 2 sprays into both nostrils daily. 16 g 1   Nintedanib (OFEV) 100 MG CAPS TAKE 1 CAPSULE BY MOUTH TWICE A  DAY 12 HOURS  APART WITH FOOD     pantoprazole (PROTONIX) 40 MG tablet Take 1 tablet (40 mg total) by mouth 2 (two) times daily. 60 tablet 3   pregabalin (LYRICA) 50 MG capsule Take 1 capsule (50 mg total) by mouth 2 (two) times daily. 180 capsule 1   promethazine-dextromethorphan (PROMETHAZINE-DM) 6.25-15 MG/5ML syrup Take 5 mLs by mouth 3 (three) times daily as needed for cough. 180 mL 0   Vitamin D, Ergocalciferol, (DRISDOL) 1.25 MG (50000 UNIT) CAPS capsule Take 1 capsule (50,000 Units total) by mouth every 7 (seven) days. 12 capsule 0   No current facility-administered  medications for this visit.    Allergies as of 12/11/2023 - Review Complete 12/11/2023  Allergen Reaction Noted   Iodine Hives 07/11/2012    Family History  Problem Relation Age of Onset   Arthritis Mother    Thyroid disease Mother    Heart disease Father    Hypertension Father    Diabetes Father    Arthritis Sister    Diabetes Brother    Colon cancer Neg Hx    Stomach cancer Neg Hx    Rectal cancer Neg Hx    Esophageal cancer Neg Hx     Social History   Socioeconomic History   Marital status: Single    Spouse name: Not on file   Number of children: 1   Years of education: Not on file   Highest education level: 12th grade  Occupational History   Occupation: Biomedical engineer: paperworks  Tobacco Use   Smoking status: Former    Current packs/day: 0.00    Average packs/day: 0.5 packs/day for 35.0 years (17.5 ttl pk-yrs)    Types: Cigarettes    Start date: 04/13/1983    Quit date: 04/12/2018    Years since quitting: 5.6    Passive exposure: Never   Smokeless tobacco: Never  Vaping Use   Vaping status: Never Used  Substance and Sexual Activity   Alcohol use: Not Currently   Drug use: No   Sexual activity: Not Currently    Birth control/protection: Post-menopausal  Other Topics Concern   Not on file  Social History Narrative   Not on file   Social Drivers of Health   Financial Resource Strain: Medium Risk (04/22/2023)   Overall Financial Resource Strain (CARDIA)    Difficulty of Paying Living Expenses: Somewhat hard  Food Insecurity: Food Insecurity Present (04/22/2023)   Hunger Vital Sign    Worried About Running Out of Food in the Last Year: Sometimes true    Ran Out of Food in the Last Year: Never true  Transportation Needs: No Transportation Needs (04/22/2023)   PRAPARE - Administrator, Civil Service (Medical): No    Lack of Transportation (Non-Medical): No  Physical Activity: Sufficiently Active (04/22/2023)   Exercise Vital Sign    Days  of Exercise per Week: 5 days    Minutes of Exercise per Session: 30 min  Stress: No Stress Concern Present (04/22/2023)   [employer] of Occupational Health - Occupational Stress Questionnaire    Feeling of Stress : Not at all  Social Connections: Unknown (04/22/2023)   Social Connection and Isolation Panel [NHANES]    Frequency of Communication with Friends and Family: More than three times a week    Frequency of Social Gatherings with Friends and Family: More than three times a week    Attends Religious Services: Patient declined    Database administrator or Organizations: No  Attends Banker Meetings: Never    Marital Status: Never married  Intimate Partner Violence: Not At Risk (01/13/2023)   Humiliation, Afraid, Rape, and Kick questionnaire    Fear of Current or Ex-Partner: No    Emotionally Abused: No    Physically Abused: No    Sexually Abused: No    Review of Systems:    Constitutional: No weight loss, fever, chills, weakness or fatigue HEENT: Eyes: No change in vision               Ears, Nose, Throat:  No change in hearing or congestion Skin: No rash or itching Cardiovascular: No chest pain, chest pressure or palpitations   Respiratory: No SOB or cough Gastrointestinal: See HPI and otherwise negative Genitourinary: No dysuria or change in urinary frequency Neurological: No headache, dizziness or syncope Musculoskeletal: No new muscle or joint pain Hematologic: No bleeding or bruising Psychiatric: No history of depression or anxiety    Physical Exam:  Vital signs: BP 112/60 (BP Location: Left Arm, Patient Position: Sitting, Cuff Size: Normal)   Pulse (!) 102   Ht 5' (1.524 m)   Wt 144 lb (65.3 kg)   BMI 28.12 kg/m   Constitutional: NAD, Well developed, Well nourished, alert and cooperative Head:  Normocephalic and atraumatic. Eyes:   PEERL, EOMI. No icterus. Conjunctiva pink. Respiratory: Respirations even and unlabored. Lungs clear to  auscultation bilaterally.   No wheezes, crackles, or rhonchi.  Cardiovascular:  Regular rate and rhythm. No peripheral edema, cyanosis or pallor.  Gastrointestinal:  Soft, nondistended, nontender. No rebound or guarding. Normal bowel sounds. No appreciable masses or hepatomegaly. Rectal:  Not performed.  Msk:  Symmetrical without gross deformities. Without edema, no deformity or joint abnormality.  Neurologic:  Alert and  oriented x4;  grossly normal neurologically.  Skin:   Dry and intact without significant lesions or rashes. Psychiatric: Oriented to person, place and time. Demonstrates good judgement and reason without abnormal affect or behaviors.   RELEVANT LABS AND IMAGING: CBC    Component Value Date/Time   WBC 5.4 08/22/2023 1042   RBC 3.43 (L) 08/22/2023 1042   HGB 10.5 (L) 08/22/2023 1042   HCT 31.9 (L) 08/22/2023 1042   PLT 235.0 08/22/2023 1042   MCV 92.9 08/22/2023 1042   MCHC 32.9 08/22/2023 1042   RDW 14.7 08/22/2023 1042   LYMPHSABS 2.9 08/22/2023 1042   MONOABS 0.6 08/22/2023 1042   EOSABS 0.2 08/22/2023 1042   BASOSABS 0.0 08/22/2023 1042    CMP     Component Value Date/Time   NA 139 04/26/2023 1118   K 4.7 04/26/2023 1118   CL 103 04/26/2023 1118   CO2 24 04/26/2023 1118   GLUCOSE 110 (H) 04/26/2023 1118   BUN 22 04/26/2023 1118   CREATININE 1.32 (H) 04/26/2023 1118   CALCIUM 9.7 04/26/2023 1118   PROT 8.1 08/22/2023 1042   ALBUMIN 4.4 08/22/2023 1042   AST 26 08/22/2023 1042   ALT 11 08/22/2023 1042   ALKPHOS 83 08/22/2023 1042   BILITOT 0.5 08/22/2023 1042     Assessment/Plan:   GERD Dysphagia History of small hiatal hernia and esophageal strictures s/p dilation with the most recent EGD November 2024 resulting in 1 month improvement of her symptoms.  Currently having recurrent symptoms on omeprazole 40 Mg once daily and Pepcid 40 Mg once daily.  Extensive discussion with patient about repeat EGD which she is not interested in this time.  She  would prefer conservative management.  Question  whether patient could be a TIF candidate, will defer to Dr. Dorsey/Dr. Barron Alvine.  Patient prefers a female provider. - Discontinue omeprazole.  Start pantoprazole 40 Mg twice daily - Continue famotidine 40 Mg at bedtime - Educated patient on lifestyle modifications and provided patient education handout - Close follow-up in 6 to 8 weeks.  If no improvement in symptoms which strongly recommend repeat EGD with dilation for evaluation of recurrent stricture - Will send this to Dr. Dorsey/Dr. Barron Alvine for evaluation if patient could potentially be a TIF candidate in the future  Colon cancer screening Last colonoscopy in 2015, normal, repeat 12/2023.  Patient not interested in colonoscopy at this time.  She would like to revisit this discussion at our follow-up.  Lara Mulch [facility] 12/11/2023, 2:47 PM  Cc: [name], NP

## 2023-12-12 ENCOUNTER — Ambulatory Visit: Payer: No Typology Code available for payment source | Admitting: Pulmonary Disease

## 2023-12-22 NOTE — Progress Notes (Signed)
 I agree with the assessment and plan as outlined by Ms. McMichael. Patient's dysphagia and GERD may be benefit from PPI 40 mg BID treatment. Reviewing her last EGD, she was dilated to 17 mm so there is room for larger dilation if she would be interested in the future.

## 2024-01-22 ENCOUNTER — Ambulatory Visit (INDEPENDENT_AMBULATORY_CARE_PROVIDER_SITE_OTHER): Payer: No Typology Code available for payment source

## 2024-01-22 ENCOUNTER — Other Ambulatory Visit: Payer: Self-pay | Admitting: Nurse Practitioner

## 2024-01-22 DIAGNOSIS — Z0000 Encounter for general adult medical examination without abnormal findings: Secondary | ICD-10-CM | POA: Diagnosis not present

## 2024-01-22 DIAGNOSIS — E559 Vitamin D deficiency, unspecified: Secondary | ICD-10-CM

## 2024-01-22 NOTE — Patient Instructions (Signed)
 Sandra Curtis , Thank you for taking time to come for your Medicare Wellness Visit. I appreciate your ongoing commitment to your health goals. Please review the following plan we discussed and let me know if I can assist you in the future.   Referrals/Orders/Follow-Ups/Clinician Recommendations: none  This is a list of the screening recommended for you and due dates:  Health Maintenance  Topic Date Due   Zoster (Shingles) Vaccine (1 of 2) Never done   COVID-19 Vaccine (2 - 2024-25 season) 06/11/2023   Eye exam for diabetics  09/23/2023   Colon Cancer Screening  01/04/2024   Hemoglobin A1C  02/19/2024   Yearly kidney function blood test for diabetes  04/25/2024   Flu Shot  05/10/2024   Yearly kidney health urinalysis for diabetes  08/24/2024   Complete foot exam   09/21/2024   Medicare Annual Wellness Visit  01/21/2025   Mammogram  02/02/2025   DTaP/Tdap/Td vaccine (2 - Td or Tdap) 12/09/2027   Pneumonia Vaccine  Completed   DEXA scan (bone density measurement)  Completed   Hepatitis C Screening  Completed   HPV Vaccine  Aged Out   Meningitis B Vaccine  Aged Out    Advanced directives: (ACP Link)Information on Advanced Care Planning can be found at [place]  Secretary of State Advance Health Care Directives Advance Health Care Directives. [email or web address]   Next Medicare Annual Wellness Visit scheduled for next year: Yes  insert Preventive Care attachment Insert FALL PREVENTION attachment if needed

## 2024-01-22 NOTE — Progress Notes (Signed)
 Subjective:   Sandra Curtis is a 68 y.o. who presents for a Medicare Wellness preventive visit.  Visit Complete: Virtual I connected with  Sandra Curtis on 01/22/24 by a video and audio enabled telemedicine application and verified that I am speaking with the correct person using two identifiers.  Patient Location: Home  Provider Location: Office/Clinic  I discussed the limitations of evaluation and management by telemedicine. The patient expressed understanding and agreed to proceed.  Vital Signs: Because this visit was a virtual/telehealth visit, some criteria may be missing or patient reported. Any vitals not documented were not able to be obtained and vitals that have been documented are patient reported.    Persons Participating in Visit: Patient.  AWV Questionnaire: No: Patient Medicare AWV questionnaire was not completed prior to this visit.  Cardiac Risk Factors include: advanced age (>32men, >23 women);diabetes mellitus;dyslipidemia     Objective:    Today's Vitals   There is no height or weight on file to calculate BMI.     01/22/2024    9:30 AM 11/08/2023   10:28 AM 01/13/2023   10:42 AM 07/08/2021    2:38 PM 10/27/2020   11:07 AM 03/01/2017    8:50 AM  Advanced Directives  Does Patient Have a Medical Advance Directive? No Yes No No No No  Type of Advance Directive  Living will      Would patient like information on creating a medical advance directive? No - Patient declined  Yes (MAU/Ambulatory/Procedural Areas - Information given) No - Patient declined No - Patient declined No - Patient declined    Current Medications (verified) Outpatient Encounter Medications as of 01/22/2024  Medication Sig   acetaminophen (TYLENOL) 650 MG CR tablet Take 650 mg by mouth every 8 (eight) hours as needed for pain.   albuterol (VENTOLIN HFA) 108 (90 Base) MCG/ACT inhaler INHALE 1 TO 2 PUFFS INTO THE LUNGS EVERY 6 HOURS AS NEEDED   atorvastatin (LIPITOR) 40 MG  tablet Take 1 tablet (40 mg total) by mouth 3 (three) times a week.   famotidine (PEPCID) 40 MG tablet TAKE 1 TABLET(40 MG) BY MOUTH AT BEDTIME   Nintedanib (OFEV) 100 MG CAPS TAKE 1 CAPSULE BY MOUTH TWICE A  DAY 12 HOURS APART WITH FOOD   pantoprazole (PROTONIX) 40 MG tablet Take 1 tablet (40 mg total) by mouth 2 (two) times daily.   pregabalin (LYRICA) 50 MG capsule Take 1 capsule (50 mg total) by mouth 2 (two) times daily.   promethazine-dextromethorphan (PROMETHAZINE-DM) 6.25-15 MG/5ML syrup Take 5 mLs by mouth 3 (three) times daily as needed for cough.   Vitamin D, Ergocalciferol, (DRISDOL) 1.25 MG (50000 UNIT) CAPS capsule Take 1 capsule (50,000 Units total) by mouth every 7 (seven) days.   fluticasone (FLONASE) 50 MCG/ACT nasal spray Place 2 sprays into both nostrils daily.   No facility-administered encounter medications on file as of 01/22/2024.    Allergies (verified) Iodine   History: Past Medical History:  Diagnosis Date   Allergic rhinitis    Allergy    Chronic back pain 07/20/2018   Chronic midline low back pain 02/06/2017   Depression    Esophageal stricture    GERD (gastroesophageal reflux disease)    Inflammatory polyps of colon (HCC) 2010   Pulmonary filariasis (HCC)    Systolic murmur    UTI (urinary tract infection)    Past Surgical History:  Procedure Laterality Date   BREAST BIOPSY Left 01/22/2021   COLONOSCOPY  UPPER GASTROINTESTINAL ENDOSCOPY     Family History  Problem Relation Age of Onset   Arthritis Mother    Thyroid disease Mother    Heart disease Father    Hypertension Father    Diabetes Father    Arthritis Sister    Diabetes Brother    Colon cancer Neg Hx    Stomach cancer Neg Hx    Rectal cancer Neg Hx    Esophageal cancer Neg Hx    Social History   Socioeconomic History   Marital status: Single    Spouse name: Not on file   Number of children: 1   Years of education: Not on file   Highest education level: 12th grade   Occupational History   Occupation: Biomedical engineer: paperworks  Tobacco Use   Smoking status: Former    Current packs/day: 0.00    Average packs/day: 0.5 packs/day for 35.0 years (17.5 ttl pk-yrs)    Types: Cigarettes    Start date: 04/13/1983    Quit date: 04/12/2018    Years since quitting: 5.7    Passive exposure: Never   Smokeless tobacco: Never  Vaping Use   Vaping status: Never Used  Substance and Sexual Activity   Alcohol use: Not Currently   Drug use: No   Sexual activity: Not Currently    Birth control/protection: Post-menopausal  Other Topics Concern   Not on file  Social History Narrative   Not on file   Social Drivers of Health   Financial Resource Strain: Low Risk  (01/22/2024)   Overall Financial Resource Strain (CARDIA)    Difficulty of Paying Living Expenses: Not hard at all  Food Insecurity: No Food Insecurity (01/22/2024)   Hunger Vital Sign    Worried About Running Out of Food in the Last Year: Never true    Ran Out of Food in the Last Year: Never true  Transportation Needs: No Transportation Needs (01/22/2024)   PRAPARE - Administrator, Civil Service (Medical): No    Lack of Transportation (Non-Medical): No  Physical Activity: Insufficiently Active (01/22/2024)   Exercise Vital Sign    Days of Exercise per Week: 7 days    Minutes of Exercise per Session: 10 min  Stress: No Stress Concern Present (01/22/2024)   [employer] of Occupational Health - Occupational Stress Questionnaire    Feeling of Stress : Not at all  Social Connections: Socially Isolated (01/22/2024)   Social Connection and Isolation Panel [NHANES]    Frequency of Communication with Friends and Family: More than three times a week    Frequency of Social Gatherings with Friends and Family: Once a week    Attends Religious Services: Never    Database administrator or Organizations: No    Attends Engineer, structural: Never    Marital Status: Never married     Tobacco Counseling Counseling given: Not Answered    Clinical Intake:  Pre-visit preparation completed: Yes  Pain : No/denies pain     Nutritional Risks: Nausea/ vomitting/ diarrhea (vomiting with feeling food getting stuck, has a gastroenterologist) Diabetes: Yes CBG done?: No Did pt. bring in CBG monitor from home?: No  Lab Results  Component Value Date   HGBA1C 6.4 08/22/2023   HGBA1C 6.5 04/26/2023   HGBA1C 6.4 12/21/2022     How often do you need to have someone help you when you read instructions, pamphlets, or other written materials from your doctor or pharmacy?: 1 - Never  Interpreter Needed?: No  Information entered by :: NAllen LPN   Activities of Daily Living     01/22/2024    9:23 AM  In your present state of health, do you have any difficulty performing the following activities:  Hearing? 0  Vision? 0  Difficulty concentrating or making decisions? 0  Walking or climbing stairs? 1  Dressing or bathing? 0  Doing errands, shopping? 0  Preparing Food and eating ? N  Using the Toilet? N  In the past six months, have you accidently leaked urine? N  Do you have problems with loss of bowel control? N  Managing your Medications? N  Managing your Finances? N  Housekeeping or managing your Housekeeping? N    Patient Care Team: Nche, Bonna Gains, NP as PCP - General (Internal Medicine) [place], Heather, OD (Optometry)  Indicate any recent Medical Services you may have received from other than [facility] providers in the past year (date may be approximate).     Assessment:   This is a routine wellness examination for Sandra Curtis.  Hearing/Vision screen Hearing Screening - Comments:: Denies hearing issues Vision Screening - Comments:: Regular eye exam, [facility]   Goals Addressed             This Visit's Progress    Patient Stated       01/22/2024, does not want to get any worse health wise       Depression Screen     01/22/2024    9:32  AM 11/08/2023   10:28 AM 09/22/2023    2:01 PM 08/18/2023    1:19 PM 04/26/2023   10:27 AM 01/13/2023    9:22 AM 09/21/2022    1:43 PM  PHQ 2/9 Scores  PHQ - 2 Score 0 0 0 0 0 0 1  PHQ- 9 Score 10      6    Fall Risk     01/22/2024    9:31 AM 11/08/2023   10:28 AM 09/22/2023    2:01 PM 08/18/2023    1:19 PM 12/21/2022    1:00 PM  Fall Risk   Falls in the past year? 0 0 0 0 0  Number falls in past yr: 0 0 0 0 0  Injury with Fall? 0  0 0 0  Risk for fall due to : Medication side effect No Fall Risks No Fall Risks No Fall Risks No Fall Risks  Follow up Falls prevention discussed;Falls evaluation completed Falls evaluation completed Falls evaluation completed Falls evaluation completed Falls evaluation completed    MEDICARE RISK AT HOME:  Medicare Risk at Home Any stairs in or around the home?: No If so, are there any without handrails?: No Home free of loose throw rugs in walkways, pet beds, electrical cords, etc?: Yes Adequate lighting in your home to reduce risk of falls?: Yes Life alert?: No Use of a cane, walker or w/c?: No Grab bars in the bathroom?: Yes Shower chair or bench in shower?: No Elevated toilet seat or a handicapped toilet?: Yes  TIMED UP AND GO:  Was the test performed?  No  Cognitive Function: 6CIT completed    01/13/2023   10:24 AM  MMSE - Mini Mental State Exam  Orientation to time 4  Orientation to Place 5  Registration 3  Attention/ Calculation 5  Recall 3  Language- name 2 objects 1  Language- repeat 1  Language- follow 3 step command 3  Language- read & follow direction 1  Write a  sentence 1  Copy design 1  Total score 28        01/22/2024    9:35 AM  6CIT Screen  What Year? 0 points  What month? 0 points  What time? 0 points  Count back from 20 0 points  Months in reverse 0 points  Repeat phrase 0 points  Total Score 0 points    Immunizations Immunization History  Administered Date(s) Administered   Fluad Quad(high Dose 65+)  08/17/2022   Janssen (J&J) SARS-COV-2 Vaccination 01/18/2020   PNEUMOCOCCAL CONJUGATE-20 09/21/2022   Pneumococcal Conjugate-13 04/30/2021   Tdap 12/08/2017    Screening Tests Health Maintenance  Topic Date Due   Zoster Vaccines- Shingrix (1 of 2) Never done   COVID-19 Vaccine (2 - 2024-25 season) 06/11/2023   OPHTHALMOLOGY EXAM  09/23/2023   Colonoscopy  01/04/2024   HEMOGLOBIN A1C  02/19/2024   Diabetic kidney evaluation - eGFR measurement  04/25/2024   INFLUENZA VACCINE  05/10/2024   Diabetic kidney evaluation - Urine ACR  08/24/2024   FOOT EXAM  09/21/2024   Medicare Annual Wellness (AWV)  01/21/2025   MAMMOGRAM  02/02/2025   DTaP/Tdap/Td (2 - Td or Tdap) 12/09/2027   Pneumonia Vaccine 40+ Years old  Completed   DEXA SCAN  Completed   Hepatitis C Screening  Completed   HPV VACCINES  Aged Out   Meningococcal B Vaccine  Aged Out    Health Maintenance  Health Maintenance Due  Topic Date Due   Zoster Vaccines- Shingrix (1 of 2) Never done   COVID-19 Vaccine (2 - 2024-25 season) 06/11/2023   OPHTHALMOLOGY EXAM  09/23/2023   Colonoscopy  01/04/2024   Health Maintenance Items Addressed: Spoke to gastroenterology about getting colonoscopy scheduled  Additional Screening:  Vision Screening: Recommended annual ophthalmology exams for early detection of glaucoma and other disorders of the eye.  Dental Screening: Recommended annual dental exams for proper oral hygiene  Community Resource Referral / Chronic Care Management: CRR required this visit?  No   CCM required this visit?  No     Plan:     I have personally reviewed and noted the following in the patient's chart:   Medical and social history Use of alcohol, tobacco or illicit drugs  Current medications and supplements including opioid prescriptions. Patient is not currently taking opioid prescriptions. Functional ability and status Nutritional status Physical activity Advanced directives List of other  physicians Hospitalizations, surgeries, and ER visits in previous 12 months Vitals Screenings to include cognitive, depression, and falls Referrals and appointments  In addition, I have reviewed and discussed with patient certain preventive protocols, quality metrics, and best practice recommendations. A written personalized care plan for preventive services as well as general preventive health recommendations were provided to patient.     Areatha Beecham, LPN   [id number]   After Visit Summary: (MyChart) Due to this being a telephonic visit, the after visit summary with patients personalized plan was offered to patient via MyChart   Notes: Nothing significant to report at this time.

## 2024-01-25 ENCOUNTER — Telehealth: Payer: Self-pay | Admitting: Gastroenterology

## 2024-01-25 NOTE — Telephone Encounter (Signed)
 Pt has hiatal hernia that has gotten larger. She reports her throat is burning more, food feels like it is sticking more in her throat and she is not really eating that much. She has SOB but states she also has a lung dx. She is taking protonix 40mg  bid and pepcid 40mg  at bedtime. Pt wonders if there is something else she might be able to take. Please advise.

## 2024-01-25 NOTE — Telephone Encounter (Signed)
 Requesting to speak with a nurse in regards to vomiting , shortness of breath and also burning  in her throat.   Please advise.

## 2024-01-25 NOTE — Telephone Encounter (Signed)
 Sandra Curtis, this patient belongs to Dr. Rosaline Coma, pls forward to POD B APPs.  I am Hospital APP today and Dr. Rosaline Coma is hospital physician today. Pls send back to me if her APPs are not in office today to address.

## 2024-01-29 ENCOUNTER — Other Ambulatory Visit: Payer: Self-pay

## 2024-01-29 DIAGNOSIS — R1310 Dysphagia, unspecified: Secondary | ICD-10-CM

## 2024-01-29 DIAGNOSIS — K219 Gastro-esophageal reflux disease without esophagitis: Secondary | ICD-10-CM

## 2024-01-29 MED ORDER — SUCRALFATE 1 G PO TABS: 1.0000 g | ORAL_TABLET | Freq: Three times a day (TID) | ORAL | 0 refills | Status: DC

## 2024-01-29 NOTE — Telephone Encounter (Signed)
 Prescription sent to pharmacy.  Rad scheduling to contact pt regarding barium swallow. Pt notified via mychart.

## 2024-01-30 ENCOUNTER — Ambulatory Visit: Payer: No Typology Code available for payment source | Admitting: Pulmonary Disease

## 2024-01-30 DIAGNOSIS — J84112 Idiopathic pulmonary fibrosis: Secondary | ICD-10-CM

## 2024-01-30 LAB — PULMONARY FUNCTION TEST
DL/VA % pred: 74 %
DL/VA: 3.21 ml/min/mmHg/L
DLCO unc % pred: 47 %
DLCO unc: 8.08 ml/min/mmHg
FEF 25-75 Pre: 1.85 L/s
FEF2575-%Pred-Pre: 102 %
FEV1-%Pred-Pre: 71 %
FEV1-Pre: 1.41 L
FEV1FVC-%Pred-Pre: 111 %
FEV6-%Pred-Pre: 66 %
FEV6-Pre: 1.66 L
FEV6FVC-%Pred-Pre: 104 %
FVC-%Pred-Pre: 63 %
FVC-Pre: 1.66 L
Pre FEV1/FVC ratio: 85 %
Pre FEV6/FVC Ratio: 100 %
RV % pred: 67 %
RV: 1.31 L
TLC % pred: 68 %
TLC: 3.04 L

## 2024-01-30 NOTE — Progress Notes (Signed)
 Spiro with graph, Pleth and DLCO performed today

## 2024-01-30 NOTE — Patient Instructions (Signed)
 Spiro with graph, Pleth and DLCO performed today

## 2024-01-30 NOTE — Telephone Encounter (Signed)
 Pt requesting refill on Lyrica . LOV 09/22/23. OV notes states "D/C Lyrica ." FU overdue since 10/27/23. Pt needs an appt. Pt advised in [facility] message to call or schedule on [facility].

## 2024-01-31 ENCOUNTER — Ambulatory Visit: Payer: No Typology Code available for payment source | Admitting: Pulmonary Disease

## 2024-01-31 ENCOUNTER — Encounter: Payer: Self-pay | Admitting: Pulmonary Disease

## 2024-01-31 VITALS — BP 120/62 | HR 93 | Temp 99.1°F | Ht 60.0 in | Wt 137.2 lb

## 2024-01-31 DIAGNOSIS — Z5181 Encounter for therapeutic drug level monitoring: Secondary | ICD-10-CM

## 2024-01-31 DIAGNOSIS — Z87891 Personal history of nicotine dependence: Secondary | ICD-10-CM | POA: Diagnosis not present

## 2024-01-31 DIAGNOSIS — K219 Gastro-esophageal reflux disease without esophagitis: Secondary | ICD-10-CM

## 2024-01-31 DIAGNOSIS — R1310 Dysphagia, unspecified: Secondary | ICD-10-CM | POA: Diagnosis not present

## 2024-01-31 DIAGNOSIS — J84112 Idiopathic pulmonary fibrosis: Secondary | ICD-10-CM

## 2024-01-31 DIAGNOSIS — N189 Chronic kidney disease, unspecified: Secondary | ICD-10-CM | POA: Diagnosis not present

## 2024-01-31 LAB — COMPREHENSIVE METABOLIC PANEL WITH GFR
ALT: 14 U/L (ref 0–35)
AST: 27 U/L (ref 0–37)
Albumin: 4.6 g/dL (ref 3.5–5.2)
Alkaline Phosphatase: 61 U/L (ref 39–117)
BUN: 17 mg/dL (ref 6–23)
CO2: 28 meq/L (ref 19–32)
Calcium: 10.4 mg/dL (ref 8.4–10.5)
Chloride: 102 meq/L (ref 96–112)
Creatinine, Ser: 1.33 mg/dL — ABNORMAL HIGH (ref 0.40–1.20)
GFR: 41.3 mL/min — ABNORMAL LOW (ref >60.00)
Glucose, Bld: 89 mg/dL (ref 70–99)
Potassium: 4.4 meq/L (ref 3.5–5.1)
Sodium: 136 meq/L (ref 135–145)
Total Bilirubin: 0.4 mg/dL (ref 0.2–1.2)
Total Protein: 8.3 g/dL (ref 6.0–8.3)

## 2024-01-31 NOTE — Patient Instructions (Signed)
 History of Present Illness Sandra Curtis is a 68 year old female with idiopathic pulmonary fibrosis who presents for follow-up of her pulmonary condition.  She experiences persistent shortness of breath despite treatment with Ofev  100 mg twice daily for idiopathic pulmonary fibrosis. There was a temporary interruption in her medication due to a pharmacy issue, but she has resumed taking it. She has not experienced significant gastrointestinal symptoms such as diarrhea with the current dose. She notes a reduction in coughing and is now able to lie on her right side without coughing or choking, which she could not do previously.  She has a history of esophagitis and is experiencing dysphagia and acid reflux, leading to vomiting and difficulty eating. Her gastroenterologist has changed her medication to sucralfate  to manage these symptoms. She is also on Protonix  twice a week. She describes a burning sensation due to reflux and notes that her food often comes back up after eating.  She has a history of smoking but quit five years ago. She is currently using albuterol  as needed and has not been using any other inhalers as she does not have COPD or asthma.  She has chronic kidney disease and has not seen her nephrologist this year.  Past Medical History - Pulmonary fibrosis - Esophagitis - Chronic kidney disease  Medications - Ofev  (100 mg twice daily) - Protonix  (twice a week) - Sucralfate  - Albuterol  inhaler (as needed)  Social History - Patient smoked in the past, quit 5 years ago  Physical Exam CHEST: Crackles heard on lung auscultation.  Results DIAGNOSTIC Pulmonary function test: Mild reduction in lung capacity  Assessment and Plan Idiopathic Pulmonary Fibrosis Idiopathic pulmonary fibrosis with well-managed symptoms on Ofev  100 mg twice daily. Initial medication tolerance issues led to a lower starting dose. Lung function tests indicate slight reduction in capacity,  consistent with disease progression. Reports reduced coughing and improved ability to lay on right side. No significant gastrointestinal symptoms with current dose. - Increase Ofev  to 150 mg twice daily - Order labs today - Order chest CT in six months  Gastroesophageal Reflux Disease with Esophagitis Gastroesophageal reflux disease with esophagitis causing significant symptoms, including burning sensation and difficulty eating. Managed with Protonix  and sucralfate . Potential for reflux to exacerbate lung condition via aspiration. Coordinating management with GI specialist, Dr. Rosaline Coma.  Dysphagia Dysphagia associated with gastroesophageal reflux disease, presenting as food regurgitation and esophageal discomfort. Managed by GI specialist, Dr. Rosaline Coma.  Chronic Kidney Disease, unspecified Chronic kidney disease, unspecified. She has not consulted a nephrologist this year. Labs will be conducted to assess kidney function. - Check kidney function in labs today

## 2024-01-31 NOTE — Progress Notes (Signed)
 Sandra Curtis    [id number]    1956/02/19  Primary Care Physician:Nche, Connye Delaine, NP  Referring Physician: Kandace Organ, NP [address] [place],  [place] [address]  Chief complaint: Follow-up for IPF Did not tolerate Esbriet  Started Ofev  April 2022  HPI: 68 y.o.  with history of allergies, GERD, esophageal stricture Complains of chronic cough for several years, worsening dyspnea on exertion. She had a CT scan done by Dr. Ethelyn Herbert, primary care showing pulmonary fibrosis and has been referred for further evaluation.  Followed by Dr. Sandrea Cruel, GI with EGD in 2022 showing esophageal stricture secondary to acid reflux.   Has also seen Dr. Rodell Citrin, rheumatology for evaluation of elevated ANA and SSA.  He feels she does not have any signs of autoimmune disease.  Discussed at multidisciplinary conference in December 2021 with diagnosis of IPF Started Esbriet  in December 2021.  But did not tolerate even at a low dose due to dizziness and headache. Antifibrotic switched to Ofev  in April 2022 She is tolerating this better  She has a new diagnosis of CKD and is now followed by Dr. 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  No mold, hot tub, Jacuzzi.  No feather pillows or comforters Smoking history: 18-pack-year smoker.  Quit 1 month ago Travel history: No significant travel history Relevant family history: No significant family issue of lung disease  Interval history: Discussed the use of AI scribe software for clinical note transcription with the patient, who gave verbal consent to proceed.  History of Present Illness [name] is a 68 year old female with idiopathic pulmonary fibrosis who presents for follow-up of her pulmonary condition.  She experiences persistent shortness of  breath despite treatment with Ofev  100 mg twice daily for idiopathic pulmonary fibrosis. There was a temporary interruption in her medication due to a pharmacy issue, but she has resumed taking it. She has not experienced significant gastrointestinal symptoms such as diarrhea with the current dose. She notes a reduction in coughing and is now able to lie on her right side without coughing or choking, which she could not do previously.  She has a history of esophagitis and is experiencing dysphagia and acid reflux, leading to vomiting and difficulty eating. Her gastroenterologist has changed her medication to sucralfate  to manage these symptoms. She is also on Protonix  twice a week. She describes a burning sensation due to reflux and notes that her food often comes back up after eating.  She has a history of smoking but quit five years ago. She is currently using albuterol  as needed and has not been using any other inhalers as she does not have COPD or asthma.  She has chronic kidney disease and has not seen her nephrologist this year.    Outpatient Encounter Medications as of 01/31/2024  Medication Sig   acetaminophen  (TYLENOL ) 650 MG CR tablet Take 650 mg by mouth every 8 (eight) hours as needed for pain.   albuterol  (VENTOLIN  HFA) 108 (90 Base) MCG/ACT inhaler INHALE 1 TO 2 PUFFS INTO THE LUNGS EVERY 6 HOURS AS NEEDED   atorvastatin  (LIPITOR) 40 MG tablet Take 1 tablet (40 mg total) by mouth 3 (three) times a week.   famotidine  (PEPCID ) 40 MG tablet TAKE 1 TABLET(40 MG) BY MOUTH AT BEDTIME   Nintedanib (OFEV ) 100 MG CAPS  TAKE 1 CAPSULE BY MOUTH TWICE A  DAY 12 HOURS APART WITH FOOD   pantoprazole  (PROTONIX ) 40 MG tablet Take 1 tablet (40 mg total) by mouth 2 (two) times daily.   pregabalin  (LYRICA ) 50 MG capsule Take 1 capsule (50 mg total) by mouth 2 (two) times daily.   promethazine -dextromethorphan (PROMETHAZINE -DM) 6.25-15 MG/5ML syrup Take 5 mLs by mouth 3 (three) times daily as needed for  cough.   sucralfate  (CARAFATE ) 1 g tablet Take 1 tablet (1 g total) by mouth 4 (four) times daily -  with meals and at bedtime.   Vitamin D , Ergocalciferol , (DRISDOL ) 1.25 MG (50000 UNIT) CAPS capsule Take 1 capsule (50,000 Units total) by mouth every 7 (seven) days.   [DISCONTINUED] fluticasone  (FLONASE ) 50 MCG/ACT nasal spray Place 2 sprays into both nostrils daily. (Patient not taking: Reported on 01/31/2024)   No facility-administered encounter medications on file as of 01/31/2024.   Physical Exam: Blood pressure 120/62, pulse 93, temperature 99.1 F (37.3 C), temperature source Oral, height 5' (1.524 m), weight 137 lb 3.2 oz (62.2 kg), SpO2 93%. Gen:      No acute distress HEENT:  EOMI, sclera anicteric Neck:     No masses; no thyromegaly Lungs:    Bibasal crackles CV:         Regular rate and rhythm; no murmurs Abd:      + bowel sounds; soft, non-tender; no palpable masses, no distension Ext:    No edema; adequate peripheral perfusion Skin:      Warm and dry; no rash Neuro: alert and oriented x 3 Psych: normal mood and affect   Data Reviewed: Imaging: Chest x-ray 05/15/2020-diffuse interstitial opacities CT chest 06/17/2020-reticulation with traction bronchiectasis, honeycombing most prominent in the right lung base.  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 High-resolution CT 11/02/2021-stable findings of UIP pattern pulmonary fibrosis High resolution CT 07/16/2022-stable findings of UIP pattern pulmonary fibrosis. High resolution CT 08/02/2023-radiology read is pending.  By my read UIP pattern of pulmonary fibrosis appears stable. I have reviewed the images personally.  PFTs: 09/09/2020 FVC 1.80 [85%], FEV1 1.55 [94%], F/F 86, TLC 3.05 [60%], DLCO 10.61 [61%] Mild-moderate restriction and diffusion defect  08/17/2022 FVC 1.74 [65%], FEV1 1.50 [74%], F/F 86, TLC 3.70 [83%], DLCO 9.83 [57%]  01/30/2024 FVC 1.66 [63%], FEV1 1.41 [100%], F/F85, TLC 3.04 [68%], DLCO 8.28 [47%]   Labs: ANA  06/27/2019 1:40, cytoplasmic, rheumatoid factor less than 14 ANA 07/16/2020-1:40, SSA greater than 8  CMP 12/28/21- WNL  Cardiac: Echocardiogram 04/17/2023-LVEF 60 to 65%, grade 1 diastolic dysfunction, normal RV systolic size and function.  PA pressure is normal.  Assessment & Plan Idiopathic Pulmonary Fibrosis CT with UIP pattern fibrosis. She does have mild arthritis symptoms which may be secondary to osteoarthritis but does endorse some morning stiffness and small joint pain.  She had some occupational exposures to textile fiber and dust but no asbestos. Her exposures are not a cause of the pulmonary finrosis No evidence of autoimmune process, connective tissue disease per rheumatology evaluation.  Case discussed at multidisciplinary conference in dec 2021 and diagnosis felt to be consistent with IPF Unfortunately she has not tolerated Esbriet  even at a low dose of 1 tablet 3 times a day and is now on Ofev  at 100 mg twice daily. Finished pulm rehab.  No evidence of pulmonary hypertension on echocardiogram We discussed referral to lung transplantation but she would like to hold off for now  Well-managed symptoms on Ofev  100 mg twice daily. Initial medication tolerance  issues led to a lower starting dose. Lung function tests indicate slight reduction in capacity, consistent with disease progression. Reports reduced coughing and improved ability to lay on right side. No significant gastrointestinal symptoms with current dose.  - Increase Ofev  to 150 mg twice daily - Order labs today for therapeutic monitoring - Order chest CT in six months  Gastroesophageal Reflux Disease with Esophagitis Gastroesophageal reflux disease with esophagitis causing significant symptoms, including burning sensation and difficulty eating. Managed with Protonix  and sucralfate . Potential for reflux to exacerbate lung condition via aspiration. Coordinating management with GI specialist, Dr.  Rosaline Coma.  Dysphagia Dysphagia associated with gastroesophageal reflux disease, presenting as food regurgitation and esophageal discomfort. Managed by GI specialist, Dr. Rosaline Coma.  Chronic Kidney Disease, unspecified Chronic kidney disease, unspecified. She has not consulted a nephrologist this year. Labs will be conducted to assess kidney function. - Check kidney function in labs today   Plan/Recommendations: Check with pharmacy about increasing Ofev  to 150 twice daily Labs for monitoring Order follow-up CT scan in 6 months Tessalon  for cough  Phyllis Breeze MD [facility] 01/31/2024, 3:18 PM  CC: Nche, Connye Delaine, NP

## 2024-02-03 ENCOUNTER — Encounter: Payer: Self-pay | Admitting: Pharmacist

## 2024-02-03 MED ORDER — OFEV 150 MG PO CAPS: 150.0000 mg | ORAL_CAPSULE | Freq: Two times a day (BID) | ORAL | 1 refills | Status: DC

## 2024-02-03 NOTE — Addendum Note (Signed)
 Addended by: Thais Fill on: 02/03/2024 12:00 PM   Modules accepted: Orders

## 2024-02-03 NOTE — Progress Notes (Signed)
 Ofev  150mg  twice daily sent to [facility].  Geraldene Kleine, PharmD, MPH, BCPS, CPP Clinical Pharmacist (Rheumatology and Pulmonology)

## 2024-02-05 ENCOUNTER — Ambulatory Visit (INDEPENDENT_AMBULATORY_CARE_PROVIDER_SITE_OTHER): Admitting: Gastroenterology

## 2024-02-05 ENCOUNTER — Encounter: Payer: Self-pay | Admitting: Gastroenterology

## 2024-02-05 VITALS — BP 122/64 | HR 90 | Ht 60.0 in | Wt 135.4 lb

## 2024-02-05 DIAGNOSIS — R1310 Dysphagia, unspecified: Secondary | ICD-10-CM

## 2024-02-05 DIAGNOSIS — K219 Gastro-esophageal reflux disease without esophagitis: Secondary | ICD-10-CM | POA: Diagnosis not present

## 2024-02-05 DIAGNOSIS — K449 Diaphragmatic hernia without obstruction or gangrene: Secondary | ICD-10-CM

## 2024-02-05 MED ORDER — PANTOPRAZOLE SODIUM 40 MG PO TBEC
40.0000 mg | DELAYED_RELEASE_TABLET | Freq: Two times a day (BID) | ORAL | 3 refills | Status: DC
Start: 2024-02-05 — End: 2024-07-15

## 2024-02-05 MED ORDER — SUCRALFATE 1 G PO TABS: 1.0000 g | ORAL_TABLET | Freq: Four times a day (QID) | ORAL | 2 refills | Status: DC

## 2024-02-05 NOTE — Progress Notes (Signed)
 Chief Complaint: GERD, dysphagia, hiatal hernia follow-up Primary GI MD: Dr. Rosaline Coma  HPI: Discussed the use of AI scribe software for clinical note transcription with the patient, who gave verbal consent to proceed.  History of Present Illness Sandra Curtis is a 68 year old female with GERD and a history of esophageal dilation who presents with recurrent dysphagia and sore throat.  She experiences recurrent dysphagia and sore throat, with difficulty swallowing and a sensation of food getting stuck. The sore throat began about a week ago and worsens with eating, leading her to stop consuming solid foods since Friday. She has been using Ensure as a nutritional substitute, which she tolerates well.  She reports nausea and vomiting, particularly after eating, which prompted her to avoid solid foods.  She also experiences regurgitation when she lies flat. liquids do not cause obstruction, and she can drink water without vomiting. She also experiences heartburn and upper stabbing pain, described as a burning sensation in her throat.  Her current medication regimen includes pantoprazole  twice daily, famotidine  once daily, and Carafate  four times daily. She started Carafate  on Thursday (3 days ago), taking it at 9 AM, 4 PM, and 10 PM, but she is not consistently taking pantoprazole . She notes some improvement with Carafate , although symptoms can return within an hour.  Her past medical history includes an endoscopy in November 2024, during which her esophagus was dilated to 17 millimeters and a benign polyp was removed from her duodenum. She has a tortuous esophagus contributing to her swallowing difficulties and a medium-sized hiatal hernia exacerbating her reflux symptoms.    PREVIOUS GI WORKUP   EGD 08/24/2023 for worsening GERD and dysphagia  - Mildly tortuous esophagus.  - Benign- appearing esophageal stenosis. Dilated. - Medium- sized hiatal hernia.  - Gastroesophageal flap valve  classified as Hill Grade IV ( no fold, wide open lumen, hiatal hernia present) .  - A single duodenal polyp. Resected and retrieved.   EGD Sept 2023 - Benign-appearing esophageal stenosis. Dilated.  - Medium-sized hiatal hernia.  - Normal duodenal bulb and second portion of the duodenum.  - No specimens collected.   July 2024 echo shows EF=60=65%    Colonoscopy 12/2013 for heme positive stool - Normal colon - Small internal hemorrhoids - Repeat 12/2023  Past Medical History:  Diagnosis Date   Allergic rhinitis    Allergy    Chronic back pain 07/20/2018   Chronic midline low back pain 02/06/2017   Depression    Esophageal stricture    GERD (gastroesophageal reflux disease)    Inflammatory polyps of colon (HCC) 2010   Pulmonary filariasis (HCC)    Systolic murmur    UTI (urinary tract infection)     Past Surgical History:  Procedure Laterality Date   BREAST BIOPSY Left 01/22/2021   COLONOSCOPY     UPPER GASTROINTESTINAL ENDOSCOPY      Current Outpatient Medications  Medication Sig Dispense Refill   acetaminophen  (TYLENOL ) 650 MG CR tablet Take 650 mg by mouth every 8 (eight) hours as needed for pain.     albuterol  (VENTOLIN  HFA) 108 (90 Base) MCG/ACT inhaler INHALE 1 TO 2 PUFFS INTO THE LUNGS EVERY 6 HOURS AS NEEDED 6.7 g 0   atorvastatin  (LIPITOR) 40 MG tablet Take 1 tablet (40 mg total) by mouth 3 (three) times a week. 36 tablet 3   famotidine  (PEPCID ) 40 MG tablet TAKE 1 TABLET(40 MG) BY MOUTH AT BEDTIME 90 tablet 1   Nintedanib (OFEV ) 150 MG CAPS  Take 1 capsule (150 mg total) by mouth 2 (two) times daily. 180 capsule 1   pregabalin  (LYRICA ) 50 MG capsule Take 1 capsule (50 mg total) by mouth 2 (two) times daily. 180 capsule 1   promethazine -dextromethorphan (PROMETHAZINE -DM) 6.25-15 MG/5ML syrup Take 5 mLs by mouth 3 (three) times daily as needed for cough. 180 mL 0   Vitamin D , Ergocalciferol , (DRISDOL ) 1.25 MG (50000 UNIT) CAPS capsule Take 1 capsule (50,000 Units  total) by mouth every 7 (seven) days. 12 capsule 0   pantoprazole  (PROTONIX ) 40 MG tablet Take 1 tablet (40 mg total) by mouth 2 (two) times daily. 60 tablet 3   sucralfate  (CARAFATE ) 1 g tablet Take 1 tablet (1 g total) by mouth 4 (four) times daily. 120 tablet 2   No current facility-administered medications for this visit.    Allergies as of 02/05/2024 - Review Complete 02/05/2024  Allergen Reaction Noted   Iodine Hives 07/11/2012    Family History  Problem Relation Age of Onset   Arthritis Mother    Thyroid  disease Mother    Heart disease Father    Hypertension Father    Diabetes Father    Arthritis Sister    Diabetes Brother    Colon cancer Neg Hx    Stomach cancer Neg Hx    Rectal cancer Neg Hx    Esophageal cancer Neg Hx     Social History   Socioeconomic History   Marital status: Single    Spouse name: Not on file   Number of children: 1   Years of education: Not on file   Highest education level: 12th grade  Occupational History   Occupation: Biomedical engineer: paperworks  Tobacco Use   Smoking status: Former    Current packs/day: 0.00    Average packs/day: 0.5 packs/day for 35.0 years (17.5 ttl pk-yrs)    Types: Cigarettes    Start date: 04/13/1983    Quit date: 04/12/2018    Years since quitting: 5.8    Passive exposure: Never   Smokeless tobacco: Never  Vaping Use   Vaping status: Never Used  Substance and Sexual Activity   Alcohol use: Not Currently   Drug use: No   Sexual activity: Not Currently    Birth control/protection: Post-menopausal  Other Topics Concern   Not on file  Social History Narrative   Not on file   Social Drivers of Health   Financial Resource Strain: Low Risk  (01/22/2024)   Overall Financial Resource Strain (CARDIA)    Difficulty of Paying Living Expenses: Not hard at all  Food Insecurity: No Food Insecurity (01/22/2024)   Hunger Vital Sign    Worried About Running Out of Food in the Last Year: Never true    Ran Out  of Food in the Last Year: Never true  Transportation Needs: No Transportation Needs (01/22/2024)   PRAPARE - Administrator, Civil Service (Medical): No    Lack of Transportation (Non-Medical): No  Physical Activity: Insufficiently Active (01/22/2024)   Exercise Vital Sign    Days of Exercise per Week: 7 days    Minutes of Exercise per Session: 10 min  Stress: No Stress Concern Present (01/22/2024)   [employer] of Occupational Health - Occupational Stress Questionnaire    Feeling of Stress : Not at all  Social Connections: Socially Isolated (01/22/2024)   Social Connection and Isolation Panel [NHANES]    Frequency of Communication with Friends and Family: More than three times a  week    Frequency of Social Gatherings with Friends and Family: Once a week    Attends Religious Services: Never    Database administrator or Organizations: No    Attends Banker Meetings: Never    Marital Status: Never married  Intimate Partner Violence: Not At Risk (01/22/2024)   Humiliation, Afraid, Rape, and Kick questionnaire    Fear of Current or Ex-Partner: No    Emotionally Abused: No    Physically Abused: No    Sexually Abused: No    Review of Systems:    Constitutional: No weight loss, fever, chills, weakness or fatigue HEENT: Eyes: No change in vision               Ears, Nose, Throat:  No change in hearing or congestion Skin: No rash or itching Cardiovascular: No chest pain, chest pressure or palpitations   Respiratory: No SOB or cough Gastrointestinal: See HPI and otherwise negative Genitourinary: No dysuria or change in urinary frequency Neurological: No headache, dizziness or syncope Musculoskeletal: No new muscle or joint pain Hematologic: No bleeding or bruising Psychiatric: No history of depression or anxiety    Physical Exam:  Vital signs: BP 122/64   Pulse 90   Ht 5' (1.524 m)   Wt 135 lb 6 oz (61.4 kg)   BMI 26.44 kg/m   Constitutional: NAD,  alert and cooperative Head:  Normocephalic and atraumatic. Eyes:   PEERL, EOMI. No icterus. Conjunctiva pink. Respiratory: Respirations even and unlabored. Lungs clear to auscultation bilaterally.   No wheezes, crackles, or rhonchi.  Cardiovascular:  Regular rate and rhythm. No peripheral edema, cyanosis or pallor.  Gastrointestinal:  Soft, nondistended, nontender. No rebound or guarding. Normal bowel sounds. No appreciable masses or hepatomegaly. Rectal:  Declines Msk:  Symmetrical without gross deformities. Without edema, no deformity or joint abnormality.  Neurologic:  Alert and  oriented x4;  grossly normal neurologically.  Skin:   Dry and intact without significant lesions or rashes. Psychiatric: Oriented to person, place and time. Demonstrates good judgement and reason without abnormal affect or behaviors.  RELEVANT LABS AND IMAGING: CBC    Component Value Date/Time   WBC 5.4 08/22/2023 1042   RBC 3.43 (L) 08/22/2023 1042   HGB 10.5 (L) 08/22/2023 1042   HCT 31.9 (L) 08/22/2023 1042   PLT 235.0 08/22/2023 1042   MCV 92.9 08/22/2023 1042   MCHC 32.9 08/22/2023 1042   RDW 14.7 08/22/2023 1042   LYMPHSABS 2.9 08/22/2023 1042   MONOABS 0.6 08/22/2023 1042   EOSABS 0.2 08/22/2023 1042   BASOSABS 0.0 08/22/2023 1042    CMP     Component Value Date/Time   NA 136 01/31/2024 1544   K 4.4 01/31/2024 1544   CL 102 01/31/2024 1544   CO2 28 01/31/2024 1544   GLUCOSE 89 01/31/2024 1544   BUN 17 01/31/2024 1544   CREATININE 1.33 (H) 01/31/2024 1544   CALCIUM  10.4 01/31/2024 1544   PROT 8.3 01/31/2024 1544   ALBUMIN 4.6 01/31/2024 1544   AST 27 01/31/2024 1544   ALT 14 01/31/2024 1544   ALKPHOS 61 01/31/2024 1544   BILITOT 0.4 01/31/2024 1544     Assessment/Plan:   GERD Dysphagia Hiatal Hernia Tortuous Esophagus History of medium hiatal hernia and esophageal strictures s/p dilation with the most recent EGD November 2024 resulting in 1 month improvement of her symptoms.   Dilation to 17 mm. Put on pantoprazole  40mg  BID 12/11/23 and given Carafate  recently for break through symptoms.  Has not been consistent with her pantoprazole  but notes some improvement with Carafate .  Still struggling with GERD and regurgitation especially when lying flat as well as dysphagia limited to solids.  Suspect her other hernia is contributing. - Continue pantoprazole  40 Mg twice daily - Continue Carafate  4 times daily - Continue famotidine  40 Mg at bedtime - Educated patient on lifestyle modifications and provided patient education handouts - EGD with dilation for further evaluation - I thoroughly discussed the procedure with the patient (at bedside) to include nature of the procedure, alternatives, benefits, and risks (including but not limited to bleeding, infection, perforation, anesthesia/cardiac pulmonary complications).  Patient verbalized understanding and gave verbal consent to proceed with procedure.    Colon cancer screening Last colonoscopy in 2015, normal, repeat 12/2023.  Patient not interested in colonoscopy at this time.    Gigi Kyle [facility] 02/05/2024, 3:42 PM  Cc: Kandace Organ, NP

## 2024-02-05 NOTE — Patient Instructions (Addendum)
 We have sent the following medications to your pharmacy for you to pick up at your convenience: Pantoprazole  40 mg twice daily and Carafate  1 g 4 times daily   You have been scheduled for an endoscopy. Please follow written instructions given to you at your visit today.  If you use inhalers (even only as needed), please bring them with you on the day of your procedure.  If you take any of the following medications, they will need to be adjusted prior to your procedure:   DO NOT TAKE 7 DAYS PRIOR TO TEST- Trulicity (dulaglutide) Ozempic, Wegovy (semaglutide) Mounjaro (tirzepatide) Bydureon Bcise (exanatide extended release)  DO NOT TAKE 1 DAY PRIOR TO YOUR TEST Rybelsus (semaglutide) Adlyxin (lixisenatide) Victoza (liraglutide) Byetta (exanatide) ___________________________________________________________________________  Please follow up sooner if symptoms increase or worsen  Due to recent changes in healthcare laws, you may see the results of your imaging and laboratory studies on MyChart before your provider has had a chance to review them.  We understand that in some cases there may be results that are confusing or concerning to you. Not all laboratory results come back in the same time frame and the provider may be waiting for multiple results in order to interpret others.  Please give us  48 hours in order for your provider to thoroughly review all the results before contacting the office for clarification of your results.   _______________________________________________________  If your blood pressure at your visit was 140/90 or greater, please contact your primary care physician to follow up on this.  _______________________________________________________  If you are age 68 or older, your body mass index should be between 23-30. Your Body mass index is 26.44 kg/m. If this is out of the aforementioned range listed, please consider follow up with your Primary Care  Provider.  If you are age 68 or younger, your body mass index should be between 19-25. Your Body mass index is 26.44 kg/m. If this is out of the aformentioned range listed, please consider follow up with your Primary Care Provider.   ________________________________________________________  The [facility] providers would like to encourage you to use MYCHART to communicate with providers for non-urgent requests or questions.  Due to long hold times on the telephone, sending your provider a message by [facility] may be a faster and more efficient way to get a response.  Please allow 48 business hours for a response.  Please remember that this is for non-urgent requests.  _______________________________________________________ Thank you for trusting me with your gastrointestinal care!   Suzanna Erp, PA

## 2024-02-05 NOTE — Progress Notes (Signed)
 I agree with the assessment and plan as outlined by Ms. McMichael.

## 2024-02-06 ENCOUNTER — Encounter: Payer: Self-pay | Admitting: Certified Registered Nurse Anesthetist

## 2024-02-07 ENCOUNTER — Ambulatory Visit: Admitting: Internal Medicine

## 2024-02-07 ENCOUNTER — Encounter: Payer: Self-pay | Admitting: Internal Medicine

## 2024-02-07 VITALS — BP 112/67 | HR 86 | Temp 97.9°F | Resp 21 | Ht 60.0 in | Wt 135.0 lb

## 2024-02-07 DIAGNOSIS — D132 Benign neoplasm of duodenum: Secondary | ICD-10-CM

## 2024-02-07 DIAGNOSIS — K222 Esophageal obstruction: Secondary | ICD-10-CM

## 2024-02-07 DIAGNOSIS — K219 Gastro-esophageal reflux disease without esophagitis: Secondary | ICD-10-CM | POA: Diagnosis not present

## 2024-02-07 DIAGNOSIS — R1310 Dysphagia, unspecified: Secondary | ICD-10-CM

## 2024-02-07 DIAGNOSIS — K2100 Gastro-esophageal reflux disease with esophagitis, without bleeding: Secondary | ICD-10-CM | POA: Diagnosis not present

## 2024-02-07 DIAGNOSIS — K449 Diaphragmatic hernia without obstruction or gangrene: Secondary | ICD-10-CM | POA: Diagnosis not present

## 2024-02-07 DIAGNOSIS — F32A Depression, unspecified: Secondary | ICD-10-CM | POA: Diagnosis not present

## 2024-02-07 DIAGNOSIS — K2210 Ulcer of esophagus without bleeding: Secondary | ICD-10-CM | POA: Diagnosis not present

## 2024-02-07 MED ORDER — SODIUM CHLORIDE 0.9 % IV SOLN: 500.0000 mL | Freq: Once | INTRAVENOUS | Status: DC

## 2024-02-07 NOTE — Progress Notes (Signed)
 1419  Pt experienced laryngeal spasm with jaw thrust and PPV performed. Nasopharyngeal airway size 6.0  placed without trauma, vss

## 2024-02-07 NOTE — Progress Notes (Signed)
1427 Ephedrine 10 mg given IV due to low BP, MD updated.

## 2024-02-07 NOTE — Progress Notes (Signed)
 GASTROENTEROLOGY PROCEDURE H&P NOTE   Primary Care Physician: Kandace Organ, NP    Reason for Procedure:   GERD, dysphagia, history of duodenal adenoma  Plan:    EGD  Patient is appropriate for endoscopic procedure(s) in the ambulatory ([facility]) setting.  The nature of the procedure, as well as the risks, benefits, and alternatives were carefully and thoroughly reviewed with the patient. Ample time for discussion and questions allowed. The patient understood, was satisfied, and agreed to proceed.     HPI: Sandra Curtis is a 68 y.o. female who presents for EGD for evaluation of GERD, dysphagia, history of duodenal adenoma.  Patient was most recently seen in the Gastroenterology Clinic on 02/05/24.  No interval change in medical history since that appointment. Please refer to that note for full details regarding GI history and clinical presentation.   Past Medical History:  Diagnosis Date   Allergic rhinitis    Allergy    Chronic back pain 07/20/2018   Chronic midline low back pain 02/06/2017   Depression    Esophageal stricture    GERD (gastroesophageal reflux disease)    Inflammatory polyps of colon (HCC) 2010   Pulmonary filariasis (HCC)    Systolic murmur    UTI (urinary tract infection)     Past Surgical History:  Procedure Laterality Date   BREAST BIOPSY Left 01/22/2021   COLONOSCOPY     UPPER GASTROINTESTINAL ENDOSCOPY      Prior to Admission medications   Medication Sig Start Date End Date Taking? Authorizing Provider  acetaminophen  (TYLENOL ) 650 MG CR tablet Take 650 mg by mouth every 8 (eight) hours as needed for pain.    [provider]  albuterol  (VENTOLIN  HFA) 108 (90 Base) MCG/ACT inhaler INHALE 1 TO 2 PUFFS INTO THE LUNGS EVERY 6 HOURS AS NEEDED 01/16/23   Nche, Connye Delaine, NP  atorvastatin  (LIPITOR) 40 MG tablet Take 1 tablet (40 mg total) by mouth 3 (three) times a week. 08/23/23   Nche, Connye Delaine, NP  famotidine  (PEPCID ) 40  MG tablet TAKE 1 TABLET(40 MG) BY MOUTH AT BEDTIME 08/10/23   Asencion Blacksmith, MD  Nintedanib (OFEV ) 150 MG CAPS Take 1 capsule (150 mg total) by mouth 2 (two) times daily. 02/03/24   Mannam, Praveen, MD  pantoprazole  (PROTONIX ) 40 MG tablet Take 1 tablet (40 mg total) by mouth 2 (two) times daily. 02/05/24   McMichael, Elba Greathouse, PA-C  pregabalin  (LYRICA ) 50 MG capsule Take 1 capsule (50 mg total) by mouth 2 (two) times daily. 08/18/23   Nche, Connye Delaine, NP  promethazine -dextromethorphan (PROMETHAZINE -DM) 6.25-15 MG/5ML syrup Take 5 mLs by mouth 3 (three) times daily as needed for cough. 09/22/23   Nche, Connye Delaine, NP  sucralfate  (CARAFATE ) 1 g tablet Take 1 tablet (1 g total) by mouth 4 (four) times daily. 02/05/24   McMichael, Elba Greathouse, PA-C  Vitamin D , Ergocalciferol , (DRISDOL ) 1.25 MG (50000 UNIT) CAPS capsule Take 1 capsule (50,000 Units total) by mouth every 7 (seven) days. 08/18/23   Nche, Connye Delaine, NP    Current Outpatient Medications  Medication Sig Dispense Refill   acetaminophen  (TYLENOL ) 650 MG CR tablet Take 650 mg by mouth every 8 (eight) hours as needed for pain.     Nintedanib (OFEV ) 150 MG CAPS Take 1 capsule (150 mg total) by mouth 2 (two) times daily. 180 capsule 1   pantoprazole  (PROTONIX ) 40 MG tablet Take 1 tablet (40 mg total) by mouth 2 (two) times daily. 60 tablet  3   sucralfate  (CARAFATE ) 1 g tablet Take 1 tablet (1 g total) by mouth 4 (four) times daily. 120 tablet 2   Vitamin D , Ergocalciferol , (DRISDOL ) 1.25 MG (50000 UNIT) CAPS capsule Take 1 capsule (50,000 Units total) by mouth every 7 (seven) days. 12 capsule 0   albuterol  (VENTOLIN  HFA) 108 (90 Base) MCG/ACT inhaler INHALE 1 TO 2 PUFFS INTO THE LUNGS EVERY 6 HOURS AS NEEDED 6.7 g 0   atorvastatin  (LIPITOR) 40 MG tablet Take 1 tablet (40 mg total) by mouth 3 (three) times a week. 36 tablet 3   famotidine  (PEPCID ) 40 MG tablet TAKE 1 TABLET(40 MG) BY MOUTH AT BEDTIME 90 tablet 1   pregabalin  (LYRICA ) 50 MG  capsule Take 1 capsule (50 mg total) by mouth 2 (two) times daily. 180 capsule 1   promethazine -dextromethorphan (PROMETHAZINE -DM) 6.25-15 MG/5ML syrup Take 5 mLs by mouth 3 (three) times daily as needed for cough. 180 mL 0   Current Facility-Administered Medications  Medication Dose Route Frequency Provider Last Rate Last Admin   0.9 %  sodium chloride  infusion  500 mL Intravenous Once Daina Drum, MD        Allergies as of 02/07/2024 - Review Complete 02/07/2024  Allergen Reaction Noted   Iodine Hives 07/11/2012    Family History  Problem Relation Age of Onset   Arthritis Mother    Thyroid  disease Mother    Heart disease Father    Hypertension Father    Diabetes Father    Arthritis Sister    Diabetes Brother    Colon cancer Neg Hx    Stomach cancer Neg Hx    Rectal cancer Neg Hx    Esophageal cancer Neg Hx     Social History   Socioeconomic History   Marital status: Single    Spouse name: Not on file   Number of children: 1   Years of education: Not on file   Highest education level: 12th grade  Occupational History   Occupation: Biomedical engineer: paperworks  Tobacco Use   Smoking status: Former    Current packs/day: 0.00    Average packs/day: 0.5 packs/day for 35.0 years (17.5 ttl pk-yrs)    Types: Cigarettes    Start date: 04/13/1983    Quit date: 04/12/2018    Years since quitting: 5.8    Passive exposure: Never   Smokeless tobacco: Never  Vaping Use   Vaping status: Never Used  Substance and Sexual Activity   Alcohol use: Not Currently   Drug use: No   Sexual activity: Not Currently    Birth control/protection: Post-menopausal  Other Topics Concern   Not on file  Social History Narrative   Not on file   Social Drivers of Health   Financial Resource Strain: Low Risk  (01/22/2024)   Overall Financial Resource Strain (CARDIA)    Difficulty of Paying Living Expenses: Not hard at all  Food Insecurity: No Food Insecurity (01/22/2024)   Hunger  Vital Sign    Worried About Running Out of Food in the Last Year: Never true    Ran Out of Food in the Last Year: Never true  Transportation Needs: No Transportation Needs (01/22/2024)   PRAPARE - Administrator, Civil Service (Medical): No    Lack of Transportation (Non-Medical): No  Physical Activity: Insufficiently Active (01/22/2024)   Exercise Vital Sign    Days of Exercise per Week: 7 days    Minutes of Exercise per Session: 10  min  Stress: No Stress Concern Present (01/22/2024)   [employer] of Occupational Health - Occupational Stress Questionnaire    Feeling of Stress : Not at all  Social Connections: Socially Isolated (01/22/2024)   Social Connection and Isolation Panel [NHANES]    Frequency of Communication with Friends and Family: More than three times a week    Frequency of Social Gatherings with Friends and Family: Once a week    Attends Religious Services: Never    Database administrator or Organizations: No    Attends Banker Meetings: Never    Marital Status: Never married  Intimate Partner Violence: Not At Risk (01/22/2024)   Humiliation, Afraid, Rape, and Kick questionnaire    Fear of Current or Ex-Partner: No    Emotionally Abused: No    Physically Abused: No    Sexually Abused: No    Physical Exam: Vital signs in last 24 hours: BP 117/68   Pulse 83   Temp 97.9 F (36.6 C)   Ht 5' (1.524 m)   Wt 135 lb (61.2 kg)   SpO2 96%   BMI 26.37 kg/m  GEN: NAD EYE: Sclerae anicteric ENT: MMM CV: Non-tachycardic Pulm: No increased WOB GI: Soft NEURO:  Alert & Oriented   Regino Caprio, MD [facility]   02/07/2024 2:00 PM

## 2024-02-07 NOTE — Progress Notes (Signed)
 Pt's states no medical or surgical changes since previsit or office visit.

## 2024-02-07 NOTE — Progress Notes (Signed)
 Report given to PACU, vss

## 2024-02-07 NOTE — Patient Instructions (Signed)
 Please read handouts provided. Continue pantoprazole , famotidine , and sucralfate . Please be diligent about taking these medications. Repeat EGD on April 09, 2024, arrival 12:30 pm. Await pathology results.  YOU HAD AN ENDOSCOPIC PROCEDURE TODAY AT THE [facility]:   Refer to the procedure report that was given to you for any specific questions about what was found during the examination.  If the procedure report does not answer your questions, please call your gastroenterologist to clarify.  If you requested that your care partner not be given the details of your procedure findings, then the procedure report has been included in a sealed envelope for you to review at your convenience later.  YOU SHOULD EXPECT: Some feelings of bloating in the abdomen. Passage of more gas than usual.  Walking can help get rid of the air that was put into your GI tract during the procedure and reduce the bloating. If you had a lower endoscopy (such as a colonoscopy or flexible sigmoidoscopy) you may notice spotting of blood in your stool or on the toilet paper. If you underwent a bowel prep for your procedure, you may not have a normal bowel movement for a few days.  Please Note:  You might notice some irritation and congestion in your nose or some drainage.  This is from the oxygen used during your procedure.  There is no need for concern and it should clear up in a day or so.  SYMPTOMS TO REPORT IMMEDIATELY:   Following upper endoscopy (EGD)  Vomiting of blood or coffee ground material  New chest pain or pain under the shoulder blades  Painful or persistently difficult swallowing  New shortness of breath  Fever of 100F or higher  Black, tarry-looking stools  For urgent or emergent issues, a gastroenterologist can be reached at any hour by calling [phone]. Do not use MyChart messaging for urgent concerns.    DIET:  We do recommend a small meal at first, but then you may proceed to your  regular diet.  Drink plenty of fluids but you should avoid alcoholic beverages for 24 hours.  ACTIVITY:  You should plan to take it easy for the rest of today and you should NOT DRIVE or use heavy machinery until tomorrow (because of the sedation medicines used during the test).    FOLLOW UP: Our staff will call the number listed on your records the next business day following your procedure.  We will call around 7:15- 8:00 am to check on you and address any questions or concerns that you may have regarding the information given to you following your procedure. If we do not reach you, we will leave a message.     If any biopsies were taken you will be contacted by phone or by letter within the next 1-3 weeks.  Please call us  at [phone] if you have not heard about the biopsies in 3 weeks.    SIGNATURES/CONFIDENTIALITY: You and/or your care partner have signed paperwork which will be entered into your electronic medical record.  These signatures attest to the fact that that the information above on your After Visit Summary has been reviewed and is understood.  Full responsibility of the confidentiality of this discharge information lies with you and/or your care-partner.

## 2024-02-07 NOTE — Progress Notes (Signed)
1411 Robinul 0.1 mg IV given due large amount of secretions upon assessment.  MD made aware, vss  

## 2024-02-07 NOTE — Progress Notes (Signed)
 Called to room to assist during endoscopic procedure.  Patient ID and intended procedure confirmed with present staff. Received instructions for my participation in the procedure from the performing physician.

## 2024-02-07 NOTE — Op Note (Signed)
 [facility] Patient Name: Sandra Curtis Procedure Date: 02/07/2024 1:59 PM MRN: [id number] Endoscopist: Freada Jacobs [place] , , [id number] Age: 68 Referring MD:  Date of Birth: Oct 17, 1955 Gender: Female Account #: [id number] Procedure:                Upper GI endoscopy Indications:              Dysphagia, Heartburn, Follow-up of gastric polyps Medicines:                Monitored Anesthesia Care Procedure:                Pre-Anesthesia Assessment:                           - Prior to the procedure, a History and Physical                            was performed, and patient medications and                            allergies were reviewed. The patient's tolerance of                            previous anesthesia was also reviewed. The risks                            and benefits of the procedure and the sedation                            options and risks were discussed with the patient.                            All questions were answered, and informed consent                            was obtained. Prior Anticoagulants: The patient has                            taken no anticoagulant or antiplatelet agents. ASA                            Grade Assessment: II - A patient with mild systemic                            disease. After reviewing the risks and benefits,                            the patient was deemed in satisfactory condition to                            undergo the procedure.                           After obtaining informed consent, the endoscope was  passed under direct vision. Throughout the                            procedure, the patient's blood pressure, pulse, and                            oxygen saturations were monitored continuously. The                            GIF [id number] #[id number] was introduced through the                            mouth, and advanced to the second part of duodenum.                             The upper GI endoscopy was accomplished without                            difficulty. The patient tolerated the procedure                            well. Scope In: Scope Out: Findings:                 LA Grade C (one or more mucosal breaks continuous                            between tops of 2 or more mucosal folds, less than                            75% circumference) esophagitis with no bleeding was                            found in the distal esophagus. Biopsies were taken                            with a cold forceps for histology.                           A large hiatal hernia was present.                           The gastroesophageal flap valve was visualized                            endoscopically and classified as Hill Grade IV (no                            fold, wide open lumen, hiatal hernia present).                           The examined duodenum was normal. Complications:            No immediate complications. Estimated Blood Loss:     Estimated blood loss was minimal. Impression:               -  LA Grade C esophagitis with no bleeding. Biopsied.                           - Large hiatal hernia.                           - Gastroesophageal flap valve classified as Hill                            Grade IV (no fold, wide open lumen, hiatal hernia                            present).                           - Normal examined duodenum. Recommendation:           - Discharge patient to home (with escort).                           - I suspect that esophagitis is the source of the                            patient's dysphagia.                           - Await pathology results.                           - Continue pantoprazole , famotidine , and                            sucralfate . Please ensure that she is mixing her 1                            g sucralfate  tablets in 30 mL of water and then                            swallowing this to allow for better  coating of her                            esophagus. Please be very diligent about                            consistently taking all of these medications to                            allow the inflammation in her esophagus to heal.                           - Repeat EGD in 2 months to allow for healing.                           - If patient is amenable, would refer to general  surgery for consideration of hiatal hernia repair                            since patient is already maximized on reflux                            therapies.                           - The findings and recommendations were discussed                            with the patient. Dr Pedro Bourgeois [address] [place],  02/07/2024 2:33:07 PM

## 2024-02-07 NOTE — Progress Notes (Signed)
 1443 Pt awake and following commands, vss

## 2024-02-08 ENCOUNTER — Telehealth: Payer: Self-pay

## 2024-02-08 NOTE — Telephone Encounter (Signed)
 No answer after follow up call. Voice message left.

## 2024-02-13 LAB — SURGICAL PATHOLOGY

## 2024-02-15 ENCOUNTER — Encounter (HOSPITAL_COMMUNITY): Payer: Self-pay

## 2024-02-18 ENCOUNTER — Encounter: Payer: Self-pay | Admitting: Internal Medicine

## 2024-02-19 DIAGNOSIS — Z1231 Encounter for screening mammogram for malignant neoplasm of breast: Secondary | ICD-10-CM | POA: Diagnosis not present

## 2024-02-19 DIAGNOSIS — Z01419 Encounter for gynecological examination (general) (routine) without abnormal findings: Secondary | ICD-10-CM | POA: Diagnosis not present

## 2024-02-22 ENCOUNTER — Ambulatory Visit: Admitting: Nurse Practitioner

## 2024-02-22 ENCOUNTER — Encounter: Payer: Self-pay | Admitting: Nurse Practitioner

## 2024-02-22 ENCOUNTER — Ambulatory Visit: Payer: Self-pay | Admitting: Nurse Practitioner

## 2024-02-22 VITALS — BP 114/68 | HR 69 | Temp 97.2°F | Ht 60.0 in | Wt 135.8 lb

## 2024-02-22 DIAGNOSIS — E785 Hyperlipidemia, unspecified: Secondary | ICD-10-CM | POA: Diagnosis not present

## 2024-02-22 DIAGNOSIS — E559 Vitamin D deficiency, unspecified: Secondary | ICD-10-CM

## 2024-02-22 DIAGNOSIS — M51372 Other intervertebral disc degeneration, lumbosacral region with discogenic back pain and lower extremity pain: Secondary | ICD-10-CM

## 2024-02-22 DIAGNOSIS — N1831 Chronic kidney disease, stage 3a: Secondary | ICD-10-CM

## 2024-02-22 DIAGNOSIS — E1169 Type 2 diabetes mellitus with other specified complication: Secondary | ICD-10-CM

## 2024-02-22 DIAGNOSIS — N1832 Chronic kidney disease, stage 3b: Secondary | ICD-10-CM | POA: Insufficient documentation

## 2024-02-22 DIAGNOSIS — F32A Depression, unspecified: Secondary | ICD-10-CM | POA: Diagnosis not present

## 2024-02-22 DIAGNOSIS — Z8781 Personal history of (healed) traumatic fracture: Secondary | ICD-10-CM

## 2024-02-22 DIAGNOSIS — M5442 Lumbago with sciatica, left side: Secondary | ICD-10-CM

## 2024-02-22 LAB — LIPID PANEL
Cholesterol: 203 mg/dL — ABNORMAL HIGH (ref 0–200)
HDL: 50.9 mg/dL (ref >39.00)
LDL Cholesterol: 124 mg/dL — ABNORMAL HIGH (ref 0–99)
NonHDL: 152.16
Total CHOL/HDL Ratio: 4
Triglycerides: 141 mg/dL (ref 0.0–149.0)
VLDL: 28.2 mg/dL (ref 0.0–40.0)

## 2024-02-22 LAB — VITAMIN D 25 HYDROXY (VIT D DEFICIENCY, FRACTURES): VITD: 57.12 ng/mL (ref 30.00–100.00)

## 2024-02-22 LAB — HEMOGLOBIN A1C: Hgb A1c MFr Bld: 6.5 % (ref 4.6–6.5)

## 2024-02-22 NOTE — Assessment & Plan Note (Signed)
 Advised to avoid NSAIDs and any other nephrotoxic meds Encouraged to maintain adequate oral hydration. Stable renal function per CMP completed 01/2024

## 2024-02-22 NOTE — Assessment & Plan Note (Signed)
 No adverse effects with atorvastatin  Advised about need for mediterranean diet Repeat lipid panel

## 2024-02-22 NOTE — Assessment & Plan Note (Signed)
 Diet controlled Repeat hgbA1c and UACr Advised to schedule appointment with opthalmology

## 2024-02-22 NOTE — Progress Notes (Signed)
 Established Patient Visit  Patient: Sandra Curtis   DOB: [birth year]   68 y.o. Female  MRN: [id number] Visit Date: 02/22/2024  Subjective:     Chief Complaint  Patient presents with   Follow-up    No concerns   HPI DM (diabetes mellitus) (HCC) Diet controlled Repeat hgbA1c and UACr Advised to schedule appointment with opthalmology  Hyperlipidemia associated with type 2 diabetes mellitus (HCC) No adverse effects with atorvastatin  Advised about need for mediterranean diet Repeat lipid panel  Stage 3a chronic kidney disease (HCC) Advised to avoid NSAIDs and any other nephrotoxic meds Encouraged to maintain adequate oral hydration. Stable renal function per CMP completed 01/2024  Depressive disorder Resolved and stable mood  Vitamin D  deficiency Completed high dose vit. D, current use of OVER THE COUNTER dose 2000IU daily Repeat vit D     02/22/2024   10:26 AM 01/22/2024    9:32 AM 11/08/2023   10:28 AM  Depression screen PHQ 2/9  Decreased Interest 0 0 0  Down, Depressed, Hopeless 0 0   PHQ - 2 Score 0 0 0  Altered sleeping 0 3   Tired, decreased energy 0 3   Change in appetite 0 3   Feeling bad or failure about yourself  0 1   Trouble concentrating 0 0   Moving slowly or fidgety/restless 0 0   Suicidal thoughts 0 0   PHQ-9 Score 0 10   Difficult doing work/chores Not difficult at all Somewhat difficult        02/22/2024   10:27 AM 09/21/2022    1:43 PM 01/12/2021    8:48 AM 11/17/2020   10:32 AM  GAD 7 : Generalized Anxiety Score  Nervous, Anxious, on Edge 0 2 0 0  Control/stop worrying 0 2 0 0  Worry too much - different things 0 2 0 0  Trouble relaxing 0 2 3 3   Restless 0 1 0 0  Easily annoyed or irritable 0 2 1 3   Afraid - awful might happen 0 1 0 0  Total GAD 7 Score 0 12 4 6   Anxiety Difficulty Not difficult at all Somewhat difficult Not difficult at all Not difficult at all     Reviewed medical, surgical, and social history  today  Medications: Outpatient Medications Prior to Visit  Medication Sig   acetaminophen  (TYLENOL ) 650 MG CR tablet Take 650 mg by mouth every 8 (eight) hours as needed for pain.   albuterol  (VENTOLIN  HFA) 108 (90 Base) MCG/ACT inhaler INHALE 1 TO 2 PUFFS INTO THE LUNGS EVERY 6 HOURS AS NEEDED   atorvastatin  (LIPITOR) 40 MG tablet Take 1 tablet (40 mg total) by mouth 3 (three) times a week.   Nintedanib (OFEV ) 150 MG CAPS Take 1 capsule (150 mg total) by mouth 2 (two) times daily.   pantoprazole  (PROTONIX ) 40 MG tablet Take 1 tablet (40 mg total) by mouth 2 (two) times daily.   pregabalin  (LYRICA ) 50 MG capsule Take 1 capsule (50 mg total) by mouth 2 (two) times daily.   promethazine -dextromethorphan (PROMETHAZINE -DM) 6.25-15 MG/5ML syrup Take 5 mLs by mouth 3 (three) times daily as needed for cough.   sucralfate  (CARAFATE ) 1 g tablet Take 1 tablet (1 g total) by mouth 4 (four) times daily.   [DISCONTINUED] Vitamin D , Ergocalciferol , (DRISDOL ) 1.25 MG (50000 UNIT) CAPS capsule Take 1 capsule (50,000 Units total) by mouth every 7 (seven) days.   [DISCONTINUED]  famotidine  (PEPCID ) 40 MG tablet TAKE 1 TABLET(40 MG) BY MOUTH AT BEDTIME (Patient not taking: Reported on 02/22/2024)   No facility-administered medications prior to visit.   Reviewed past medical and social history.   ROS per HPI above      Objective:  BP 114/68 (BP Location: Left Arm, Patient Position: Sitting, Cuff Size: Normal)   Pulse 69   Temp (!) 97.2 F (36.2 C) (Temporal)   Ht 5' (1.524 m)   Wt 135 lb 12.8 oz (61.6 kg)   SpO2 97%   BMI 26.52 kg/m      Physical Exam Vitals and nursing note reviewed.  Cardiovascular:     Rate and Rhythm: Normal rate and regular rhythm.     Pulses: Normal pulses.     Heart sounds: Normal heart sounds.  Pulmonary:     Effort: Pulmonary effort is normal.     Breath sounds: Normal breath sounds.  Musculoskeletal:     Right lower leg: No edema.     Left lower leg: No edema.   Neurological:     Mental Status: She is alert and oriented to person, place, and time.     No results found for any visits on 02/22/24.    Assessment & Plan:    Problem List Items Addressed This Visit     Depressive disorder   Resolved and stable mood      DM (diabetes mellitus) (HCC)   Diet controlled Repeat hgbA1c and UACr Advised to schedule appointment with opthalmology      Relevant Orders   Microalbumin / creatinine urine ratio   Hemoglobin A1c   Hyperlipidemia associated with type 2 diabetes mellitus (HCC) - Primary   No adverse effects with atorvastatin  Advised about need for mediterranean diet Repeat lipid panel      Relevant Orders   Lipid panel   Stage 3a chronic kidney disease (HCC)   Advised to avoid NSAIDs and any other nephrotoxic meds Encouraged to maintain adequate oral hydration. Stable renal function per CMP completed 01/2024      Relevant Orders   Microalbumin / creatinine urine ratio   Vitamin D  deficiency   Completed high dose vit. D, current use of OVER THE COUNTER dose 2000IU daily Repeat vit D      Relevant Orders   VITAMIN D  25 Hydroxy (Vit-D Deficiency, Fractures)   Return in about 6 months (around 08/24/2024) for HTN, DM, hyperlipidemia (fasting).     Kathrene Parents, NP

## 2024-02-22 NOTE — Assessment & Plan Note (Signed)
 Resolved and stable mood

## 2024-02-22 NOTE — Patient Instructions (Addendum)
 Go to lab Maintain Heart healthy diet and daily exercise. Maintain current medications. Clarify Protonix  dose with GI Schedule appointment with ophthalmology for annual DIABETES eye exam.  Mediterranean Diet A Mediterranean diet is based on the traditions of countries on the [employer]. It focuses on eating more: Fruits and vegetables. Whole grains, beans, nuts, and seeds. Heart-healthy fats. These are fats that are good for your heart. It involves eating less: Dairy. Meat and eggs. Processed foods with added sugar, salt, and fat. This type of diet can help prevent certain conditions. It can also improve outcomes if you have a long-term (chronic) disease, such as kidney or heart disease. What are tips for following this plan? Reading food labels Check packaged foods for: The serving size. For foods such as rice and pasta, the serving size is the amount of cooked product, not dry. The total fat. Avoid foods with saturated fat or trans fat. Added sugars, such as corn syrup. Shopping  Try to have a balanced diet. Buy a variety of foods, such as: Fresh fruits and vegetables. You may be able to get these from local farmers markets. You can also buy them frozen. Grains, beans, nuts, and seeds. Some of these can be bought in bulk. Fresh seafood. Poultry and eggs. Low-fat dairy products. Buy whole ingredients instead of foods that have already been packaged. If you can't get fresh seafood, buy precooked frozen shrimp or canned fish, such as tuna, salmon, or sardines. Stock your pantry so you always have certain foods on hand, such as olive oil, canned tuna, canned tomatoes, rice, pasta, and beans. Cooking Cook foods with extra-virgin olive oil instead of using butter or other vegetable oils. Have meat as a side dish. Have vegetables or grains as your main dish. This means having meat in small portions or adding small amounts of meat to foods like pasta or stew. Use beans or  vegetables instead of meat in common dishes like chili or lasagna. Try out different cooking methods. Try roasting, broiling, steaming, and sauting vegetables. Add frozen vegetables to soups, stews, pasta, or rice. Add nuts or seeds for added healthy fats and plant protein at each meal. You can add these to yogurt, salads, or vegetable dishes. Marinate fish or vegetables using olive oil, lemon juice, garlic, and fresh herbs. Meal planning Plan to eat a vegetarian meal one day each week. Try to work up to two vegetarian meals, if possible. Eat seafood two or more times a week. Have healthy snacks on hand. These may include: Vegetable sticks with hummus. Greek yogurt. Fruit and nut trail mix. Eat balanced meals. These should include: Fruit: 2-3 servings a day. Vegetables: 4-5 servings a day. Low-fat dairy: 2 servings a day. Fish, poultry, or lean meat: 1 serving a day. Beans and legumes: 2 or more servings a week. Nuts and seeds: 1-2 servings a day. Whole grains: 6-8 servings a day. Extra-virgin olive oil: 3-4 servings a day. Limit red meat and sweets to just a few servings a month. Lifestyle  Try to cook and eat meals with your family. Drink enough fluid to keep your pee (urine) pale yellow. Be active every day. This includes: Aerobic exercise, which is exercise that causes your heart to beat faster. Examples include running and swimming. Leisure activities like gardening, walking, or housework. Get 7-8 hours of sleep each night. Drink red wine if your provider says you can. A glass of wine is 5 oz (150 mL). You may be allowed to have: Up to  1 glass a day if you're female and not pregnant. Up to 2 glasses a day if you're female. What foods should I eat? Fruits Apples. Apricots. Avocado. Berries. Bananas. Cherries. Dates. Figs. Grapes. Lemons. Melon. Oranges. Peaches. Plums. Pomegranate. Vegetables Artichokes. Beets. Broccoli. Cabbage. Carrots. Eggplant. Green beans. Chard. Kale.  Spinach. Onions. Leeks. Peas. Squash. Tomatoes. Peppers. Radishes. Grains Whole-grain pasta. Brown rice. Bulgur wheat. Polenta. Couscous. Whole-wheat bread. Dwyane Glad. Meats and other proteins Beans. Almonds. Sunflower seeds. Pine nuts. Peanuts. Cod. Salmon. Scallops. Shrimp. Tuna. Tilapia. Clams. Oysters. Eggs. Chicken or [place] without skin. Dairy Low-fat milk. Cheese. Greek yogurt. Fats and oils Extra-virgin olive oil. Avocado oil. Grapeseed oil. Beverages Water. Red wine. Herbal tea. Sweets and desserts Greek yogurt with honey. Baked apples. Poached pears. Trail mix. Seasonings and condiments Basil. Cilantro. Coriander. Cumin. Mint. Parsley. Sage. Rosemary. Tarragon. Garlic. Oregano. Thyme. Pepper. Balsamic vinegar. Tahini. Hummus. Tomato sauce. Olives. Mushrooms. The items listed above may not be all the foods and drinks you can have. Talk to a dietitian to learn more. What foods should I limit? This is a list of foods that should be eaten rarely. Fruits Fruit canned in syrup. Vegetables Deep-fried potatoes, like [place] fries. Grains Packaged pasta or rice dishes. Cereal with added sugar. Snacks with added sugar. Meats and other proteins Beef. Pork. Lamb. Chicken or [place] with skin. Hot dogs. Helene Loader. Dairy Ice cream. Sour cream. Whole milk. Fats and oils Butter. Canola oil. Vegetable oil. Beef fat (tallow). Lard. Beverages Juice. Sugar-sweetened soft drinks. Beer. Liquor and spirits. Sweets and desserts Cookies. Cakes. Pies. Candy. Seasonings and condiments Mayonnaise. Pre-made sauces and marinades. The items listed above may not be all the foods and drinks you should limit. Talk to a dietitian to learn more. Where to find more information American Heart Association (AHA): heart.org This information is not intended to replace advice given to you by your health care provider. Make sure you discuss any questions you have with your health care provider. Document  Revised: 01/08/2023 Document Reviewed: 01/08/2023 Elsevier Patient Education  2024 [employer].

## 2024-02-22 NOTE — Assessment & Plan Note (Signed)
 Completed high dose vit. D, current use of OVER THE COUNTER dose 2000IU daily Repeat vit D

## 2024-02-27 ENCOUNTER — Other Ambulatory Visit: Payer: Self-pay | Admitting: Physician Assistant

## 2024-02-28 ENCOUNTER — Telehealth: Payer: Self-pay | Admitting: Pulmonary Disease

## 2024-02-28 NOTE — Telephone Encounter (Signed)
 Patient is here in person dropping off FMLA forms on behalf of her daughter Joliana Claflin. Patient was notified of $29 fee and 14 business day turn around period. Forms will be placed in Dr. Isabel Many box.

## 2024-02-28 NOTE — Telephone Encounter (Signed)
 Forms have been placed in Dr. Isabel Many box.

## 2024-03-01 DIAGNOSIS — N1831 Chronic kidney disease, stage 3a: Secondary | ICD-10-CM | POA: Diagnosis not present

## 2024-03-01 DIAGNOSIS — R2681 Unsteadiness on feet: Secondary | ICD-10-CM | POA: Diagnosis not present

## 2024-03-01 DIAGNOSIS — E1169 Type 2 diabetes mellitus with other specified complication: Secondary | ICD-10-CM | POA: Diagnosis not present

## 2024-03-01 DIAGNOSIS — Z008 Encounter for other general examination: Secondary | ICD-10-CM | POA: Diagnosis not present

## 2024-03-01 DIAGNOSIS — E785 Hyperlipidemia, unspecified: Secondary | ICD-10-CM | POA: Diagnosis not present

## 2024-03-01 DIAGNOSIS — E1122 Type 2 diabetes mellitus with diabetic chronic kidney disease: Secondary | ICD-10-CM | POA: Diagnosis not present

## 2024-03-01 DIAGNOSIS — Z6825 Body mass index (BMI) 25.0-25.9, adult: Secondary | ICD-10-CM | POA: Diagnosis not present

## 2024-03-01 DIAGNOSIS — E663 Overweight: Secondary | ICD-10-CM | POA: Diagnosis not present

## 2024-03-06 DIAGNOSIS — E1169 Type 2 diabetes mellitus with other specified complication: Secondary | ICD-10-CM | POA: Diagnosis not present

## 2024-03-06 NOTE — Telephone Encounter (Signed)
 Forms have been successfully signed and faxed as of May 23rd.

## 2024-03-26 ENCOUNTER — Encounter: Payer: Self-pay | Admitting: Internal Medicine

## 2024-03-26 DIAGNOSIS — E119 Type 2 diabetes mellitus without complications: Secondary | ICD-10-CM | POA: Diagnosis not present

## 2024-03-27 ENCOUNTER — Ambulatory Visit: Payer: Self-pay

## 2024-03-27 ENCOUNTER — Ambulatory Visit (INDEPENDENT_AMBULATORY_CARE_PROVIDER_SITE_OTHER): Admitting: Nurse Practitioner

## 2024-03-27 VITALS — BP 110/62 | HR 88 | Temp 98.5°F | Ht 60.0 in | Wt 137.4 lb

## 2024-03-27 DIAGNOSIS — R059 Cough, unspecified: Secondary | ICD-10-CM | POA: Diagnosis not present

## 2024-03-27 DIAGNOSIS — U071 COVID-19: Secondary | ICD-10-CM | POA: Diagnosis not present

## 2024-03-27 LAB — POC COVID19 BINAXNOW: SARS Coronavirus 2 Ag: POSITIVE — AB

## 2024-03-27 MED ORDER — NIRMATRELVIR/RITONAVIR (PAXLOVID) TABLET (RENAL DOSING): 2.0000 | ORAL_TABLET | Freq: Two times a day (BID) | ORAL | 0 refills | Status: AC

## 2024-03-27 MED ORDER — BENZONATATE 100 MG PO CAPS: 100.0000 mg | ORAL_CAPSULE | Freq: Three times a day (TID) | ORAL | 0 refills | Status: DC | PRN

## 2024-03-27 MED ORDER — PREDNISONE 20 MG PO TABS: 40.0000 mg | ORAL_TABLET | Freq: Every day | ORAL | 0 refills | Status: DC

## 2024-03-27 NOTE — Telephone Encounter (Signed)
 Copied from CRM [id number]. Topic: Clinical - Red Word Triage >> Mar 27, 2024  2:56 PM Alyse July wrote: Red Word that prompted transfer to Nurse Triage: Increased Sputum, productive cough & loss of taste and smell   FYI Only or Action Required?: FYI only for provider  Patient was last seen in primary care on 02/22/2024 by Nche, Connye Delaine, NP. Called Nurse Triage reporting Cough. Symptoms began several days ago. Interventions attempted: OTC medications: Vicks. Symptoms are: gradually worsening.  Triage Disposition: See HCP Within 4 Hours (Or PCP Triage)  Patient/caregiver understands and will follow disposition?: Yes   Reason for Disposition  Wheezing is present  Answer Assessment - Initial Assessment Questions 1. ONSET: When did the cough begin?      3 days ago  2. SEVERITY: How bad is the cough today?      Moderate  3. SPUTUM: Describe the color of your sputum (none, dry cough; clear, white, yellow, green)     White  4. HEMOPTYSIS: Are you coughing up any blood? If so ask: How much? (flecks, streaks, tablespoons, etc.)     No 5. DIFFICULTY BREATHING: Are you having difficulty breathing? If Yes, ask: How bad is it? (e.g., mild, moderate, severe)    - MILD: No SOB at rest, mild SOB with walking, speaks normally in sentences, can lie down, no retractions, pulse < 100.    - MODERATE: SOB at rest, SOB with minimal exertion and prefers to sit, cannot lie down flat, speaks in phrases, mild retractions, audible wheezing, pulse 100-120.    - SEVERE: Very SOB at rest, speaks in single words, struggling to breathe, sitting hunched forward, retractions, pulse > 120      No 6. FEVER: Do you have a fever? If Yes, ask: What is your temperature, how was it measured, and when did it start?     No 7. CARDIAC HISTORY: Do you have any history of heart disease? (e.g., heart attack, congestive heart failure)      Yes 8. LUNG HISTORY: Do you have any history of lung disease?   (e.g., pulmonary embolus, asthma, emphysema)     Yes 9. PE RISK FACTORS: Do you have a history of blood clots? (or: recent major surgery, recent prolonged travel, bedridden)     No 10. OTHER SYMPTOMS: Do you have any other symptoms? (e.g., runny nose, wheezing, chest pain)       Nasal congestion, loss of taste and smell, wheezing  Protocols used: Cough - Acute Productive-A-AH

## 2024-03-27 NOTE — Patient Instructions (Signed)
 It was great to see you!  Start paxlovid twice a day for 5 days   Start prednisone  2 tablet daily with food in the morning   Keep using your albuterol  inhaler  Keep drinking fluids   Stop your atorvastatin  (lipitor) while taking paxlovid   Let's follow-up if your symptoms worsen or don't improve   Take care,  Rheba Cedar, NP

## 2024-03-27 NOTE — Progress Notes (Signed)
 Acute Office Visit  Subjective:     Patient ID: Sandra Curtis, female    DOB: 1955/10/16, 68 y.o.   MRN: [id number]  Chief Complaint  Patient presents with   Cough    With wheezing, productive cough since Sunday, unable to smell or smell since Monday, SOB    HPI Discussed the use of AI scribe software for clinical note transcription with the patient, who gave verbal consent to proceed.  History of Present Illness   Jalon Latima Hamza is a 68 year old female with pulmonary fibrosis who presents with cough and wheezing.  She experiences a severe, productive cough with white and yellow mucus, starting Sunday night, accompanied by wheezing. She spent Monday in bed due to feeling unwell. By Tuesday, there was some improvement, but mucus production and nasal congestion persist. She denies a sore throat but has soreness from persistent coughing. Shortness of breath is present, consistent with her baseline, limiting her to short walks. She uses a Vicks nasal inhaler at night, which provides relief. There is a loss of taste and smell. Her sister had bronchitis last month, and her mother recently had similar symptoms treated with antibiotics. She went to the grocery store last Thursday without a mask. She uses an albuterol  inhaler for wheezing and shortness of breath.      ROS See pertinent positives and negatives per HPI.     Objective:    BP 110/62 (BP Location: Left Arm, Patient Position: Sitting, Cuff Size: Normal)   Pulse 88   Temp 98.5 F (36.9 C)   Ht 5' (1.524 m)   Wt 137 lb 6.4 oz (62.3 kg)   SpO2 94%   BMI 26.83 kg/m    Physical Exam Vitals and nursing note reviewed.  Constitutional:      General: She is not in acute distress.    Appearance: Normal appearance.  HENT:     Head: Normocephalic.     Right Ear: Tympanic membrane, ear canal and external ear normal.     Left Ear: Tympanic membrane, ear canal and external ear normal.     Mouth/Throat:      Mouth: Mucous membranes are moist.     Pharynx: No posterior oropharyngeal erythema.   Eyes:     Conjunctiva/sclera: Conjunctivae normal.    Cardiovascular:     Rate and Rhythm: Normal rate and regular rhythm.     Pulses: Normal pulses.     Heart sounds: Normal heart sounds.  Pulmonary:     Effort: Pulmonary effort is normal.     Breath sounds: Wheezing and rhonchi present.   Musculoskeletal:     Cervical back: Normal range of motion and neck supple. No tenderness.  Lymphadenopathy:     Cervical: No cervical adenopathy.   Skin:    General: Skin is warm.   Neurological:     General: No focal deficit present.     Mental Status: She is alert and oriented to person, place, and time.   Psychiatric:        Mood and Affect: Mood normal.        Behavior: Behavior normal.        Thought Content: Thought content normal.        Judgment: Judgment normal.     Results for orders placed or performed in visit on 03/27/24  POC COVID-19  Result Value Ref Range   SARS Coronavirus 2 Ag Positive (A) Negative        Assessment & Plan:  COVID-19   She tested positive for COVID-19, presenting with cough, wheezing, and anosmia. Start Paxlovid twice daily for five days. Prescribe Tessalon  Perles for cough relief and prednisone  40mg  daily with food in the morning for five days. Continue using the albuterol  inhaler as needed. Encourage increased fluid intake. Temporarily discontinue atorvastatin  due to interaction with Paxlovid. Follow-up with worsening breathing or other concerns.   Cough POC covid-19 test positive, see plan above. Start tessalon  TID prn cough.    Meds ordered this encounter  Medications   nirmatrelvir/ritonavir, renal dosing, (PAXLOVID) 10 x 150 MG & 10 x 100MG  TABS    Sig: Take 2 tablets by mouth 2 (two) times daily for 5 days. (Take nirmatrelvir 150 mg one tablet twice daily for 5 days and ritonavir 100 mg one tablet twice daily for 5 days) Patient GFR is 43     Dispense:  20 tablet    Refill:  0   benzonatate  (TESSALON ) 100 MG capsule    Sig: Take 1 capsule (100 mg total) by mouth 3 (three) times daily as needed for cough.    Dispense:  30 capsule    Refill:  0   predniSONE  (DELTASONE ) 20 MG tablet    Sig: Take 2 tablets (40 mg total) by mouth daily with breakfast.    Dispense:  10 tablet    Refill:  0    Return if symptoms worsen or fail to improve.  Odette Benjamin, NP

## 2024-03-27 NOTE — Telephone Encounter (Signed)
 Noted. Appointment made by nurse triage for today with Rheba Cedar, NP.

## 2024-03-29 ENCOUNTER — Telehealth: Payer: Self-pay

## 2024-03-29 NOTE — Telephone Encounter (Signed)
 Pt called stating paxlovid is $500. Pt instructed to go to [email or web address] and download the copay card. Pt expresses understanding.

## 2024-04-01 ENCOUNTER — Telehealth: Payer: Self-pay

## 2024-04-01 NOTE — Telephone Encounter (Signed)
 Please advise   Copied from CRM [id number]. Topic: Clinical - Prescription Issue >> Mar 29, 2024  9:53 AM Berneda FALCON wrote: Reason for CRM: Pt was seen on 6/18 and tested positive for COVID, but the RX that was sent in was over $500  nirmatrelvir/ritonavir, renal dosing, (PAXLOVID) was the name of the medication   Pharmacy  [id number] [place], [place] - [address] AT [facility] & [address] [place], [place] [address] Phone: [phone]  Fax: [phone] DEA #: [id number] DAW Reason: --    Wants to know what other options there are or why her insurance did not cover this medication.   Pt callback is [phone] >> Mar 29, 2024  3:31 PM Robinson H wrote: Patient calling following up on message sent earlier regarding medication sent to pharmacy being to expensive, reached out to CAL and transferred to clinic supervisor

## 2024-04-03 ENCOUNTER — Ambulatory Visit: Payer: Self-pay | Admitting: Nurse Practitioner

## 2024-04-03 ENCOUNTER — Encounter: Payer: Self-pay | Admitting: Nurse Practitioner

## 2024-04-03 ENCOUNTER — Ambulatory Visit (INDEPENDENT_AMBULATORY_CARE_PROVIDER_SITE_OTHER)
Admission: RE | Admit: 2024-04-03 | Discharge: 2024-04-03 | Disposition: A | Discharge status: Home / Self Care | Source: Ambulatory Visit | Attending: Nurse Practitioner | Admitting: Nurse Practitioner

## 2024-04-03 ENCOUNTER — Ambulatory Visit (INDEPENDENT_AMBULATORY_CARE_PROVIDER_SITE_OTHER): Admitting: Nurse Practitioner

## 2024-04-03 VITALS — BP 122/66 | HR 96 | Temp 98.6°F | Ht 60.0 in | Wt 134.8 lb

## 2024-04-03 DIAGNOSIS — U071 COVID-19: Secondary | ICD-10-CM

## 2024-04-03 DIAGNOSIS — R0602 Shortness of breath: Secondary | ICD-10-CM | POA: Diagnosis not present

## 2024-04-03 DIAGNOSIS — R058 Other specified cough: Secondary | ICD-10-CM | POA: Diagnosis not present

## 2024-04-03 NOTE — Assessment & Plan Note (Addendum)
 Symptom onset 03/24/2024, positive COVID test on 03/27/2024, paxlovid and benzonatate  sent, she did not start meds as prescribed due to cost of paxlovid. Today she presents with persistent cough, worsening SOB and generalized malaise.  No need for repeat COVID test, she is out of the window to start paxlovid at this point. Sent for CXR to r/o pneumonia Advised to use albuterol , benzonatate  and mucinex  DIABETES Provided ED praucaution

## 2024-04-03 NOTE — Patient Instructions (Signed)
 Go to [address] for CXR Use benzonatate  and mucinex  DIABETES for cough Use albuterol  2puff every8hrs x 3days, then every 6hrs as needed for wheezing and shortness of breath.  COVID-19: What to Know COVID-19 is an infection caused by a virus called SARS-CoV-2. This type of virus is called a coronavirus. People with COVID-19 may: Have few to no symptoms. Have mild to moderate symptoms that affect their lungs and breathing. Get very sick. What are the causes?  COVID-19 is caused by a virus. This virus may be in the air as droplets or on surfaces. It can spread from an infected person when they cough, sneeze, speak, sing, or breathe. You may become infected if: You breathe in the infected droplets in the air. You touch an object that has the virus on it. What increases the risk? You are at risk of getting COVID-19 if you have been around someone with the infection. You may be more likely to get very sick if: You are 30 years old or older. You have certain medical conditions, such as: Heart disease. Diabetes. Long-term respiratory disease. Cancer. Pregnancy. You are immunocompromised. This means your body can't fight infections easily. You have a disability that makes it hard for you to move around, you have trouble moving, or you can't move at all. What are the signs or symptoms? People may have different symptoms from COVID-19. The symptoms can also be mild to very bad. They often show up in 5-6 days after being infected. But, they can take up to 14 days to appear. Common symptoms are: Cough. Feeling tired. New loss of taste or smell. Fever. Less common symptoms are: Sore throat. Headache. Body or muscle aches. Diarrhea. A skin rash or fingers or toes that are a different color than usual. Red or irritated eyes. Sometimes, COVID-19 does not cause symptoms. How is this diagnosed? COVID-19 can be diagnosed with tests done in the lab or at home. Fluid from your nose, mouth,  or lungs will be used to check for the virus. How is this treated? Treatment for COVID-19 depends on how sick you are. Mild symptoms can be treated at home with rest, fluids, and over-the-counter medicines. very bad symptoms may be treated in a hospital intensive care unit (ICU). If you have symptoms and are at risk of getting very sick, you may be given a medicine that fights viruses. This medicine is called an antiviral. How is this prevented? To protect yourself from COVID-19: Know your risk factors. Get vaccinated. If your body can't fight infections easily, talk to your provider about treatment to help prevent COVID-19. Stay at least about 3 feet (1 meter) away from other people. Wear mask that fits well when: You can't stay at a distance from people. You're in a place with not a lot of air flow. Try to be in open spaces with good air flow when you are in public. Wash your hands often or use an alcohol-based hand sanitizer. Cover your nose and mouth when you cough or sneeze. If you think you have COVID-19 or have been around someone who has it, stay home and away from other people as told by your provider or health officials. Where to find more information To learn more: Go to [employer] Click Health Topics. Type COVID-19 in the search box. Go to VisitDestination.com.br Click Health Topics. Then click All Topics. Type COVID-19 in the search box. Get help right away if: You have trouble breathing or get short of breath. You  have pain or pressure in your chest. You're feeling confused. These symptoms may be an emergency. Get help right away. Call 911. Do not wait to see if the symptoms will go away. Do not drive yourself to the hospital. This information is not intended to replace advice given to you by your health care provider. Make sure you discuss any questions you have with your health care provider. Document Revised: 06/29/2023 Document Reviewed: 06/21/2023 Elsevier Patient  Education  2025 [employer].

## 2024-04-03 NOTE — Progress Notes (Signed)
 Acute Office Visit  Subjective:    Patient ID: Sandra Curtis, female    DOB: [birth year], 68 y.o.   MRN: [id number]  Chief Complaint  Patient presents with   Cough    Sx started on 03/24/24-Productive cough (phlegm), headache, no taste or smell    HPI COVID-19 Symptom onset 03/24/2024, positive COVID test on 03/27/2024, paxlovid and benzonatate  sent, she did not start meds as prescribed due to cost of paxlovid. Today she presents with persistent cough, worsening SOB and generalized malaise.  No need for repeat COVID test, she is out of the window to start paxlovid at this point. Sent for CXR to r/o pneumonia Advised to use albuterol , benzonatate  and mucinex  DIABETES Provided ED praucaution  Outpatient Medications Prior to Visit  Medication Sig   acetaminophen  (TYLENOL ) 650 MG CR tablet Take 650 mg by mouth every 8 (eight) hours as needed for pain.   albuterol  (VENTOLIN  HFA) 108 (90 Base) MCG/ACT inhaler INHALE 1 TO 2 PUFFS INTO THE LUNGS EVERY 6 HOURS AS NEEDED   atorvastatin  (LIPITOR) 40 MG tablet Take 1 tablet (40 mg total) by mouth 3 (three) times a week.   benzonatate  (TESSALON ) 100 MG capsule Take 1 capsule (100 mg total) by mouth 3 (three) times daily as needed for cough.   famotidine  (PEPCID ) 40 MG tablet Take 40 mg by mouth daily.   Nintedanib (OFEV ) 150 MG CAPS Take 1 capsule (150 mg total) by mouth 2 (two) times daily.   pantoprazole  (PROTONIX ) 40 MG tablet Take 1 tablet (40 mg total) by mouth 2 (two) times daily.   predniSONE  (DELTASONE ) 20 MG tablet Take 2 tablets (40 mg total) by mouth daily with breakfast.   pregabalin  (LYRICA ) 50 MG capsule Take 1 capsule (50 mg total) by mouth 2 (two) times daily.   sucralfate  (CARAFATE ) 1 g tablet TAKE 1 TABLET(1 GRAM) BY MOUTH FOUR TIMES DAILY AT BEDTIME WITH MEALS   [DISCONTINUED] promethazine -dextromethorphan (PROMETHAZINE -DM) 6.25-15 MG/5ML syrup Take 5 mLs by mouth 3 (three) times daily as needed for cough.   No  facility-administered medications prior to visit.    Reviewed past medical and social history.  Review of Systems Per HPI     Objective:    Physical Exam Vitals and nursing note reviewed.  Constitutional:      General: She is not in acute distress.  Cardiovascular:     Rate and Rhythm: Normal rate and regular rhythm.     Pulses: Normal pulses.     Heart sounds: Normal heart sounds.  Pulmonary:     Effort: Pulmonary effort is normal.     Breath sounds: Normal breath sounds.   Musculoskeletal:     Right lower leg: No edema.     Left lower leg: No edema.   Neurological:     Mental Status: She is alert and oriented to person, place, and time.    BP 122/66 (BP Location: Left Arm, Patient Position: Sitting, Cuff Size: Normal)   Pulse 96   Temp 98.6 F (37 C) (Oral)   Ht 5' (1.524 m)   Wt 134 lb 12.8 oz (61.1 kg)   SpO2 97%   BMI 26.33 kg/m    No results found for any visits on 04/03/24.     Assessment & Plan:   Problem List Items Addressed This Visit     COVID-19 - Primary   Symptom onset 03/24/2024, positive COVID test on 03/27/2024, paxlovid and benzonatate  sent, she did not start meds as prescribed due  to cost of paxlovid. Today she presents with persistent cough, worsening SOB and generalized malaise.  No need for repeat COVID test, she is out of the window to start paxlovid at this point. Sent for CXR to r/o pneumonia Advised to use albuterol , benzonatate  and mucinex  DIABETES Provided ED praucaution      Relevant Orders   DG Chest 2 View   No orders of the defined types were placed in this encounter.  Return if symptoms worsen or fail to improve.    Roselie Mood, NP

## 2024-04-04 ENCOUNTER — Telehealth: Payer: Self-pay | Admitting: Internal Medicine

## 2024-04-04 NOTE — Telephone Encounter (Signed)
 Good morning Dr. Federico this patient has called our office to inform us  that she has tested positive for covid. Patient was schedule for July the 1 st. Patient was rescheduled for July the 29 th. Please advise.

## 2024-04-09 ENCOUNTER — Encounter: Admitting: Internal Medicine

## 2024-04-22 ENCOUNTER — Ambulatory Visit: Admitting: Nurse Practitioner

## 2024-05-01 DIAGNOSIS — H40013 Open angle with borderline findings, low risk, bilateral: Secondary | ICD-10-CM | POA: Diagnosis not present

## 2024-05-07 ENCOUNTER — Ambulatory Visit (AMBULATORY_SURGERY_CENTER): Admitting: Internal Medicine

## 2024-05-07 ENCOUNTER — Encounter: Payer: Self-pay | Admitting: Internal Medicine

## 2024-05-07 VITALS — BP 122/60 | HR 72 | Temp 98.0°F | Resp 21 | Ht 60.0 in | Wt 134.0 lb

## 2024-05-07 DIAGNOSIS — K222 Esophageal obstruction: Secondary | ICD-10-CM | POA: Diagnosis not present

## 2024-05-07 DIAGNOSIS — F32A Depression, unspecified: Secondary | ICD-10-CM | POA: Diagnosis not present

## 2024-05-07 DIAGNOSIS — K449 Diaphragmatic hernia without obstruction or gangrene: Secondary | ICD-10-CM | POA: Diagnosis not present

## 2024-05-07 DIAGNOSIS — R1310 Dysphagia, unspecified: Secondary | ICD-10-CM

## 2024-05-07 MED ORDER — SODIUM CHLORIDE 0.9 % IV SOLN: 500.0000 mL | Freq: Once | INTRAVENOUS | Status: DC

## 2024-05-07 NOTE — Progress Notes (Signed)
 GASTROENTEROLOGY PROCEDURE H&P NOTE   Primary Care Physician: Katheen Roselie Rockford, NP    Reason for Procedure:   History of grade C esophagitis, dysphagia  Plan:    EGD  Patient is appropriate for endoscopic procedure(s) in the ambulatory ([facility]) setting.  The nature of the procedure, as well as the risks, benefits, and alternatives were carefully and thoroughly reviewed with the patient. Ample time for discussion and questions allowed. The patient understood, was satisfied, and agreed to proceed.     HPI: Sandra Curtis is a 68 y.o. female who presents for EGD for history of grade C esophagitis, dysphagia. She does feel like the medications have been working.  EGD 02/07/24: - LA Grade C esophagitis with no bleeding. Biopsied. - Large hiatal hernia. 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 - Normal examined duodenum. Path: 1. Surgical [P], esophagitis :       -  SQUAMOUS MUCOSA WITH REACTIVE/REPARATIVE CHANGE AND SEPARATE FRAGMENTS OF       FIBRIN NO PURULENT DEBRIS AND GRANULATION TISSUE CONSISTENT WITH       EROSIVE/ULCERATIVE ESOPHAGITIS.       -  IMMUNOHISTOCHEMICAL STAINS FOR CMV AND HSV 1/2 ARE NEGATIVE.       -  A PAS STAIN IS NEGATIVE FOR FUNGAL FORMS.   Past Medical History:  Diagnosis Date   Allergic rhinitis    Allergy    Chronic back pain 07/20/2018   Chronic midline low back pain 02/06/2017   Depression    Esophageal stricture    GERD (gastroesophageal reflux disease)    Inflammatory polyps of colon (HCC) 2010   Pulmonary filariasis (HCC)    Systolic murmur    UTI (urinary tract infection)     Past Surgical History:  Procedure Laterality Date   BREAST BIOPSY Left 01/22/2021   COLONOSCOPY     UPPER GASTROINTESTINAL ENDOSCOPY      Prior to Admission medications   Medication Sig Start Date End Date Taking? Authorizing Provider  acetaminophen  (TYLENOL ) 650 MG CR tablet Take  650 mg by mouth every 8 (eight) hours as needed for pain.   Yes [provider]  atorvastatin  (LIPITOR) 40 MG tablet Take 1 tablet (40 mg total) by mouth 3 (three) times a week. 08/23/23  Yes Nche, Roselie Rockford, NP  famotidine  (PEPCID ) 40 MG tablet Take 40 mg by mouth daily. 03/06/24  Yes [provider]  Nintedanib (OFEV ) 150 MG CAPS Take 1 capsule (150 mg total) by mouth 2 (two) times daily. 02/03/24  Yes Mannam, Praveen, MD  pantoprazole  (PROTONIX ) 40 MG tablet Take 1 tablet (40 mg total) by mouth 2 (two) times daily. 02/05/24  Yes McMichael, Bayley M, PA-C  predniSONE  (DELTASONE ) 20 MG tablet Take 2 tablets (40 mg total) by mouth daily with breakfast. 03/27/24  Yes McElwee, Lauren A, NP  pregabalin  (LYRICA ) 50 MG capsule Take 1 capsule (50 mg total) by mouth 2 (two) times daily. 08/18/23  Yes Nche, Roselie Rockford, NP  sucralfate  (CARAFATE ) 1 g tablet TAKE 1 TABLET(1 GRAM) BY MOUTH FOUR TIMES DAILY AT BEDTIME WITH MEALS 02/27/24  Yes Craig Palma R, PA-C  albuterol  (VENTOLIN  HFA) 108 (90 Base) MCG/ACT inhaler INHALE 1 TO 2 PUFFS INTO THE LUNGS EVERY 6 HOURS AS NEEDED 01/16/23   Nche, Roselie Rockford, NP  benzonatate  (TESSALON ) 100 MG capsule Take 1 capsule (100 mg total) by mouth 3 (three) times daily as needed for cough.  03/27/24   McElwee, Tinnie LABOR, NP    Current Outpatient Medications  Medication Sig Dispense Refill   acetaminophen  (TYLENOL ) 650 MG CR tablet Take 650 mg by mouth every 8 (eight) hours as needed for pain.     atorvastatin  (LIPITOR) 40 MG tablet Take 1 tablet (40 mg total) by mouth 3 (three) times a week. 36 tablet 3   famotidine  (PEPCID ) 40 MG tablet Take 40 mg by mouth daily.     Nintedanib (OFEV ) 150 MG CAPS Take 1 capsule (150 mg total) by mouth 2 (two) times daily. 180 capsule 1   pantoprazole  (PROTONIX ) 40 MG tablet Take 1 tablet (40 mg total) by mouth 2 (two) times daily. 60 tablet 3   predniSONE  (DELTASONE ) 20 MG tablet Take 2 tablets (40 mg total) by mouth  daily with breakfast. 10 tablet 0   pregabalin  (LYRICA ) 50 MG capsule Take 1 capsule (50 mg total) by mouth 2 (two) times daily. 180 capsule 1   sucralfate  (CARAFATE ) 1 g tablet TAKE 1 TABLET(1 GRAM) BY MOUTH FOUR TIMES DAILY AT BEDTIME WITH MEALS 120 tablet 2   albuterol  (VENTOLIN  HFA) 108 (90 Base) MCG/ACT inhaler INHALE 1 TO 2 PUFFS INTO THE LUNGS EVERY 6 HOURS AS NEEDED 6.7 g 0   benzonatate  (TESSALON ) 100 MG capsule Take 1 capsule (100 mg total) by mouth 3 (three) times daily as needed for cough. 30 capsule 0   Current Facility-Administered Medications  Medication Dose Route Frequency Provider Last Rate Last Admin   0.9 %  sodium chloride  infusion  500 mL Intravenous Once Federico Rosario BROCKS, MD        Allergies as of 05/07/2024 - Review Complete 05/07/2024  Allergen Reaction Noted   Iodine Hives 07/11/2012    Family History  Problem Relation Age of Onset   Arthritis Mother    Thyroid  disease Mother    Heart disease Father    Hypertension Father    Diabetes Father    Arthritis Sister    Diabetes Brother    Colon cancer Neg Hx    Stomach cancer Neg Hx    Rectal cancer Neg Hx    Esophageal cancer Neg Hx     Social History   Socioeconomic History   Marital status: Single    Spouse name: Not on file   Number of children: 1   Years of education: Not on file   Highest education level: 12th grade  Occupational History   Occupation: Biomedical engineer: paperworks  Tobacco Use   Smoking status: Former    Current packs/day: 0.00    Average packs/day: 0.5 packs/day for 35.0 years (17.5 ttl pk-yrs)    Types: Cigarettes    Start date: 04/13/1983    Quit date: 04/12/2018    Years since quitting: 6.0    Passive exposure: Never   Smokeless tobacco: Never  Vaping Use   Vaping status: Never Used  Substance and Sexual Activity   Alcohol use: Not Currently   Drug use: No   Sexual activity: Not Currently    Birth control/protection: Post-menopausal  Other Topics Concern   Not  on file  Social History Narrative   Not on file   Social Drivers of Health   Financial Resource Strain: Low Risk  (03/27/2024)   Overall Financial Resource Strain (CARDIA)    Difficulty of Paying Living Expenses: Not very hard  Food Insecurity: Food Insecurity Present (03/27/2024)   Hunger Vital Sign    Worried About Running Out of  Food in the Last Year: Sometimes true    Ran Out of Food in the Last Year: Sometimes true  Transportation Needs: No Transportation Needs (03/27/2024)   PRAPARE - Administrator, Civil Service (Medical): No    Lack of Transportation (Non-Medical): No  Physical Activity: Insufficiently Active (03/27/2024)   Exercise Vital Sign    Days of Exercise per Week: 1 day    Minutes of Exercise per Session: 10 min  Stress: No Stress Concern Present (03/27/2024)   [employer] of Occupational Health - Occupational Stress Questionnaire    Feeling of Stress: Not at all  Social Connections: Socially Isolated (03/27/2024)   Social Connection and Isolation Panel    Frequency of Communication with Friends and Family: More than three times a week    Frequency of Social Gatherings with Friends and Family: Once a week    Attends Religious Services: Patient declined    Database administrator or Organizations: No    Attends Engineer, structural: Not on file    Marital Status: Never married  Intimate Partner Violence: Not At Risk (01/22/2024)   Humiliation, Afraid, Rape, and Kick questionnaire    Fear of Current or Ex-Partner: No    Emotionally Abused: No    Physically Abused: No    Sexually Abused: No    Physical Exam: Vital signs in last 24 hours: BP 133/69   Pulse 79   Temp 98 F (36.7 C)   Ht 5' (1.524 m)   Wt 134 lb (60.8 kg)   SpO2 97%   BMI 26.17 kg/m  GEN: NAD EYE: Sclerae anicteric ENT: MMM CV: Non-tachycardic Pulm: No increased work of breathing GI: Soft, NT/ND NEURO:  Alert & Oriented   Estefana Kidney, MD [facility]  05/07/2024 9:18 AM

## 2024-05-07 NOTE — Op Note (Signed)
 [facility] Patient Name: Sandra Curtis Procedure Date: 05/07/2024 9:29 AM MRN: [id number] Endoscopist: Rosario Estefana Kidney , , [id number] Age: 68 Referring MD:  Date of Birth: 1956-01-21 Gender: Female Account #: [id number] Procedure:                Upper GI endoscopy Indications:              Dysphagia, Follow-up of esophagitis Medicines:                Monitored Anesthesia Care Procedure:                Pre-Anesthesia Assessment:                           - Prior to the procedure, a History and Physical                            was performed, and patient medications and                            allergies were reviewed. The patient's tolerance of                            previous anesthesia was also reviewed. The risks                            and benefits of the procedure and the sedation                            options and risks were discussed with the patient.                            All questions were answered, and informed consent                            was obtained. Prior Anticoagulants: The patient has                            taken no anticoagulant or antiplatelet agents. ASA                            Grade Assessment: II - A patient with mild systemic                            disease. After reviewing the risks and benefits,                            the patient was deemed in satisfactory condition to                            undergo the procedure.                           After obtaining informed consent, the endoscope was  passed under direct vision. Throughout the                            procedure, the patient's blood pressure, pulse, and                            oxygen saturations were monitored continuously. The                            GIF [id number] #[id number] was introduced through the                            mouth, and advanced to the second part of duodenum.                            The upper  GI endoscopy was accomplished without                            difficulty. The patient tolerated the procedure                            well. Scope In: Scope Out: Findings:                 Two benign-appearing, intrinsic moderate                            (circumferential scarring or stenosis; an endoscope                            may pass) stenoses were found in the proximal and                            distal esophagus. The narrowest stenosis measured 1                            cm (in length). The stenoses were traversed. A                            guidewire was placed and the scope was withdrawn.                            Dilation was performed with a Savary dilator with                            mild resistance at 19 mm.                           The gastroesophageal flap valve was visualized                            endoscopically and classified as Hill Grade IV (no                            fold, wide  open lumen, hiatal hernia present).                           The examined duodenum was normal. Complications:            No immediate complications. Estimated Blood Loss:     Estimated blood loss was minimal. Impression:               - Benign-appearing esophageal stenoses. Dilated.                           - Gastroesophageal flap valve classified as Hill                            Grade IV (no fold, wide open lumen, hiatal hernia                            present).                           - Normal examined duodenum.                           - No specimens collected. Recommendation:           - Discharge patient to home (with escort).                           - Your previous esophagitis has healed.                           - Await pathology results.                           - Return to GI clinic in 2-3 months.                           - The findings and recommendations were discussed                            with the patient. Dr Estefana Federico Rosario Estefana Federico,  05/07/2024 9:46:59 AM

## 2024-05-07 NOTE — Progress Notes (Signed)
 Called to room to assist during endoscopic procedure.  Patient ID and intended procedure confirmed with present staff. Received instructions for my participation in the procedure from the performing physician.

## 2024-05-07 NOTE — Progress Notes (Signed)
 Vss nad trans to pacu

## 2024-05-07 NOTE — Patient Instructions (Addendum)
 Resume previous diet Continue present medications Office follow up with Sandra May NP on 08/05/24 at 10 AM, please call office to reschedule if this time/date does not work for you.  Handouts/information given for dysphagia  YOU HAD AN ENDOSCOPIC PROCEDURE TODAY AT THE [facility]:   Refer to the procedure report that was given to you for any specific questions about what was found during the examination.  If the procedure report does not answer your questions, please call your gastroenterologist to clarify.  If you requested that your care partner not be given the details of your procedure findings, then the procedure report has been included in a sealed envelope for you to review at your convenience later.  YOU SHOULD EXPECT: Some feelings of bloating in the abdomen. Passage of more gas than usual.  Walking can help get rid of the air that was put into your GI tract during the procedure and reduce the bloating. If you had a lower endoscopy (such as a colonoscopy or flexible sigmoidoscopy) you Curtis notice spotting of blood in your stool or on the toilet paper. If you underwent a bowel prep for your procedure, you Curtis not have a normal bowel movement for a few days.  Please Note:  You might notice some irritation and congestion in your nose or some drainage.  This is from the oxygen used during your procedure.  There is no need for concern and it should clear up in a day or so.  SYMPTOMS TO REPORT IMMEDIATELY:  Following upper endoscopy (EGD)  Vomiting of blood or coffee ground material  New chest pain or pain under the shoulder blades  Painful or persistently difficult swallowing  New shortness of breath  Fever of 100F or higher  Black, tarry-looking stools For urgent or emergent issues, a gastroenterologist can be reached at any hour by calling [phone]. Do not use MyChart messaging for urgent concerns.   DIET:  We do recommend a small meal at first, but then you Curtis  proceed to your regular diet.  Drink plenty of fluids but you should avoid alcoholic beverages for 24 hours.  ACTIVITY:  You should plan to take it easy for the rest of today and you should NOT DRIVE or use heavy machinery until tomorrow (because of the sedation medicines used during the test).    FOLLOW UP: Our staff will call the number listed on your records the next business day following your procedure.  We will call around 7:15- 8:00 am to check on you and address any questions or concerns that you Curtis have regarding the information given to you following your procedure. If we do not reach you, we will leave a message.      SIGNATURES/CONFIDENTIALITY: You and/or your care partner have signed paperwork which will be entered into your electronic medical record.  These signatures attest to the fact that that the information above on your After Visit Summary has been reviewed and is understood.  Full responsibility of the confidentiality of this discharge information lies with you and/or your care-partner.

## 2024-05-07 NOTE — Progress Notes (Signed)
 Pt's states no medical or surgical changes since previsit or office visit.

## 2024-05-08 ENCOUNTER — Telehealth: Payer: Self-pay

## 2024-05-08 NOTE — Telephone Encounter (Signed)
  Follow up Call-     05/07/2024    9:04 AM 02/07/2024    1:15 PM 08/24/2023    9:55 AM 07/05/2022    3:24 PM  Call back number  Post procedure Call Back phone  # [phone]  Permission to leave phone message Yes Yes Yes Yes     Patient questions:  Do you have a fever, pain , or abdominal swelling? No. Pain Score  0 *  Have you tolerated food without any problems? Yes.    Have you been able to return to your normal activities? Yes.    Do you have any questions about your discharge instructions: Diet   No. Medications  No. Follow up visit  No.  Do you have questions or concerns about your Care? No.  Actions: * If pain score is 4 or above: No action needed, pain <4.

## 2024-05-15 ENCOUNTER — Telehealth: Payer: Self-pay

## 2024-05-15 DIAGNOSIS — J84112 Idiopathic pulmonary fibrosis: Secondary | ICD-10-CM

## 2024-05-15 DIAGNOSIS — Z5181 Encounter for therapeutic drug level monitoring: Secondary | ICD-10-CM

## 2024-05-15 NOTE — Telephone Encounter (Unsigned)
 Copied from CRM #[id number]. Topic: Clinical - Medication Refill >> May 15, 2024 10:14 AM Leila BROCKS wrote: Patient [phone] states is almost out of Ofev , opened the last bottle today, there's 50 pills in the bottle. Patient needs to whom to contact to refill Ofev , needs guidance does not want to run out of medication. Please advise and call back.

## 2024-05-16 NOTE — Telephone Encounter (Signed)
 Contacted patient to discuss. Verified name and DOB.   She verifies that she has enough supply of Ofev  remaining for approximately 25 days. She verifies adherence to appropriate dosing.   She is due for labs - labs ordered and scheduled lab visit for 05/21/24.   Pharmacy team to alert patient of lab results. Will place refill pending lab results.   Pt verbalizes understanding and agreement with plan.  Aleck Puls, PharmD, BCPS Clinical Pharmacist - Pulmonology

## 2024-05-20 NOTE — Addendum Note (Signed)
 Addended by: Kwali Wrinkle C on: 05/20/2024 08:44 AM   Modules accepted: Orders

## 2024-05-21 ENCOUNTER — Other Ambulatory Visit

## 2024-05-27 ENCOUNTER — Ambulatory Visit: Payer: Self-pay | Admitting: Pharmacist

## 2024-05-27 ENCOUNTER — Other Ambulatory Visit

## 2024-05-27 DIAGNOSIS — Z5181 Encounter for therapeutic drug level monitoring: Secondary | ICD-10-CM

## 2024-05-27 DIAGNOSIS — J84112 Idiopathic pulmonary fibrosis: Secondary | ICD-10-CM

## 2024-05-27 LAB — HEPATIC FUNCTION PANEL
ALT: 95 U/L — ABNORMAL HIGH (ref 0–35)
AST: 118 U/L — ABNORMAL HIGH (ref 0–37)
Albumin: 4.3 g/dL (ref 3.5–5.2)
Alkaline Phosphatase: 65 U/L (ref 39–117)
Bilirubin, Direct: 0.1 mg/dL (ref 0.0–0.3)
Total Bilirubin: 0.4 mg/dL (ref 0.2–1.2)
Total Protein: 7.7 g/dL (ref 6.0–8.3)

## 2024-05-27 NOTE — Addendum Note (Signed)
 Addended by: DAYNE SHERRY RAMAN on: 05/27/2024 02:33 PM   Modules accepted: Orders

## 2024-05-27 NOTE — Progress Notes (Signed)
 LFTs have increased >3x ULN. Hold Ofev . Patient increased to 150mg  twice daily after after last visit. She denies any changes to medications. Denies increase in Tylenol  or alcohol use. Previously on Ofev  100mg  twice daily with no issues.  Called patient- HOLD Ofev  x 2 weeks. Repeat labs on 06/11/2024 (appt made). If normalized LFTs, will plan to rechallenge with Ofev  100mg  twice daily

## 2024-06-11 ENCOUNTER — Other Ambulatory Visit

## 2024-06-11 DIAGNOSIS — Z5181 Encounter for therapeutic drug level monitoring: Secondary | ICD-10-CM

## 2024-06-11 DIAGNOSIS — J84112 Idiopathic pulmonary fibrosis: Secondary | ICD-10-CM

## 2024-06-11 LAB — HEPATIC FUNCTION PANEL
ALT: 12 U/L (ref 0–35)
AST: 27 U/L (ref 0–37)
Albumin: 4.1 g/dL (ref 3.5–5.2)
Alkaline Phosphatase: 39 U/L (ref 39–117)
Bilirubin, Direct: 0 mg/dL (ref 0.0–0.3)
Total Bilirubin: 0.4 mg/dL (ref 0.2–1.2)
Total Protein: 7.6 g/dL (ref 6.0–8.3)

## 2024-06-12 ENCOUNTER — Ambulatory Visit: Payer: Self-pay

## 2024-06-12 MED ORDER — OFEV 100 MG PO CAPS: 100.0000 mg | ORAL_CAPSULE | Freq: Two times a day (BID) | ORAL | 1 refills | Status: DC

## 2024-06-12 NOTE — Addendum Note (Signed)
 Addended by: Toy Samarin L on: 06/12/2024 02:20 PM   Modules accepted: Orders

## 2024-06-12 NOTE — Addendum Note (Signed)
 Addended by: Asaph Serena L on: 06/12/2024 02:22 PM   Modules accepted: Orders

## 2024-06-12 NOTE — Telephone Encounter (Signed)
 Repeat LFTs wnl. Restart Ofev  100mg  q12h. Rx sent to [facility]. Plan to repeat LFTs in 2 weeks.   Aleck Puls, PharmD, BCPS Clinical Pharmacist  [facility]

## 2024-06-14 ENCOUNTER — Other Ambulatory Visit: Payer: Self-pay | Admitting: Nurse Practitioner

## 2024-06-14 DIAGNOSIS — M51372 Other intervertebral disc degeneration, lumbosacral region with discogenic back pain and lower extremity pain: Secondary | ICD-10-CM

## 2024-06-14 DIAGNOSIS — Z8781 Personal history of (healed) traumatic fracture: Secondary | ICD-10-CM

## 2024-06-14 DIAGNOSIS — G8929 Other chronic pain: Secondary | ICD-10-CM

## 2024-06-14 NOTE — Telephone Encounter (Signed)
 FYI

## 2024-06-14 NOTE — Telephone Encounter (Signed)
 Requesting: Pregabalin  (Lyrica ) 50 mg  Last Visit: 04/03/2024 Next Visit: 08/26/2024 Last Refill: 08/18/23  Please Advise PDMP Review May Be Needed Last reviewed by Katheen Roselie Rockford, NP on 09/22/2023 at 2:37 PM

## 2024-06-27 DIAGNOSIS — N1832 Chronic kidney disease, stage 3b: Secondary | ICD-10-CM | POA: Diagnosis not present

## 2024-06-27 DIAGNOSIS — Z008 Encounter for other general examination: Secondary | ICD-10-CM | POA: Diagnosis not present

## 2024-07-01 NOTE — Addendum Note (Signed)
 Addended by: Nafisa Olds C on: 07/01/2024 04:56 PM   Modules accepted: Orders

## 2024-07-02 ENCOUNTER — Other Ambulatory Visit

## 2024-07-02 DIAGNOSIS — Z5181 Encounter for therapeutic drug level monitoring: Secondary | ICD-10-CM

## 2024-07-02 DIAGNOSIS — J84112 Idiopathic pulmonary fibrosis: Secondary | ICD-10-CM

## 2024-07-02 LAB — HEPATIC FUNCTION PANEL
ALT: 15 U/L (ref 0–35)
AST: 29 U/L (ref 0–37)
Albumin: 4.3 g/dL (ref 3.5–5.2)
Alkaline Phosphatase: 40 U/L (ref 39–117)
Bilirubin, Direct: 0.1 mg/dL (ref 0.0–0.3)
Total Bilirubin: 0.3 mg/dL (ref 0.2–1.2)
Total Protein: 7.6 g/dL (ref 6.0–8.3)

## 2024-07-03 ENCOUNTER — Other Ambulatory Visit: Payer: Self-pay

## 2024-07-03 DIAGNOSIS — Z5181 Encounter for therapeutic drug level monitoring: Secondary | ICD-10-CM

## 2024-07-03 NOTE — Progress Notes (Signed)
 Hepatic function panel ordered (next due end of Dec 2025).   Aleck Puls, PharmD, BCPS Clinical Pharmacist  [facility]

## 2024-07-14 ENCOUNTER — Other Ambulatory Visit: Payer: Self-pay | Admitting: Gastroenterology

## 2024-08-01 ENCOUNTER — Ambulatory Visit
Admission: RE | Admit: 2024-08-01 | Discharge: 2024-08-01 | Disposition: A | Discharge status: Home / Self Care | Source: Ambulatory Visit | Attending: Pulmonary Disease | Admitting: Pulmonary Disease

## 2024-08-01 DIAGNOSIS — J84112 Idiopathic pulmonary fibrosis: Secondary | ICD-10-CM

## 2024-08-05 ENCOUNTER — Encounter: Payer: Self-pay | Admitting: Gastroenterology

## 2024-08-05 ENCOUNTER — Ambulatory Visit: Admitting: Gastroenterology

## 2024-08-05 VITALS — BP 110/64 | HR 93 | Ht 60.0 in | Wt 136.0 lb

## 2024-08-05 DIAGNOSIS — K449 Diaphragmatic hernia without obstruction or gangrene: Secondary | ICD-10-CM

## 2024-08-05 DIAGNOSIS — Z1211 Encounter for screening for malignant neoplasm of colon: Secondary | ICD-10-CM | POA: Diagnosis not present

## 2024-08-05 DIAGNOSIS — K2100 Gastro-esophageal reflux disease with esophagitis, without bleeding: Secondary | ICD-10-CM

## 2024-08-05 DIAGNOSIS — Z1212 Encounter for screening for malignant neoplasm of rectum: Secondary | ICD-10-CM

## 2024-08-05 DIAGNOSIS — R1310 Dysphagia, unspecified: Secondary | ICD-10-CM

## 2024-08-05 MED ORDER — LANSOPRAZOLE 15 MG PO CPDR: 30.0000 mg | DELAYED_RELEASE_CAPSULE | Freq: Two times a day (BID) | ORAL | 0 refills | Status: DC

## 2024-08-05 NOTE — Patient Instructions (Addendum)
 Switch Pantoprazole  to lansoprazole 15mg  po twice daily continue Carafate   continue famotidine  40 mg at bedtime  When ready to schedule colonoscopy call the office   _______________________________________________________  If your blood pressure at your visit was 140/90 or greater, please contact your primary care physician to follow up on this.  _______________________________________________________  If you are age 68 or older, your body mass index should be between 23-30. Your Body mass index is 26.56 kg/m. If this is out of the aforementioned range listed, please consider follow up with your Primary Care Provider.  If you are age 37 or younger, your body mass index should be between 19-25. Your Body mass index is 26.56 kg/m. If this is out of the aformentioned range listed, please consider follow up with your Primary Care Provider.   ________________________________________________________  The  GI providers would like to encourage you to use MYCHART to communicate with providers for non-urgent requests or questions.  Due to long hold times on the telephone, sending your provider a message by [facility] may be a faster and more efficient way to get a response.  Please allow 48 business hours for a response.  Please remember that this is for non-urgent requests.  _______________________________________________________  [facility] is using a team-based approach to care.  Your team is made up of your doctor and two to three APPS. Our APPS (Nurse Practitioners and Physician Assistants) work with your physician to ensure care continuity for you. They are fully qualified to address your health concerns and develop a treatment plan. They communicate directly with your gastroenterologist to care for you. Seeing the Advanced Practice Practitioners on your physician's team can help you by facilitating care more promptly, often allowing for earlier appointments, access to diagnostic  testing, procedures, and other specialty referrals.   Thank you for trusting me with your gastrointestinal care. Deanna May, FNP-C

## 2024-08-05 NOTE — Progress Notes (Signed)
 Chief Complaint: follow-up GERD and dysphagia Primary GI Doctor: Dr. Federico  HPI:  Patient is a  68  year old female patient with past medical history of GERD with hiatal hernia, who presents for follow-up on GERD.   Patient last seen in GI office on 02/05/24 by [facility] for recurrent dysphagia and sore throat. EGD scheduled.  Interval History  Patient presents for follow-up on GERD and dysphagia. Patient has history of GERD with large HH. Patient presents with persistent pyrosis and regurgitation. She is currently taking pantoprazole  40 Mg twice daily, Carafate  4 times daily, and famotidine  40 mg at bedtime.  She reports intermittent oropharyngeal dysphagia, no improvement with dilatation. Recommendations for surgical correction if no improvement which we discussed today.  Also overdue for colon screening colonoscopy which we also discussed today. No abdominal pain, altered bowel habits, or blood in stool .    PREVIOUS GI WORKUP   EGD 05/07/24  - Benign- appearing esophageal stenoses. Dilated.  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  - Normal examined duodenum.  - No specimens collected.  EGD 02/07/24  - LA Grade C esophagitis with no bleeding. Biopsied.  - Large hiatal hernia.  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  - Normal examined duodenum.  EGD 08/24/2023 for worsening GERD and dysphagia  - Mildly tortuous esophagus.  - Benign- appearing esophageal stenosis. Dilated. - Medium- sized hiatal hernia.  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  - A single duodenal polyp. Resected and retrieved.   EGD Sept 2023 - Benign-appearing esophageal stenosis. Dilated.  - Medium-sized hiatal hernia.  - Normal duodenal bulb and second portion of the duodenum.  - No  specimens collected.   July 2024 echo shows EF=60=65%    Colonoscopy 12/2013 for heme positive stool - Normal colon - Small internal hemorrhoids - Repeat 12/2023    Wt Readings from Last 3 Encounters:  08/05/24 136 lb (61.7 kg)  05/07/24 134 lb (60.8 kg)  04/03/24 134 lb 12.8 oz (61.1 kg)    Past Medical History:  Diagnosis Date   Allergic rhinitis    Allergy    Chronic back pain 07/20/2018   Chronic midline low back pain 02/06/2017   Depression    Esophageal stricture    GERD (gastroesophageal reflux disease)    Inflammatory polyps of colon (HCC) 2010   Pulmonary filariasis (HCC)    Systolic murmur    UTI (urinary tract infection)     Past Surgical History:  Procedure Laterality Date   BREAST BIOPSY Left 01/22/2021   COLONOSCOPY     UPPER GASTROINTESTINAL ENDOSCOPY      Current Outpatient Medications  Medication Sig Dispense Refill   acetaminophen  (TYLENOL ) 650 MG CR tablet Take 650 mg by mouth every 8 (eight) hours as needed for pain.     albuterol  (VENTOLIN  HFA) 108 (90 Base) MCG/ACT inhaler INHALE 1 TO 2 PUFFS INTO THE LUNGS EVERY 6 HOURS AS NEEDED 6.7 g 0   atorvastatin  (LIPITOR) 40 MG tablet Take 1 tablet (40 mg total) by mouth 3 (three) times a week. 36 tablet 3   benzonatate  (TESSALON ) 100 MG capsule Take 1 capsule (100 mg total) by mouth 3 (three) times daily as needed for cough. (Patient taking differently: Take 100 mg by mouth 3 (three)  times daily as needed for cough. As needed) 30 capsule 0   famotidine  (PEPCID ) 40 MG tablet Take 40 mg by mouth daily.     lansoprazole (PREVACID) 15 MG capsule Take 2 capsules (30 mg total) by mouth 2 (two) times daily before a meal. 120 capsule 0   Nintedanib (OFEV ) 100 MG CAPS Take 1 capsule (100 mg total) by mouth 2 (two) times daily. 60 capsule 1   pregabalin  (LYRICA ) 50 MG capsule TAKE 1 CAPSULE(50 MG) BY MOUTH TWICE DAILY 180 capsule 1   sucralfate  (CARAFATE ) 1 g tablet TAKE 1 TABLET(1 GRAM) BY MOUTH FOUR TIMES DAILY AT  BEDTIME WITH MEALS 120 tablet 2   predniSONE  (DELTASONE ) 20 MG tablet Take 2 tablets (40 mg total) by mouth daily with breakfast. 10 tablet 0   No current facility-administered medications for this visit.    Allergies as of 08/05/2024 - Review Complete 08/05/2024  Allergen Reaction Noted   Iodine Hives 07/11/2012    Family History  Problem Relation Age of Onset   Arthritis Mother    Thyroid  disease Mother    Heart disease Father    Hypertension Father    Diabetes Father    Arthritis Sister    Diabetes Brother    Colon cancer Neg Hx    Stomach cancer Neg Hx    Rectal cancer Neg Hx    Esophageal cancer Neg Hx     Review of Systems:    Constitutional: No weight loss, fever, chills, weakness or fatigue HEENT: Eyes: No change in vision               Ears, Nose, Throat:  No change in hearing or congestion Skin: No rash or itching Cardiovascular: No chest pain, chest pressure or palpitations   Respiratory: No SOB or cough Gastrointestinal: See HPI and otherwise negative Genitourinary: No dysuria or change in urinary frequency Neurological: No headache, dizziness or syncope Musculoskeletal: No new muscle or joint pain Hematologic: No bleeding or bruising Psychiatric: No history of depression or anxiety    Physical Exam:  Vital signs: BP 110/64   Pulse 93   Ht 5' (1.524 m)   Wt 136 lb (61.7 kg)   BMI 26.56 kg/m   Constitutional:   Pleasant female appears to be in NAD, Well developed, Well nourished, alert and cooperative Throat: Oral cavity and pharynx without inflammation, swelling or lesion.  Respiratory: Respirations even and unlabored.wheezing noted bilaterally.   No crackles.  Cardiovascular: Normal S1, S2. Regular rate and rhythm. No peripheral edema, cyanosis or pallor.  Gastrointestinal:  Soft, nondistended, nontender. No rebound or guarding. Normal bowel sounds. No appreciable masses or hepatomegaly. Rectal:  Not performed.  Msk:  Symmetrical without gross  deformities. Without edema, no deformity or joint abnormality.  Neurologic:  Alert and  oriented x4;  grossly normal neurologically.  Skin:   Dry and intact without significant lesions or rashes.  RELEVANT LABS AND IMAGING: CBC    Latest Ref Rng & Units 08/22/2023   10:42 AM 08/24/2022   11:04 AM 08/27/2021    2:35 PM  CBC  WBC 4.0 - 10.5 K/uL 5.4  6.7  6.8   Hemoglobin 12.0 - 15.0 g/dL 89.4  89.8  89.4   Hematocrit 36.0 - 46.0 % 31.9  30.6  32.3   Platelets 150.0 - 400.0 K/uL 235.0  223.0  251.0      CMP     Latest Ref Rng & Units 07/02/2024    1:48 PM 06/11/2024  11:01 AM 05/27/2024   10:07 AM  CMP  Total Protein 6.0 - 8.3 g/dL 7.6  7.6  7.7   Total Bilirubin 0.2 - 1.2 mg/dL 0.3  0.4  0.4   Alkaline Phos 39 - 117 U/L 40  39  65   AST 0 - 37 U/L 29  27  118   ALT 0 - 35 U/L 15  12  95      Lab Results  Component Value Date   TSH 0.66 08/24/2022     Assessment: Encounter Diagnoses  Name Primary?   Gastroesophageal reflux disease with esophagitis without hemorrhage Yes   Dysphagia, unspecified type    Hiatal hernia    Screening for colorectal cancer    68 year old female patient with history of GERD with esophageal dysphagia.  EGD April 2025 showed esophagitis, repeat EGD 2 months later that showed mucosal healing on current regimen.  Patient was dilated at that time with no improvement in dysphagia.  Patient also on max reflux therapy with persistent symptoms of pyrosis and regurgitation.   We discussed pursuing surgical consult to correct large hiatal hernia.  Also switch the pantoprazole  to lansoprazole along with the Carafate  and famotidine  to see if this provides her with relief  Colon cancer screening Last colonoscopy in 2015, normal, repeat 12/2023.  Patient not interested in colonoscopy at this time. Will call office to schedule.  Plan: - Switch Pantoprazole  to lansoprazole 15 mg po twice daily -continue Carafate  as prescribed -continue famotidine  40 mg at  bedtime -refer to general surgery [facility] for consideration of hiatal hernia repair since patient is already maximized on reflux therapies. -we discussed scheduling colonoscopy when she is ready, will discuss at follow-up -Follow-up in January with Dr. Federico   Thank you for the courtesy of this consult. Please call me with any questions or concerns.   Ching Rabideau, FNP-C [facility] 08/05/2024, 11:10 AM  Cc: Katheen Roselie Rockford, NP

## 2024-08-08 ENCOUNTER — Ambulatory Visit: Payer: Self-pay | Admitting: Pulmonary Disease

## 2024-08-12 ENCOUNTER — Other Ambulatory Visit: Payer: Self-pay | Admitting: General Surgery

## 2024-08-12 ENCOUNTER — Encounter: Payer: Self-pay | Admitting: Radiology

## 2024-08-12 DIAGNOSIS — K2100 Gastro-esophageal reflux disease with esophagitis, without bleeding: Secondary | ICD-10-CM

## 2024-08-12 DIAGNOSIS — K449 Diaphragmatic hernia without obstruction or gangrene: Secondary | ICD-10-CM

## 2024-08-15 ENCOUNTER — Ambulatory Visit (INDEPENDENT_AMBULATORY_CARE_PROVIDER_SITE_OTHER): Admitting: Pulmonary Disease

## 2024-08-15 ENCOUNTER — Encounter: Payer: Self-pay | Admitting: Pulmonary Disease

## 2024-08-15 VITALS — BP 133/70 | HR 70 | Temp 98.1°F | Ht 60.0 in | Wt 135.0 lb

## 2024-08-15 DIAGNOSIS — Z5181 Encounter for therapeutic drug level monitoring: Secondary | ICD-10-CM

## 2024-08-15 DIAGNOSIS — K449 Diaphragmatic hernia without obstruction or gangrene: Secondary | ICD-10-CM | POA: Diagnosis not present

## 2024-08-15 DIAGNOSIS — K219 Gastro-esophageal reflux disease without esophagitis: Secondary | ICD-10-CM | POA: Diagnosis not present

## 2024-08-15 DIAGNOSIS — N189 Chronic kidney disease, unspecified: Secondary | ICD-10-CM

## 2024-08-15 DIAGNOSIS — Z01811 Encounter for preprocedural respiratory examination: Secondary | ICD-10-CM | POA: Diagnosis not present

## 2024-08-15 DIAGNOSIS — R1310 Dysphagia, unspecified: Secondary | ICD-10-CM

## 2024-08-15 DIAGNOSIS — F17211 Nicotine dependence, cigarettes, in remission: Secondary | ICD-10-CM

## 2024-08-15 DIAGNOSIS — J84112 Idiopathic pulmonary fibrosis: Secondary | ICD-10-CM

## 2024-08-15 NOTE — Patient Instructions (Signed)
  VISIT SUMMARY: Today, we reviewed your pulmonary fibrosis and gastroesophageal reflux disease (GERD) with dysphagia. Your current medications and their effectiveness were discussed, and we planned for further evaluations and potential surgery.  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 Your condition is stable on your current medication, nintedanib. -Continue taking nintedanib 100 mg twice daily. -We will evaluate your pulmonary risk for the upcoming esophageal surgery. -We will hold nintedanib for a few weeks during surgery due to potential gastrointestinal side effects.  GASTROESOPHAGEAL REFLUX DISEASE (GERD) WITH HIATAL HERNIA AND DYSPHAGIA: You have GERD with a medium-sized hiatal hernia and dysphagia, causing burning sensations. -Proceed with diagnostic imaging for esophageal evaluation. -Follow up with Dr. Stevie for surgical evaluation and management.  DYSPNEA: You experience shortness of breath during exertion, managed with an albuterol  inhaler. -Your albuterol  inhaler prescription has been refilled.

## 2024-08-15 NOTE — Progress Notes (Signed)
 Sandra Curtis    [id number]    September 28, 1956  Primary Care Physician:Nche, Roselie Rockford, NP  Referring Physician: Katheen Roselie Rockford, NP [address] [place],  [place] [address]  Chief complaint: Follow-up for IPF Did not tolerate Esbriet  Started Ofev  April 2022.  Did not tolerate higher dose of 150 mg twice daily in 2025  HPI: 68 y.o.  with history of allergies, GERD, esophageal stricture Complains of chronic cough for several years, worsening dyspnea on exertion. She had a CT scan done by Dr. Katheen, primary care showing pulmonary fibrosis and has been referred for further evaluation.  Followed by Dr. Aneita, GI with EGD in 2022 showing esophageal stricture secondary to acid reflux.   Has also seen Dr. Jeannetta, rheumatology for evaluation of elevated ANA and SSA.  He feels she does not have any signs of autoimmune disease.  Discussed at multidisciplinary conference in December 2021 with diagnosis of IPF Started Esbriet  in December 2021.  But did not tolerate even at a low dose due to dizziness and headache. Antifibrotic switched to Ofev  in April 2022 She is tolerating this better  She has a new diagnosis of CKD and is now followed by Dr. Ephriam Stank,  [facility]  Interval history: Discussed the use of AI scribe software for clinical note transcription with the patient, who gave verbal consent to proceed.  History of Present Illness  Interval history: Sandra Curtis is a 68 year old female with pulmonary fibrosis who presents for follow-up of her condition.  Pulmonary fibrosis - Pulmonary fibrosis managed with nintedanib.  Dose was increased to 150 mg twice daily in April 2025 but she had elevation hepatic panel and was taken off medication for couple of weeks and reintroduced at 100 mg twice daily - Liver enzymes have normalized since dose adjustment - Tolerates current dose well without diarrhea - Respiratory symptoms remain  stable with no significant changes in breathing - No requirement for supplemental oxygen - Uses albuterol  as needed for exertional dyspnea  Gastroesophageal reflux and dysphagia - GERD and dysphagia present, associated with hiatal hernia - Persistent burning sensations   Relevant pulmonary history Pets: No pets Occupation: Worked in a education officer, 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  No mold, hot tub, Jacuzzi.  No feather pillows or comforters Smoking history: 18-pack-year smoker.  Quit 1 month ago Travel history: No significant travel history Relevant family history: No significant family issue of lung disease  Outpatient Encounter Medications as of 08/15/2024  Medication Sig   acetaminophen  (TYLENOL ) 650 MG CR tablet Take 650 mg by mouth every 8 (eight) hours as needed for pain.   albuterol  (VENTOLIN  HFA) 108 (90 Base) MCG/ACT inhaler INHALE 1 TO 2 PUFFS INTO THE LUNGS EVERY 6 HOURS AS NEEDED   atorvastatin  (LIPITOR) 40 MG tablet Take 1 tablet (40 mg total) by mouth 3 (three) times a week.   benzonatate  (TESSALON ) 100 MG capsule Take 1 capsule (100 mg total) by mouth 3 (three) times daily as needed for cough.   famotidine  (PEPCID ) 40 MG tablet Take 40 mg by mouth daily.   Nintedanib (OFEV ) 100 MG CAPS Take 1 capsule (100 mg total) by mouth 2 (two) times daily.   pregabalin  (LYRICA ) 50 MG capsule TAKE 1 CAPSULE(50 MG) BY MOUTH TWICE DAILY   sucralfate  (CARAFATE ) 1 g tablet TAKE 1 TABLET(1 GRAM) BY MOUTH FOUR TIMES DAILY AT BEDTIME WITH  MEALS   lansoprazole (PREVACID) 15 MG capsule Take 2 capsules (30 mg total) by mouth 2 (two) times daily before a meal. (Patient not taking: Reported on 08/15/2024)   predniSONE  (DELTASONE ) 20 MG tablet Take 2 tablets (40 mg total) by mouth daily with breakfast.   No facility-administered encounter medications on file as of 08/15/2024.   Vitals:   08/15/24 1030  BP: 133/70   Pulse: 70  Temp: 98.1 F (36.7 C)  Height: 5' (1.524 m)  Weight: 135 lb (61.2 kg)  SpO2: 97%  TempSrc: Oral  BMI (Calculated): 26.37     Physical Exam GEN: No acute distress CV: Regular rate and rhythm no murmurs LUNGS: Clear to auscultation bilaterally normal respiratory effort SKIN JOINTS: Warm and dry no rash    Data Reviewed: Imaging: Chest x-ray 05/15/2020-diffuse interstitial opacities CT chest 06/17/2020-reticulation with traction bronchiectasis, honeycombing most prominent in the right lung base.  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 High-resolution CT 11/02/2021-stable findings of UIP pattern pulmonary fibrosis High resolution CT 07/16/2022-stable findings of UIP pattern pulmonary fibrosis. High resolution CT 08/02/2023-UIP pattern of pulmonary fibrosis appears stable. High-res CT 08/02/2024-stable pattern of pulmonary fibrosis. I have reviewed the images personally.  PFTs: 09/09/2020 FVC 1.80 [85%], FEV1 1.55 [94%], F/F 86, TLC 3.05 [60%], DLCO 10.61 [61%] Mild-moderate restriction and diffusion defect  08/17/2022 FVC 1.74 [65%], FEV1 1.50 [74%], F/F 86, TLC 3.70 [83%], DLCO 9.83 [57%]  01/30/2024 FVC 1.66 [63%], FEV1 1.41 [100%], F/F85, TLC 3.04 [68%], DLCO 8.28 [47%]   Labs: ANA 06/27/2019 1:40, cytoplasmic, rheumatoid factor less than 14 ANA 07/16/2020-1:40, SSA greater than 8  Hepatic panel 07/02/2024-within normal limits  Cardiac: Echocardiogram 04/17/2023-LVEF 60 to 65%, grade 1 diastolic dysfunction, normal RV systolic size and function.  PA pressure is normal. Assessment & Plan Idiopathic Pulmonary Fibrosis CT with UIP pattern fibrosis. She does have mild arthritis symptoms which may be secondary to osteoarthritis but does endorse some morning stiffness and small joint pain.  She had some occupational exposures to textile fiber and dust but no asbestos. Her exposures are not a cause of the pulmonary finrosis No evidence of autoimmune process, connective tissue disease  per rheumatology evaluation.  Case discussed at multidisciplinary conference in dec 2021 and diagnosis felt to be consistent with IPF Unfortunately she has not tolerated Esbriet  even at a low dose of 1 tablet 3 times a day.  She did not tolerate Ofev  150 mg twice daily as well due to elevated LFTs and is now on Ofev  at 100 mg twice daily. Finished pulm rehab.  No evidence of pulmonary hypertension on echocardiogram We discussed referral to lung transplantation but she would like to hold off for now  CT scan shows stable disease, indicating effective management at current dose. Discussed potential risk of surgery due to reduced lung function, but deemed low risk and worthwhile for potential benefit in preventing further scarring. - Continue nintedanib 100 mg twice daily.  Continue to monitor liver panel - Will evaluate pulmonary risk for upcoming esophageal surgery. - Will hold nintedanib for a few weeks during surgery due to potential gastrointestinal side effects.  Gastroesophageal reflux disease with hiatal hernia and dysphagia GERD with medium-sized hiatal hernia and dysphagia. Symptoms include burning sensation, potentially exacerbating pulmonary fibrosis. Referred to surgery for evaluation and potential intervention. Discussed potential benefit of surgery in reducing reflux and preventing further lung scarring. - Proceed with diagnostic imaging for esophageal evaluation. - Follow up with Dr. Stevie for surgical evaluation and management.  Chronic Kidney Disease,  unspecified Chronic kidney disease, unspecified.  She follows up with nephrology.  Peri-operative Assessment of Pulmonary Risk for Non-Thoracic Surgery:  Asked to provide pulmonary risk assessment for planned hiatal hernia repair  By ARISCAT criteria patient has   Low risk 1.6% risk of in-hospital post-op pulmonary complications (composite including respiratory failure, respiratory infection, pleural effusion, atelectasis,  pneumothorax, bronchospasm, aspiration pneumonitis)   ForMs. Lewis, risk of perioperative pulmonary complications is increased by:  Age greater than 65 years  ILD   Respiratory complications generally occur in 1% of ASA Class I patients, 5% of ASA Class II and 10% of ASA Class III-IV patients These complications rarely result in mortality and iclude postoperative pneumonia, atelectasis, pulmonary embolism, ARDS and increased time requiring postoperative mechanical ventilation.  Overall, I recommend proceeding with the surgery if the risk for respiratory complications are outweighed by the potential benefits. This will need to be discussed between the patient and surgeon.  To reduce risks of respiratory complications, I recommend: --Pre- and post-operative incentive spirometry performed frequently while awake --Avoiding use of pancuronium during anesthesia.  I have discussed the risk factors and recommendations above with the patient.   Plan/Recommendations: Continue Ofev  at 100 mg twice daily Monitor liver panel Risk assessment for planned hiatal hernia repair  I personally spent a total of 45 minutes in the care of the patient today including preparing to see the patient, getting/reviewing separately obtained history, counseling and educating, referring and communicating with other health care professionals, documenting clinical information in the EHR, independently interpreting results, and communicating results.   Lonna Coder MD [facility] 08/15/2024, 10:47 AM  CC: Nche, Roselie Rockford, NP

## 2024-08-20 LAB — COMPREHENSIVE METABOLIC PANEL WITH GFR
Albumin: 4.4 (ref 3.5–5.0)
Calcium: 9.3 (ref 8.7–10.7)
Globulin: 2.9
eGFR: 54

## 2024-08-20 LAB — BASIC METABOLIC PANEL WITH GFR
BUN: 10 (ref 4–21)
CO2: 28 — AB (ref 13–22)
Chloride: 108 (ref 99–108)
Creatinine: 1.1 (ref 0.5–1.1)
Glucose: 93
Potassium: 4 meq/L (ref 3.5–5.1)
Sodium: 143 (ref 137–147)

## 2024-08-20 LAB — HEPATIC FUNCTION PANEL
ALT: 14 U/L (ref 7–35)
AST: 33 (ref 13–35)
Bilirubin, Total: 0.3

## 2024-08-20 LAB — PROTEIN / CREATININE RATIO, URINE: Albumin, U: 6.7

## 2024-08-20 LAB — MICROALBUMIN / CREATININE URINE RATIO: Microalb Creat Ratio: 19

## 2024-08-20 LAB — MICROALBUMIN, URINE: Microalb, Ur: 34.7

## 2024-08-26 ENCOUNTER — Telehealth: Payer: Self-pay | Admitting: Pulmonary Disease

## 2024-08-26 ENCOUNTER — Ambulatory Visit: Admitting: Nurse Practitioner

## 2024-08-26 ENCOUNTER — Encounter: Payer: Self-pay | Admitting: Nurse Practitioner

## 2024-08-26 VITALS — BP 138/74 | HR 77 | Temp 98.1°F | Ht 60.0 in | Wt 135.8 lb

## 2024-08-26 DIAGNOSIS — Z23 Encounter for immunization: Secondary | ICD-10-CM

## 2024-08-26 DIAGNOSIS — J84112 Idiopathic pulmonary fibrosis: Secondary | ICD-10-CM

## 2024-08-26 DIAGNOSIS — E1169 Type 2 diabetes mellitus with other specified complication: Secondary | ICD-10-CM | POA: Diagnosis not present

## 2024-08-26 DIAGNOSIS — N1831 Chronic kidney disease, stage 3a: Secondary | ICD-10-CM

## 2024-08-26 DIAGNOSIS — E785 Hyperlipidemia, unspecified: Secondary | ICD-10-CM

## 2024-08-26 DIAGNOSIS — I700 Atherosclerosis of aorta: Secondary | ICD-10-CM

## 2024-08-26 LAB — POCT GLYCOSYLATED HEMOGLOBIN (HGB A1C): Hemoglobin A1C: 5.7 % — AB (ref 4.0–5.6)

## 2024-08-26 MED ORDER — ATORVASTATIN CALCIUM 40 MG PO TABS: 40.0000 mg | ORAL_TABLET | ORAL | 3 refills | Status: AC

## 2024-08-26 NOTE — Assessment & Plan Note (Addendum)
 LDL not at goal  Current use of atorvastatin  40mg  3x/week Unable to tolerate high dose. Repeat labs in 3months (fasting)

## 2024-08-26 NOTE — Assessment & Plan Note (Signed)
 Diet controlled Repeat hgbA1c-5.7% improved Normal UACr Requested eye exam report

## 2024-08-26 NOTE — Progress Notes (Signed)
 Established Patient Visit  Patient: Sandra Curtis   DOB: 12/11/1955   68 y.o. Female  MRN: [id number] Visit Date: 08/26/2024  Subjective:    Chief Complaint  Patient presents with   Follow-up    FASTING 6 month follow up for HTN. DM and Hyperlipidemia Wants flu vaccine Due for colonoscopy, Zoster vaccine  Requesting Eye Exam    HPI DM (diabetes mellitus) (HCC) Diet controlled Repeat hgbA1c-5.7% improved Normal UACr Requested eye exam report  Hyperlipidemia associated with type 2 diabetes mellitus (HCC) LDL not at goal  Current use of atorvastatin  40mg  3x/week Unable to tolerate high dose. Repeat labs in 3months (fasting)   Reviewed medical, surgical, and social history today  Medications: Outpatient Medications Prior to Visit  Medication Sig   acetaminophen  (TYLENOL ) 650 MG CR tablet Take 650 mg by mouth every 8 (eight) hours as needed for pain.   albuterol  (VENTOLIN  HFA) 108 (90 Base) MCG/ACT inhaler INHALE 1 TO 2 PUFFS INTO THE LUNGS EVERY 6 HOURS AS NEEDED   famotidine  (PEPCID ) 40 MG tablet Take 40 mg by mouth daily.   Nintedanib (OFEV ) 100 MG CAPS Take 1 capsule (100 mg total) by mouth 2 (two) times daily.   pregabalin  (LYRICA ) 50 MG capsule TAKE 1 CAPSULE(50 MG) BY MOUTH TWICE DAILY   sucralfate  (CARAFATE ) 1 g tablet TAKE 1 TABLET(1 GRAM) BY MOUTH FOUR TIMES DAILY AT BEDTIME WITH MEALS   [DISCONTINUED] atorvastatin  (LIPITOR) 40 MG tablet Take 1 tablet (40 mg total) by mouth 3 (three) times a week.   [DISCONTINUED] lansoprazole (PREVACID) 15 MG capsule Take 2 capsules (30 mg total) by mouth 2 (two) times daily before a meal.   [DISCONTINUED] benzonatate  (TESSALON ) 100 MG capsule Take 1 capsule (100 mg total) by mouth 3 (three) times daily as needed for cough. (Patient not taking: Reported on 08/26/2024)   [DISCONTINUED] predniSONE  (DELTASONE ) 20 MG tablet Take 2 tablets (40 mg total) by mouth daily with breakfast.   No facility-administered  medications prior to visit.   Reviewed past medical and social history.   ROS per HPI above      Objective:  BP 138/74 (BP Location: Left Arm, Patient Position: Sitting, Cuff Size: Large)   Pulse 77   Temp 98.1 F (36.7 C) (Oral)   Ht 5' (1.524 m)   Wt 135 lb 12.8 oz (61.6 kg)   SpO2 98%   BMI 26.52 kg/m      Physical Exam Vitals and nursing note reviewed.  Cardiovascular:     Rate and Rhythm: Normal rate and regular rhythm.     Pulses: Normal pulses.     Heart sounds: Normal heart sounds.  Pulmonary:     Effort: Pulmonary effort is normal.     Breath sounds: Normal breath sounds.  Musculoskeletal:     Right lower leg: No edema.     Left lower leg: No edema.  Neurological:     Mental Status: She is alert and oriented to person, place, and time.     Results for orders placed or performed in visit on 08/26/24  POCT glycosylated hemoglobin (Hb A1C)  Result Value Ref Range   Hemoglobin A1C 5.7 (A) 4.0 - 5.6 %   HbA1c POC (<> result, manual entry)     HbA1c, POC (prediabetic range)     HbA1c, POC (controlled diabetic range)        Assessment & Plan:    Problem  List Items Addressed This Visit     Atherosclerosis of aorta   Relevant Medications   atorvastatin  (LIPITOR) 40 MG tablet   DM (diabetes mellitus) (HCC)   Diet controlled Repeat hgbA1c-5.7% improved Normal UACr Requested eye exam report      Relevant Medications   atorvastatin  (LIPITOR) 40 MG tablet   Other Relevant Orders   POCT glycosylated hemoglobin (Hb A1C) (Completed)   Hyperlipidemia associated with type 2 diabetes mellitus (HCC)   LDL not at goal  Current use of atorvastatin  40mg  3x/week Unable to tolerate high dose. Repeat labs in 3months (fasting)      Relevant Medications   atorvastatin  (LIPITOR) 40 MG tablet   Stage 3a chronic kidney disease (HCC)   Other Visit Diagnoses       Need for influenza vaccination    -  Primary   Relevant Orders   Flu vaccine HIGH DOSE PF(Fluzone  Trivalent) (Completed)      Return in about 6 months (around 02/23/2025) for HTN, DM, hyperlipidemia (fasting).     Roselie Mood, NP

## 2024-08-26 NOTE — Patient Instructions (Addendum)
 Maintain Heart healthy diet and daily exercise. Maintain current medications. Schedule nurse visit for shingrix vaccine. hgbA1c at 5.7% controlled DIABETES.

## 2024-08-26 NOTE — Telephone Encounter (Unsigned)
 Copied from CRM #[id number]. Topic: Clinical - Medication Refill >> Aug 26, 2024  3:35 PM Leila C wrote: Most Recent Pulmonary Care Visit:  Provider: Dr. Praveen Mannam Department: [facility] Date: 08/15/24 Medication(s): Nintedanib (OFEV ) 100 MG CAPS take one capsule twice daily   Has the patient contacted their pharmacy? No, patient was unsure of the pharmacy to contact, since goes to a speciality pharmacy.  (Agent: If no, request that the patient contact the pharmacy for the refill. If patient does not wish to contact the pharmacy document the reason why and proceed with request.) (Agent: If yes, when and what did the pharmacy advise?)  This is the patient's preferred pharmacy:  [facility] - [place], [place] - [address] [place] [address] Phone: [phone] Fax: [phone]  Is this the correct pharmacy for this prescription? Could not confirmed with patient  If no, delete pharmacy and type the correct one.   Has the prescription been filled recently? No  Is the patient out of the medication? No, will be out only has five capsules left.   Has the patient been seen for an appointment in the last year OR does the patient have an upcoming appointment? Yes, 12/09/24  Can we respond through MyChart? No, please call back at [phone]  Agent: Please be advised that Rx refills may take up to 3 business days. We ask that you follow-up with your pharmacy.

## 2024-08-27 MED ORDER — OFEV 100 MG PO CAPS: 100.0000 mg | ORAL_CAPSULE | Freq: Two times a day (BID) | ORAL | 5 refills | Status: AC

## 2024-08-27 NOTE — Telephone Encounter (Signed)
 Refill sent for OFEV  to [facility] ([place] ): [phone]  Dose: 100mg  capsule by mouth twice daily   Last OV: 08/15/2024 Provider: Dr. Theophilus Pertinent labs: LFTs wnl 08/20/24  Next OV: 12/09/2024  Aleck Puls, PharmD, BCPS Clinical Pharmacist  [facility]

## 2024-09-19 ENCOUNTER — Other Ambulatory Visit: Payer: Self-pay | Admitting: Nurse Practitioner

## 2024-09-19 DIAGNOSIS — E1169 Type 2 diabetes mellitus with other specified complication: Secondary | ICD-10-CM

## 2024-09-19 DIAGNOSIS — I700 Atherosclerosis of aorta: Secondary | ICD-10-CM

## 2024-09-20 NOTE — Telephone Encounter (Signed)
 Refill responded to on 08/26/24

## 2024-09-26 ENCOUNTER — Inpatient Hospital Stay: Admission: RE | Admit: 2024-09-26

## 2024-09-26 DIAGNOSIS — K2100 Gastro-esophageal reflux disease with esophagitis, without bleeding: Secondary | ICD-10-CM

## 2024-09-26 DIAGNOSIS — K449 Diaphragmatic hernia without obstruction or gangrene: Secondary | ICD-10-CM

## 2024-09-26 LAB — OPHTHALMOLOGY REPORT-SCANNED

## 2024-11-15 ENCOUNTER — Ambulatory Visit: Payer: Self-pay | Admitting: General Surgery

## 2024-12-09 ENCOUNTER — Ambulatory Visit: Admitting: Pulmonary Disease

## 2024-12-13 ENCOUNTER — Ambulatory Visit (HOSPITAL_COMMUNITY): Admit: 2024-12-13 | Admitting: General Surgery

## 2025-01-24 ENCOUNTER — Ambulatory Visit

## 2025-02-24 ENCOUNTER — Ambulatory Visit: Admitting: Nurse Practitioner
